# Patient Record
Sex: Male | Born: 1940
Health system: Southern US, Community
[De-identification: ages and names within clinical notes are randomized; demographics above are authoritative.]

## PROBLEM LIST (undated history)

## (undated) DIAGNOSIS — C801 Malignant (primary) neoplasm, unspecified: Secondary | ICD-10-CM

## (undated) DIAGNOSIS — M1990 Unspecified osteoarthritis, unspecified site: Secondary | ICD-10-CM

## (undated) DIAGNOSIS — E785 Hyperlipidemia, unspecified: Secondary | ICD-10-CM

## (undated) DIAGNOSIS — E119 Type 2 diabetes mellitus without complications: Secondary | ICD-10-CM

## (undated) DIAGNOSIS — I10 Essential (primary) hypertension: Secondary | ICD-10-CM

## (undated) DIAGNOSIS — N400 Enlarged prostate without lower urinary tract symptoms: Secondary | ICD-10-CM

## (undated) HISTORY — PX: PAROTID GLAND TUMOR EXCISION: SHX5221

## (undated) HISTORY — DX: Benign prostatic hyperplasia without lower urinary tract symptoms: N40.0

## (undated) HISTORY — DX: Type 2 diabetes mellitus without complications: E11.9

## (undated) HISTORY — DX: Hyperlipidemia, unspecified: E78.5

## (undated) HISTORY — DX: Malignant (primary) neoplasm, unspecified: C80.1

## (undated) HISTORY — PX: HERNIA REPAIR: SHX51

## (undated) HISTORY — DX: Essential (primary) hypertension: I10

## (undated) HISTORY — PX: ROTATOR CUFF REPAIR: SHX139

---

## 1998-04-14 ENCOUNTER — Emergency Department (HOSPITAL_COMMUNITY): Admission: EM | Admit: 1998-04-14 | Discharge: 1998-04-14 | Payer: Self-pay | Admitting: *Deleted

## 1999-06-18 ENCOUNTER — Emergency Department (HOSPITAL_COMMUNITY): Admission: EM | Admit: 1999-06-18 | Discharge: 1999-06-18 | Payer: Self-pay | Admitting: Emergency Medicine

## 1999-06-18 ENCOUNTER — Encounter: Payer: Self-pay | Admitting: Emergency Medicine

## 2002-07-12 ENCOUNTER — Other Ambulatory Visit: Admission: RE | Admit: 2002-07-12 | Discharge: 2002-07-12 | Payer: Self-pay | Admitting: Otolaryngology

## 2002-08-02 ENCOUNTER — Encounter: Admission: RE | Admit: 2002-08-02 | Discharge: 2002-08-02 | Payer: Self-pay | Admitting: Otolaryngology

## 2002-08-02 ENCOUNTER — Encounter: Payer: Self-pay | Admitting: Otolaryngology

## 2002-08-24 ENCOUNTER — Inpatient Hospital Stay (HOSPITAL_COMMUNITY): Admission: RE | Admit: 2002-08-24 | Discharge: 2002-08-26 | Payer: Self-pay | Admitting: Otolaryngology

## 2002-08-24 ENCOUNTER — Encounter (INDEPENDENT_AMBULATORY_CARE_PROVIDER_SITE_OTHER): Payer: Self-pay | Admitting: *Deleted

## 2002-09-04 ENCOUNTER — Ambulatory Visit: Admission: RE | Admit: 2002-09-04 | Discharge: 2002-09-29 | Payer: Self-pay | Admitting: Radiation Oncology

## 2002-09-07 ENCOUNTER — Encounter: Admission: RE | Admit: 2002-09-07 | Discharge: 2002-09-07 | Payer: Self-pay | Admitting: Dentistry

## 2002-10-19 ENCOUNTER — Ambulatory Visit: Admission: RE | Admit: 2002-10-19 | Discharge: 2002-12-13 | Payer: Self-pay | Admitting: Radiation Oncology

## 2003-05-14 ENCOUNTER — Ambulatory Visit: Admission: RE | Admit: 2003-05-14 | Discharge: 2003-05-14 | Payer: Self-pay | Admitting: Radiation Oncology

## 2003-07-16 ENCOUNTER — Ambulatory Visit: Admission: RE | Admit: 2003-07-16 | Discharge: 2003-07-16 | Payer: Self-pay | Admitting: Radiation Oncology

## 2003-09-10 ENCOUNTER — Ambulatory Visit: Admission: RE | Admit: 2003-09-10 | Discharge: 2003-09-10 | Payer: Self-pay | Admitting: Radiation Oncology

## 2003-10-03 ENCOUNTER — Encounter: Admission: RE | Admit: 2003-10-03 | Discharge: 2003-10-03 | Payer: Self-pay | Admitting: Otolaryngology

## 2004-03-25 ENCOUNTER — Ambulatory Visit: Admission: RE | Admit: 2004-03-25 | Discharge: 2004-03-25 | Payer: Self-pay

## 2004-07-31 ENCOUNTER — Ambulatory Visit: Payer: Self-pay | Admitting: Dentistry

## 2004-09-04 ENCOUNTER — Encounter: Admission: RE | Admit: 2004-09-04 | Discharge: 2004-09-04 | Payer: Self-pay | Admitting: Otolaryngology

## 2005-04-10 ENCOUNTER — Ambulatory Visit: Payer: Self-pay | Admitting: Dentistry

## 2005-04-22 ENCOUNTER — Ambulatory Visit: Payer: Self-pay | Admitting: Dentistry

## 2005-06-09 ENCOUNTER — Ambulatory Visit (HOSPITAL_COMMUNITY): Admission: RE | Admit: 2005-06-09 | Discharge: 2005-06-09 | Payer: Self-pay | Admitting: Otolaryngology

## 2005-10-30 ENCOUNTER — Ambulatory Visit: Payer: Self-pay | Admitting: Dentistry

## 2006-02-01 ENCOUNTER — Ambulatory Visit: Payer: Self-pay | Admitting: Dentistry

## 2006-05-28 ENCOUNTER — Ambulatory Visit: Payer: Self-pay | Admitting: Dentistry

## 2006-12-10 ENCOUNTER — Ambulatory Visit: Payer: Self-pay | Admitting: Dentistry

## 2008-06-19 ENCOUNTER — Ambulatory Visit: Payer: Self-pay | Admitting: Dentistry

## 2008-08-13 ENCOUNTER — Ambulatory Visit: Payer: Self-pay | Admitting: Dentistry

## 2008-08-15 ENCOUNTER — Encounter: Payer: Self-pay | Admitting: Family Medicine

## 2008-08-22 ENCOUNTER — Ambulatory Visit: Payer: Self-pay | Admitting: Family Medicine

## 2008-10-19 ENCOUNTER — Ambulatory Visit: Payer: Self-pay | Admitting: Family Medicine

## 2008-10-19 DIAGNOSIS — I10 Essential (primary) hypertension: Secondary | ICD-10-CM

## 2008-10-19 DIAGNOSIS — J301 Allergic rhinitis due to pollen: Secondary | ICD-10-CM

## 2008-10-19 DIAGNOSIS — I159 Secondary hypertension, unspecified: Secondary | ICD-10-CM | POA: Insufficient documentation

## 2008-10-19 DIAGNOSIS — N401 Enlarged prostate with lower urinary tract symptoms: Secondary | ICD-10-CM

## 2008-10-19 DIAGNOSIS — N400 Enlarged prostate without lower urinary tract symptoms: Secondary | ICD-10-CM | POA: Insufficient documentation

## 2008-10-19 HISTORY — DX: Essential (primary) hypertension: I10

## 2008-10-29 ENCOUNTER — Ambulatory Visit: Payer: Self-pay | Admitting: Family Medicine

## 2008-10-29 ENCOUNTER — Encounter: Payer: Self-pay | Admitting: Family Medicine

## 2008-11-05 LAB — CONVERTED CEMR LAB
ALT: 14 units/L (ref 0–53)
AST: 16 units/L (ref 0–37)
Albumin: 4.2 g/dL (ref 3.5–5.2)
CO2: 23 meq/L (ref 19–32)
Calcium: 9.3 mg/dL (ref 8.4–10.5)
Chloride: 105 meq/L (ref 96–112)
Cholesterol: 220 mg/dL — ABNORMAL HIGH (ref 0–200)
Creatinine, Ser: 1.13 mg/dL (ref 0.40–1.50)
Hemoglobin: 14.6 g/dL (ref 13.0–17.0)
MCHC: 33.9 g/dL (ref 30.0–36.0)
MCV: 85 fL (ref 78.0–100.0)
Platelets: 161 10*3/uL (ref 150–400)
Potassium: 4.3 meq/L (ref 3.5–5.3)
RBC: 5.07 M/uL (ref 4.22–5.81)
Sodium: 141 meq/L (ref 135–145)
Total Protein: 7.2 g/dL (ref 6.0–8.3)

## 2008-11-20 ENCOUNTER — Ambulatory Visit: Payer: Self-pay | Admitting: Family Medicine

## 2008-11-20 DIAGNOSIS — M1710 Unilateral primary osteoarthritis, unspecified knee: Secondary | ICD-10-CM

## 2008-11-20 DIAGNOSIS — N529 Male erectile dysfunction, unspecified: Secondary | ICD-10-CM | POA: Insufficient documentation

## 2008-11-20 DIAGNOSIS — E785 Hyperlipidemia, unspecified: Secondary | ICD-10-CM | POA: Insufficient documentation

## 2008-11-20 DIAGNOSIS — M159 Polyosteoarthritis, unspecified: Secondary | ICD-10-CM | POA: Insufficient documentation

## 2008-12-04 ENCOUNTER — Encounter: Payer: Self-pay | Admitting: Family Medicine

## 2008-12-17 ENCOUNTER — Emergency Department (HOSPITAL_COMMUNITY): Admission: EM | Admit: 2008-12-17 | Discharge: 2008-12-17 | Payer: Self-pay | Admitting: Emergency Medicine

## 2009-01-28 ENCOUNTER — Encounter: Payer: Self-pay | Admitting: Family Medicine

## 2009-01-30 ENCOUNTER — Encounter: Payer: Self-pay | Admitting: Family Medicine

## 2009-10-16 ENCOUNTER — Ambulatory Visit: Payer: Self-pay | Admitting: Dentistry

## 2010-07-09 ENCOUNTER — Encounter (INDEPENDENT_AMBULATORY_CARE_PROVIDER_SITE_OTHER): Payer: Self-pay | Admitting: *Deleted

## 2010-07-17 NOTE — Letter (Signed)
Summary: Generic Letter  [facility]  [address]   [place], [place] [address]   Phone: [phone]  Fax: [phone]    07/09/2010  [address] [place], [place]  [address]  Dear Edward Newton,  We are happy to let you know that since you are covered under Medicare you are able to have a FREE visit at the [facility] to discuss your HEALTH. This is a new benefit for Medicare.  There will be no co-payment.  At this visit you will meet with Arlys John an expert in wellness and the health coach at our clinic.  At this visit we will discuss ways to keep you healthy and feeling well.  This visit will not replace your regular doctor visit and we cannot refill medications.     You will need to plan to be here at least one hour to talk about your medical history, your current status, review all of your medications, and discuss your future plans for your health.  This information will be entered into your record for your doctor to have and review.  If you are interested in staying healthy, this type of visit can help.  Please call the office at: [phone], to schedule a "Medicare Wellness Visit".  The day of the visit you should bring in all of your medications, including any vitamins, herbs, over the counter products you take.  Make a list of all the other doctors that you see, so we know who they are. If you have any other health documents please bring them.  We look forward to helping you stay healthy.  Sincerely,   [name]  iAWV

## 2010-09-21 LAB — URINALYSIS, ROUTINE W REFLEX MICROSCOPIC
Bilirubin Urine: NEGATIVE
Hgb urine dipstick: NEGATIVE
Ketones, ur: NEGATIVE mg/dL
Specific Gravity, Urine: 1.011 (ref 1.005–1.030)
pH: 6 (ref 5.0–8.0)

## 2010-09-21 LAB — BASIC METABOLIC PANEL
CO2: 29 mEq/L (ref 19–32)
Calcium: 9.2 mg/dL (ref 8.4–10.5)
Chloride: 106 mEq/L (ref 96–112)
GFR calc Af Amer: >60 mL/min (ref >60)
Glucose, Bld: 91 mg/dL (ref 70–99)
Potassium: 4.2 mEq/L (ref 3.5–5.1)
Sodium: 140 mEq/L (ref 135–145)

## 2010-09-21 LAB — DIFFERENTIAL
Basophils Absolute: 0 10*3/uL (ref 0.0–0.1)
Basophils Relative: 0 % (ref 0–1)
Eosinophils Relative: 8 % — ABNORMAL HIGH (ref 0–5)
Lymphocytes Relative: 9 % — ABNORMAL LOW (ref 12–46)
Monocytes Absolute: 0.6 10*3/uL (ref 0.1–1.0)
Monocytes Relative: 7 % (ref 3–12)
Neutro Abs: 6.5 10*3/uL (ref 1.7–7.7)

## 2010-09-21 LAB — CBC
HCT: 41.9 % (ref 39.0–52.0)
Hemoglobin: 13.7 g/dL (ref 13.0–17.0)
MCHC: 32.7 g/dL (ref 30.0–36.0)
RBC: 4.74 MIL/uL (ref 4.22–5.81)
RDW: 15.9 % — ABNORMAL HIGH (ref 11.5–15.5)

## 2010-09-21 LAB — URINE MICROSCOPIC-ADD ON

## 2010-10-31 NOTE — Op Note (Signed)
NAME:  Edward Newton, Edward Newton                       ACCOUNT NO.:  [id number]   MEDICAL RECORD NO.:  [id number]                   PATIENT TYPE:  INP   LOCATION:  3306                                 FACILITY:  [facility]   PHYSICIAN:  Lucky Cowboy, M.D.                    DATE OF BIRTH:  07-07-1940   DATE OF PROCEDURE:  08/24/2002  DATE OF DISCHARGE:                                 OPERATIVE REPORT   PREOPERATIVE DIAGNOSIS:  Left parotid mass.   POSTOPERATIVE DIAGNOSIS:  Left parotid high-grade mucoepidermoid carcinoma.   PROCEDURES:  1. Left total parotidectomy.  2. Left facial nerve (frontal branch) neurorrhaphy.  3. Left selective neck dissection (supraomohyoid neck dissection, zones I-     III).  4. Nerve integrity monitoring.   SURGEON:  Lucky Cowboy, M.D.   ASSISTANTS:  Gloris Manchester. Lazarus Salines, M.D., and Jeannett Senior. Pollyann Kennedy, M.D.   ESTIMATED BLOOD LOSS:  250 mL.   INDICATIONS:  This patient is a 70 year old male with approximately a four-  month history of a left preauricular mass.  Fine needle aspiration in the  office revealed evidence of carcinoma most consistent with high-grade  mucoepidermoid carcinoma.  For this reason, the above procedures are  performed.   FINDINGS:  The patient was noted to have multiple intraparotid masses but  none definitely (grossly) involving the deep lobe.  There were also multiple  nodes, particularly one overlying the jugular posterior to the digastric  muscle in zone II.  Frozen section revealed high-grade mucoepidermoid  carcinoma and for this reason, neck dissection was performed.   DESCRIPTION OF PROCEDURE:  The patient was taken to the operating room and  placed on the table in the supine position.  He was then placed under  general endotracheal anesthesia and the table rotated clockwise 90 degrees.  The nerve integrity monitor was applied to the orbicularis oculi and the  orbicularis oris.  The left face was prepped with Betadine and draped in the  usual sterile fashion.  A modified Blair incision was made, carrying it down  through the neck approximately three fingerbreadths inferior to the  mandible.  The incision was made with a #15 blade through the skin and  subcutaneous tissues.  Bovie electrocautery was used to divide the  subcutaneous tissues and to take off the posterior portion of the parotid  gland from the underlying ear cartilage.  Continuing dissection was  performed inferiorly, releasing the tail of the parotid from the  sternocleidomastoid muscle and also the digastric muscles.  Dissection  continued in this manner down to the tragal pointer and mastoid tip.  The  tympanic rings were used to identify the area of the facial nerve coming out  of the stylomastoid foramen.  It was then traced anteriorly using care to  cut tissue superior to this and care not to cut the nerves.  Nerve integrity  monitoring was used throughout this  portion of the procedure.  Each branch  was dissected carefully and the overlying superficial gland dissected off.  Extensive dissection was required, and there were multiple small branches  which occurred quite distally.  The entire gland was taken out in this  fashion.  The three quarter of the frontal branch was transected in trying  to get the superior portion of the gland freed.  This was reapproximated  using an 8-0 Prolene stitch to reapproximate the perineurium of this portion  of the nerve.  Gelfoam was placed over this for security to avoid any  disruption at the end of the procedure.   The deep lobe was then dissected.  Multiple vessels, including the internal  maxillary artery and vein, were divided and tied with 3-0 silk suture.  Silk  3-0 and 4-0 silk suture were used throughout the dissection of both the neck  and the parotid to ensure hemostasis.  Bipolar cautery was also used  significantly, particularly around the facial nerve, both at the main trunk  and branches.  The nerve  was then dissected off using a #15 blade from the  underlying deep lobe.  The deep lobe was then taken off the masseter muscle.  It was sent as a separate specimen.  As noted above, the primary tumor was  returned as high-grade mucoepidermoid carcinoma.   The neck flaps were then raised in a subplatysmal plane superiorly.  The  marginal mandibular nerve was traced out anteriorly and protected.  It was  separated off of the submandibular gland in this manner.  The submandibular  gland was divided from the inferior portion of the mandible using Bovie  cautery.  It was taken down inferiorly in this manner.  The mylohyoid muscle  was retracted anteriorly and the vascular pedicle divided.  The lingual  nerve was identified and cut inferior to Langley's ganglion.  The  hypoglossal nerve was identified and protected.  Wharton's duct was  identified, divided, and tied off.  The gland was thus separated and  included with the submental fat down to the underlying musculature.  Posteriorly, the fascia overlying the sternocleidomastoid muscle was  dissected anteriorly.  The spinal accessory nerve was identified and  protected.  The triangle right above the spinal accessory nerve was  dissected, and there was found to be an obvious node in this region.  The  overlying fascia was then taken down to the prevertebral musculature, down  to the carotid sheath.  Inferiorly, dissection continued off of the  posterior triangle down to the cervical cutaneous nerves.  Dissection was  just above this level down to the level of the omohyoid muscle.  Bovie  cautery was used for this.  The fascia overlying the carotid artery was  taken down using dissection and Bovie cautery.  A #10 blade was used to  release the overlying fascia from the internal jugular vein.  The vagus  nerves and phrenic nerves were identified and protected.  Anteriorly, the fascia and lymph nodes were taken off of the omohyoid muscle and  anterior  strap muscle.  Continued dissection occurred superiorly, releasing this from  the fascia overlying the larynx.  In this way, the left neck contents were  removed.  The wounds were copiously irrigated with normal saline, which was  suctioned out.  Gelfoam was placed over portions of the facial nerve.  A #15  Heide Spark drain was placed in the depths of the wound and secured to the  digastric muscle and  strap muscle anteriorly in a simple interrupted fashion  using 3-0 Vicryl.  It was then secured to the skin after making a separate  stab incision.  The secure portion was made in a pursestring fashion using 3-  0 nylon.  The drain was connected to suction.  The subcutaneous tissues were  reapproximated in simple interrupted buried fashion in the preauricular area  using 4-0 Vicryl.  The neck was closed by reapproximating the platysma in a  simple interrupted buried fashion using 3-0 Vicryl.  Inferiorly in the neck,  staples were used to close the skin, and superiorly in the parotid area the  skin was closed in a running fashion using 5-0 Prolene.  Bacitracin ointment  was applied.  The patient was awakened from anesthesia and taken to the  postanesthesia care unit in stable condition.                                               Lucky Cowboy, M.D.    SJ/MEDQ  D:  08/25/2002  T:  08/25/2002  Job:  [id number]   cc:   Dr. Claude Manges [facility]

## 2010-12-25 ENCOUNTER — Ambulatory Visit (HOSPITAL_COMMUNITY): Payer: Self-pay | Admitting: Dentistry

## 2010-12-25 DIAGNOSIS — Z85819 Personal history of malignant neoplasm of unspecified site of lip, oral cavity, and pharynx: Secondary | ICD-10-CM

## 2010-12-26 ENCOUNTER — Ambulatory Visit (INDEPENDENT_AMBULATORY_CARE_PROVIDER_SITE_OTHER): Payer: Self-pay | Admitting: Family Medicine

## 2010-12-26 ENCOUNTER — Encounter: Payer: Self-pay | Admitting: Family Medicine

## 2010-12-26 VITALS — BP 122/78 | HR 91 | Temp 98.2°F | Ht 69.0 in | Wt 179.9 lb

## 2010-12-26 DIAGNOSIS — R030 Elevated blood-pressure reading, without diagnosis of hypertension: Secondary | ICD-10-CM

## 2010-12-26 DIAGNOSIS — E7800 Pure hypercholesterolemia, unspecified: Secondary | ICD-10-CM

## 2010-12-26 DIAGNOSIS — N529 Male erectile dysfunction, unspecified: Secondary | ICD-10-CM

## 2010-12-26 DIAGNOSIS — Z23 Encounter for immunization: Secondary | ICD-10-CM

## 2010-12-26 MED ORDER — SILDENAFIL CITRATE 25 MG PO TABS: 25.0000 mg | ORAL_TABLET | ORAL | Status: DC | PRN

## 2010-12-26 NOTE — Assessment & Plan Note (Signed)
Recheck today.  Will send letter or call to discuss

## 2010-12-26 NOTE — Assessment & Plan Note (Signed)
Good today.  Will monitor

## 2010-12-26 NOTE — Assessment & Plan Note (Signed)
Healthy enough for sexual activity.  Refilled viagra.  Encouraged him to talk to his urologist about this as well.

## 2010-12-26 NOTE — Patient Instructions (Signed)
It was nice to meet you today!  Please make an appt for lab work in the AM Come without eating or drinking except for black coffee or water   I will send you a letter with the results unless we need to talk about them.    I have refilled your viagra.  Its at your pharmacy

## 2010-12-26 NOTE — Progress Notes (Signed)
  Subjective:    Patient ID: Edward [facility], male    DOB: Apr 13, 1941, 70 y.o.   MRN: [id number]  HPI  CPE- no concerns, no complaints.  Eating varied diet with vegetables and fruits, avoiding fried foods.  Exercise with walking and push ups.  No chest pain, DOE, SOB, HA, weight loss or fevers.  Can walk as far as he wants to.    Review of Systems See above    Objective:   Physical Exam Vital signs reviewed General appearance - alert, well appearing, and in no distress and oriented to person, place, and time Heart - normal rate, regular rhythm, normal S1, S2, no murmurs, rubs, clicks or gallops Chest - clear to auscultation, no wheezes, rales or rhonchi, symmetric air entry, no tachypnea, retractions or cyanosis Skin - normal coloration and turgor, no rashes, no suspicious skin lesions noted Eyes - pupils equal and reactive, extraocular eye movements intact, sclera anicteric, some cataracts seen on left eye Ears - bilateral TM's and external ear canals normal, right ear normal, left ear normal Nose - normal and patent, no erythema, discharge or polyps Face- small indentation near ear s/p surgery Abdomen - soft, nontender, nondistended, no masses or organomegaly Extremities - peripheral pulses normal, no pedal edema, no clubbing or cyanosis Right hand missing last three tips of fingers s/p work injury       Assessment & Plan:

## 2010-12-29 ENCOUNTER — Other Ambulatory Visit: Payer: BC Managed Care – PPO

## 2010-12-29 DIAGNOSIS — E7800 Pure hypercholesterolemia, unspecified: Secondary | ICD-10-CM

## 2010-12-29 LAB — COMPREHENSIVE METABOLIC PANEL
Alkaline Phosphatase: 52 U/L (ref 39–117)
BUN: 13 mg/dL (ref 6–23)
CO2: 24 mEq/L (ref 19–32)
Creat: 1.29 mg/dL (ref 0.50–1.35)
Glucose, Bld: 122 mg/dL — ABNORMAL HIGH (ref 70–99)
Total Bilirubin: 0.7 mg/dL (ref 0.3–1.2)

## 2010-12-29 LAB — LIPID PANEL
Cholesterol: 176 mg/dL (ref 0–200)
HDL: 41 mg/dL
LDL Cholesterol: 121 mg/dL — ABNORMAL HIGH (ref 0–99)
Total CHOL/HDL Ratio: 4.3 ratio
Triglycerides: 72 mg/dL
VLDL: 14 mg/dL (ref 0–40)

## 2010-12-29 NOTE — Progress Notes (Signed)
Cmp,flp done today [facility] Nyree Yonker

## 2010-12-30 ENCOUNTER — Encounter: Payer: Self-pay | Admitting: Family Medicine

## 2010-12-31 ENCOUNTER — Encounter: Payer: Self-pay | Admitting: Family Medicine

## 2011-01-09 ENCOUNTER — Encounter: Payer: Self-pay | Admitting: Home Health Services

## 2011-01-09 ENCOUNTER — Ambulatory Visit (INDEPENDENT_AMBULATORY_CARE_PROVIDER_SITE_OTHER): Payer: BC Managed Care – PPO | Admitting: Home Health Services

## 2011-01-09 VITALS — BP 157/94 | HR 78 | Temp 98.4°F | Ht 69.0 in | Wt 179.4 lb

## 2011-01-09 DIAGNOSIS — Z0000 Encounter for general adult medical examination without abnormal findings: Secondary | ICD-10-CM

## 2011-01-09 NOTE — Patient Instructions (Signed)
1. Continue to exercise 3-4 times a week for 30 minutes. 2. Continue to focus your diet on fruits and vegetables. 3. Schedule an eye exam. 4. Consider contacting pharmacy for shingles vaccine. 5. Continue to watch blood pressure and if it is consistently over 140/90, schedule an appointment with Dr. Hulen Luster.

## 2011-01-09 NOTE — Progress Notes (Signed)
Patient here for annual wellness visit, patient reports: Risk Factors/Conditions needing evaluation or treatment: Pt does not have risk factors that need evaluation. Home Safety: Pt lives with wife in 2 story home.  PT reports having smoke detectors but does not have adaptive equipment in bathroom.  Other Information: Corrective lens: Pt wears corrective lens for reading and visits eye doctor as needed. Dentures: Pt has full dentures on top and partial on the bottom. Memory: Pt denies memory problems. Patient's Mini Mental Score (recorded in doc. flowsheet): 30  Balance/Gait: Pt has no noticable impairment Balance Abnormal Patient value  Sitting balance    Sit to stand    Attempts to arise    Immediate standing balance    Standing balance    Nudge    Eyes closed- Romberg    Tandem stance    Back lean    Neck Rotation    360 degree turn    Sitting down     Gait Abnormal Patient value  Initiation of gait    Step length-left    Step length-right    Step height-left    Step height-right    Step symmetry    Step continuity    Path deviation    Trunk movement    Walking stance        Annual Wellness Visit Requirements Recorded Today In  Medical, family, social history Past Medical, Family, Social History Section  Current providers Care team  Current medications Medications  Wt, BP, Ht, BMI Vital signs  Hearing assessment (welcome visit) Hearing/vision  Tobacco, alcohol, illicit drug use History  ADL Nurse Assessment  Depression Screening Nurse Assessment  Cognitive impairment Nurse Assessment  Mini Mental Status Document Flowsheet  Fall Risk Nurse Assessment  Home Safety Progress Note  End of Life Planning (welcome visit) Social Documentation  Medicare preventative services Progress Note  Risk factors/conditions needing evaluation/treatment Progress Note  Personalized health advice Patient Instructions, goals, letter  Diet & Exercise Social Documentation    Emergency Contact Social Documentation  Seat Belts Social Documentation  Sun exposure/protection Social Documentation    Medicare Prevention Plan: Recommended pt  Contact pharmacy for shingles vaccine.  Recommended Medicare Prevention Screenings Men over 51 Test For Frequency Date of Last- BOLD if needed  Colorectal Cancer 1-10 yrs 2010- pt reported  Prostate Cancer Never or yearly Under care of urologist  Aortic Aneurysm Once if 65-75 with hx of smoking   Cholesterol 5 yrs 7/12  Diabetes yearly Non diabetic  HIV yearly declined  Influenza Shot yearly declined  Pneumonia Shot once 7/12  Zostavax Shot once recommended

## 2011-01-12 ENCOUNTER — Encounter: Payer: Self-pay | Admitting: Home Health Services

## 2011-01-12 NOTE — Progress Notes (Signed)
I have reviewed this visit and discussed with Suzanne Lineberry and agree with her documentation  

## 2011-04-22 ENCOUNTER — Encounter: Payer: Self-pay | Admitting: Home Health Services

## 2011-06-24 ENCOUNTER — Ambulatory Visit (HOSPITAL_COMMUNITY): Payer: BC Managed Care – PPO | Admitting: Dentistry

## 2011-08-03 ENCOUNTER — Encounter (HOSPITAL_COMMUNITY): Payer: Self-pay | Admitting: Dentistry

## 2011-08-03 ENCOUNTER — Ambulatory Visit (HOSPITAL_COMMUNITY): Payer: Self-pay | Admitting: Dentistry

## 2011-08-03 DIAGNOSIS — M264 Malocclusion, unspecified: Secondary | ICD-10-CM

## 2011-08-03 DIAGNOSIS — K121 Other forms of stomatitis: Secondary | ICD-10-CM

## 2011-08-03 DIAGNOSIS — M270 Developmental disorders of jaws: Secondary | ICD-10-CM

## 2011-08-03 DIAGNOSIS — K1370 Unspecified lesions of oral mucosa: Secondary | ICD-10-CM

## 2011-08-03 DIAGNOSIS — K08109 Complete loss of teeth, unspecified cause, unspecified class: Secondary | ICD-10-CM

## 2011-08-03 DIAGNOSIS — K089 Disorder of teeth and supporting structures, unspecified: Secondary | ICD-10-CM

## 2011-08-03 DIAGNOSIS — Z923 Personal history of irradiation: Secondary | ICD-10-CM

## 2011-08-03 DIAGNOSIS — K036 Deposits [accretions] on teeth: Secondary | ICD-10-CM

## 2011-08-03 DIAGNOSIS — Z09 Encounter for follow-up examination after completed treatment for conditions other than malignant neoplasm: Secondary | ICD-10-CM

## 2011-08-03 DIAGNOSIS — Z85819 Personal history of malignant neoplasm of unspecified site of lip, oral cavity, and pharynx: Secondary | ICD-10-CM

## 2011-08-03 DIAGNOSIS — K029 Dental caries, unspecified: Secondary | ICD-10-CM

## 2011-08-03 DIAGNOSIS — K000 Anodontia: Secondary | ICD-10-CM

## 2011-08-04 NOTE — Progress Notes (Signed)
Monday, August 03, 2011   BP: 117/74           P: 78            T: 97.5   Edward [facility] presents for periodic oral exam and adult prophylaxis. Past Medical History  Diagnosis Date  . Hyperlipidemia   . BPH (benign prostatic hyperplasia)   . Cancer     left parotid; S/P Left Total Parotidectomy with seelctive neck dissection 08/24/02-Dr. Gerilyn Pilgrim; S/P Radiation therapy 10/26/02 thru 12/11/02 6600cGy  Patient denies any recent health history changes. Patient sees Designer, fashion/clothing residents with [facility] on the [facility]. .. No Known Allergies Current Outpatient Prescriptions  Medication Sig Dispense Refill  . JALYN 0.5-0.4 MG CAPS       . sildenafil (VIAGRA) 25 MG tablet Take 1 tablet (25 mg total) by mouth as needed for erectile dysfunction. do not exceed more than one tablet a day.  10 tablet  11    C/C: "I have a sore spot on my lower gum".  HPI: Patient indicates that he recently last lost his lower partial denture. He has been eating with the upper denture against his lower teeth and and now has a sore spot on the lower left side in the back where the denture hits.  DENTAL EXAM  General: Patient is a well-developed, well-nourished male in no acute distress. Vitals: As above. Extraoral Exam: Left neck is consistent with previous neck dissection and parotidectomy. There is no right neck lymphadenopathy. The patient denies any acute TMJ symptoms. Intraoral Exam: Xerostomia. There is a soft tissue ulceration in the area of trauma from the upper denture in the left retromolar pad area. Patient has bilateral mandibular lingual tori. Dentition: Patient has tooth numbers 22 through 29 remaining. Caries: Patient has caries involving tooth numbers 22 through 27.  Please see dental charting form. Patient has significant facial and lingual abfraction lesions. Periodontal; chronic periodontal disease with accretions. Adult prophylaxis performed with KaVo Sonic scaler and hand curettes.  Oral Hygiene instructions provided. Endodontic: Patient denies acute pulpitis symptoms . There are no obvious periapical radiolucencies noted. C&B: There are no crown restorations. Prosthodontic: Patient has an upper complete denture that is acceptable. The tuberosity areas of the upper denture are traumatizing the retromolar pad area due to loss of the recent lower partial denture. Pressure indicating paste was applied and the denture was adjusted as indicated to avoid trauma to these lower left and lower right retromolar pad areas. Areas were polished. Patient accepted results. Occlusion: The patient has a less than ideal occlusion at this time. Radiographic interpretation:(panoramic radiograph and lower Periapicals were obtained) Multiple dental caries are noted. Multiple missing teeth are noted. No obvious periapical radiolucencies are noted.  Assessments: 1. Traumatic ulceration of lower left retromolar pad area. 2. Chronic periodontitis 3. Accretions 4. Dental caries 5. Xerostomia 6. Bilateral mandibular lingual tori 7. Decreased maximum interincisal opening 8. Loss of mandibular partial denture 9. Poor occlusal scheme 10. Extensive facial abfraction lesions 11. Extensive lingual abfraction/erosion lesions 12. History of previous radiation therapy with risk for osteonecrosis to the radiation therapy areas.  Plan/Recommendations: 1. I discussed the risks, benefits, and complications of various treatment options with the patient in relationship to his medical and dental conditions and previous radiation therapy. We discussed extraction of remaining teeth with alveoloplasty and pre-prosthetic surgery as indicated. With this previous radiation therapy, the patient would most likely be at risk for osteoradionecrosis when tori reductions are performed. I will, however,  review this with Dr. Dayton Scrape to see if the tori are in the field of radiation therapy. We then discussed fabrication of a  mandibular partial denture after caries excavations. Patient is aware of the possible need for root canal therapies that may need to be performed if caries extend into the pulps. Patient is currently thinking about his options at this time and will return for discussion of final treatment options in approximately 2 weeks. 2. Discussion of findings with Dr. Chipper Herb as indicated. Dr. Kristin Bruins

## 2011-08-14 ENCOUNTER — Encounter (HOSPITAL_COMMUNITY): Payer: Self-pay | Admitting: Dentistry

## 2011-08-17 ENCOUNTER — Ambulatory Visit (HOSPITAL_COMMUNITY): Payer: Self-pay | Admitting: Dentistry

## 2011-08-17 DIAGNOSIS — M264 Malocclusion, unspecified: Secondary | ICD-10-CM

## 2011-08-17 DIAGNOSIS — K029 Dental caries, unspecified: Secondary | ICD-10-CM

## 2011-08-17 DIAGNOSIS — K117 Disturbances of salivary secretion: Secondary | ICD-10-CM

## 2011-08-17 DIAGNOSIS — R252 Cramp and spasm: Secondary | ICD-10-CM

## 2011-08-17 DIAGNOSIS — K000 Anodontia: Secondary | ICD-10-CM

## 2011-08-17 DIAGNOSIS — K08109 Complete loss of teeth, unspecified cause, unspecified class: Secondary | ICD-10-CM

## 2011-08-17 NOTE — Progress Notes (Signed)
Monday, August 17, 2011   BP: 116/76            P:  82           T: 97.8  [name] presents for discussion of treatment options and initiation of treatment as time permits. We had an extensive discussion on full mouth extractions with alveoloplasty and pre-prosthetic surgery and potential risks for osteoradionecrosis. I had reviewed ports with Dr. Dayton Scrape who felt that risks for ORN should be minimal but no guarantees would be able to be provided. We discussed caries excavations, possible root canal therapies, selective extractions of 23-26,  and poor prognosis for successful lower partial fabrication due to the decreased vertical dimension and presence of mandibular tori. Patient currently wishes to proceed with caries excavations and dental restorations and subsequent discussion of partial denture fabrication after that phase of treatment.  Procedure: 36 mg Lidocaine with epi .018 mg via mental nerve block and infiltration. Z6109 #22 MF/Resin AE, Primer, DBA, Tetric Ceram EVO Shade A4. [place]. U0454 #23 L/Resin AE,Primer, DBA, Tetric Ceram EVO Shade A4. [place]. U9811 #23 DL/Resin AE, Primer, DBA, Tetric Ceram EVO Shade A4. [place]. [id number] #24 L/Resin AE,Primer, DBA, Tetric Ceram EVO Shade A4. [place]. Mandibular alginate impression. Lab pour. Patient tolerated procedure well. RTC for resins on tooth #'s 24-27 next time. Call if problems arise.  Dr. Cindra Eves

## 2011-08-31 ENCOUNTER — Ambulatory Visit (HOSPITAL_COMMUNITY): Payer: Self-pay | Admitting: Dentistry

## 2011-08-31 ENCOUNTER — Encounter: Payer: Self-pay | Admitting: Family Medicine

## 2011-08-31 ENCOUNTER — Ambulatory Visit (INDEPENDENT_AMBULATORY_CARE_PROVIDER_SITE_OTHER): Payer: BC Managed Care – PPO | Admitting: Family Medicine

## 2011-08-31 ENCOUNTER — Encounter (HOSPITAL_COMMUNITY): Payer: Self-pay | Admitting: Dentistry

## 2011-08-31 ENCOUNTER — Other Ambulatory Visit: Payer: Self-pay | Admitting: Family Medicine

## 2011-08-31 VITALS — BP 138/90 | HR 84 | Temp 98.4°F | Ht 69.0 in | Wt 186.0 lb

## 2011-08-31 DIAGNOSIS — K08109 Complete loss of teeth, unspecified cause, unspecified class: Secondary | ICD-10-CM

## 2011-08-31 DIAGNOSIS — N529 Male erectile dysfunction, unspecified: Secondary | ICD-10-CM

## 2011-08-31 DIAGNOSIS — K117 Disturbances of salivary secretion: Secondary | ICD-10-CM

## 2011-08-31 DIAGNOSIS — M264 Malocclusion, unspecified: Secondary | ICD-10-CM

## 2011-08-31 DIAGNOSIS — K029 Dental caries, unspecified: Secondary | ICD-10-CM

## 2011-08-31 NOTE — Progress Notes (Signed)
  Subjective:    Patient ID: Edward Newton, male    DOB: 09-04-1940, 71 y.o.   MRN: [id number]  HPI Patient here to talk about erectile dysfunction. He is currently taking Jalyn with his urologist.  He tried a low dose of Viagra several times about 8 months ago and this was ineffective. He denied any shortness of breath, chest pain, dizziness with this. He is going to see his urologist on Wednesday of this week. He has been unable to have intercourse since last year.  Review of Systems See above    Objective:   Physical Exam Vital signs reviewed General appearance - alert, well appearing, and in no distress and oriented to person, place, and time        Assessment & Plan:

## 2011-08-31 NOTE — Progress Notes (Signed)
Monday, August 31, 2011  BP: 149/84       P: 87           T: 98.2  Edward Newton presents for continued dental restorations. Patient denies any toothache symptoms from last dental treatment.   Procedure: 36 mg Lidocaine with epi .018 mg via mental nerve block and infiltration area #25, 26 and 27. E4540 #27 DF/Resin AE, Primer, DBA, Tetric EvoCeram Bulk fill IV A. Deep caries on distal and wanted self-cure in proximal box area. Contour and [place]. J8119 #26 L/Resin AE,Primer, DBA, Tetric EvoCeram A 4.0. Contour and polish. J4782 #24 L/Resin AE,Primer, DBA, Tetric Evoceram Shade A 4.0 Contour and [place]. Patient tolerated procedure well. Discussed possibility of future acute pulpitis symptoms and need for either extraction of root canal therapy. Patient to call if symptoms arise. Discussed poor prognosis for partial denture fabrication. Patient wants to proceed without pre-prosthetic surgery however. RTC for partial denture mouth modifications and impressions in three weeks if no toothache symptoms. Patient dismissed in stable condition and is to call as needed.   Dr. Cindra Eves

## 2011-08-31 NOTE — Patient Instructions (Signed)
There are many medicines and other treatments for this that your urologist can help you with Please stop taking the Viagra and see your urologist on Wednesday.  Please come back and see [place] in a few months for your physical

## 2011-08-31 NOTE — Assessment & Plan Note (Signed)
Patient has not been taking Viagra. He would like to have further treatments for erectile dysfunction. I asked him to talk about this with his urologist on Wednesday. If his urologist would like me to prescribe the medicine, he just needs to call with the information.

## 2012-01-12 ENCOUNTER — Encounter: Payer: Self-pay | Admitting: Home Health Services

## 2012-07-06 ENCOUNTER — Ambulatory Visit (INDEPENDENT_AMBULATORY_CARE_PROVIDER_SITE_OTHER): Payer: Medicare Other | Admitting: Family Medicine

## 2012-07-06 ENCOUNTER — Encounter: Payer: Self-pay | Admitting: Family Medicine

## 2012-07-06 VITALS — BP 168/94 | HR 91 | Temp 98.4°F | Ht 69.0 in | Wt 186.0 lb

## 2012-07-06 DIAGNOSIS — N529 Male erectile dysfunction, unspecified: Secondary | ICD-10-CM

## 2012-07-06 DIAGNOSIS — E785 Hyperlipidemia, unspecified: Secondary | ICD-10-CM

## 2012-07-06 DIAGNOSIS — N401 Enlarged prostate with lower urinary tract symptoms: Secondary | ICD-10-CM

## 2012-07-06 DIAGNOSIS — R030 Elevated blood-pressure reading, without diagnosis of hypertension: Secondary | ICD-10-CM

## 2012-07-06 DIAGNOSIS — H9190 Unspecified hearing loss, unspecified ear: Secondary | ICD-10-CM

## 2012-07-06 DIAGNOSIS — E7800 Pure hypercholesterolemia, unspecified: Secondary | ICD-10-CM

## 2012-07-06 HISTORY — DX: Unspecified hearing loss, unspecified ear: H91.90

## 2012-07-06 MED ORDER — TAMSULOSIN HCL 0.4 MG PO CAPS: 0.4000 mg | ORAL_CAPSULE | Freq: Every day | ORAL | Status: DC

## 2012-07-06 MED ORDER — TADALAFIL 20 MG PO TABS: 10.0000 mg | ORAL_TABLET | ORAL | Status: DC | PRN

## 2012-07-06 MED ORDER — FINASTERIDE 5 MG PO TABS: 5.0000 mg | ORAL_TABLET | Freq: Every day | ORAL | Status: DC

## 2012-07-06 NOTE — Assessment & Plan Note (Signed)
Viagra did not help. Try Cialis. Consider referring for physical therapy at Urology if he is interested and if Cialis does not help.

## 2012-07-06 NOTE — Assessment & Plan Note (Signed)
His blood pressure is elevated today. I advised he check periodically at the pharmacy and if consistently elevated, make an appointment with Dr. Raymondo Band for ambulatory blood pressure monitoring.

## 2012-07-06 NOTE — Progress Notes (Signed)
  Subjective:    Patient ID: Edward Newton, male    DOB: 06/05/1941, 72 y.o.   MRN: [id number]  HPI # Annual physical Denies blood in stool. Last colonoscopy 3-4 years ago, told normal  # BPH He is followed at [facility]  # Elevated blood pressures ROS: denies chest pain, difficulty breathing  # Erectile dysfunction He has difficulty "getting hard". Started 6 months ago Viagra did not help   # Problems hearing out of his left ear He had surgery and cancer removed anterior to his left ear a long time ago (?partoid gland) including radiation  He has noticed difficulty hearing out of that ear recently  Review of Systems Per HPI Denies constipation Denies leg swelling Endorses nasal congestion; denies fevers/chills     Objective:   Physical Exam Gen: NAD; well-appearing, -nourished PSYCH: pleasant, engaged and normally conversant, appropriate to questions, alert and oriented CV: RRR, normal S1/S2, no m/r/g PULM: NI WOB; CTAB without w/r/r EAR: TM normal on right, left could not be visualized due to ear wax that is partially obstructing canal; canals are non-tender NOSE: no rhinorrhea HEAD: he is missing parotid gland on left side; skin is non-tender, -erythematous, -warm NECK: no LAD EXT: no edema    Assessment & Plan:

## 2012-07-06 NOTE — Assessment & Plan Note (Signed)
Lab visit for cholesterol test

## 2012-07-06 NOTE — Patient Instructions (Addendum)
We will refer you to an audiologist If you don't hear from anyone in 1 week regarding this, please call our clinic and let me know.   Try Cialis to help with intercourse If this does not work, and you would like to try physical therapy at [facility], please let me know.   Make a lab appointment to check your cholesterol   Please get your blood pressure checked at the pharmacy periodically.  If consistently > 160/90, please make an appointment with Dr. Raymondo Band at our clinic for ambulatory blood pressure monitoring.   I hope you enjoy your retirement. It was nice to meet you today.  Follow-up in 1 year.

## 2012-07-06 NOTE — Assessment & Plan Note (Signed)
He is followed by Dr. Laverle Patter at [facility] and being treated medically and being seen every 6 months, next appointment June 2014.

## 2012-07-06 NOTE — Assessment & Plan Note (Signed)
Refer to audiology. 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

## 2012-07-12 ENCOUNTER — Other Ambulatory Visit: Payer: Medicare Other

## 2012-07-12 ENCOUNTER — Telehealth: Payer: Self-pay | Admitting: Family Medicine

## 2012-07-12 DIAGNOSIS — E785 Hyperlipidemia, unspecified: Secondary | ICD-10-CM

## 2012-07-12 LAB — LIPID PANEL
Cholesterol: 199 mg/dL (ref 0–200)
HDL: 43 mg/dL (ref >39)
Total CHOL/HDL Ratio: 4.6 Ratio
Triglycerides: 73 mg/dL (ref <150)
VLDL: 15 mg/dL (ref 0–40)

## 2012-07-12 NOTE — Progress Notes (Signed)
FLP DONE TODAY Edward Newton 

## 2012-07-12 NOTE — Telephone Encounter (Signed)
Telephone. Framingham risk 21%. His LDL is 141, borderline high. His risk factors include age and hypertension.  We discussed risks and benefits of starting statin.  -He will try lifestyle modification first. Re-check in 6 months.  -He is not on anti-hypertensives. He was asked to schedule nurse visit next week for BP check. If remains elevated, consider starting anti-hypertensive. I will call and discuss after BP check.

## 2012-07-19 ENCOUNTER — Ambulatory Visit (INDEPENDENT_AMBULATORY_CARE_PROVIDER_SITE_OTHER): Payer: Medicare Other | Admitting: *Deleted

## 2012-07-19 DIAGNOSIS — R030 Elevated blood-pressure reading, without diagnosis of hypertension: Secondary | ICD-10-CM

## 2012-07-19 NOTE — Progress Notes (Signed)
Patient office for BP check as directed. BP checked manually using regular adult cuff.   BP LA 160/82 and RA 166/88 pulse 86. States he is feeling well today . Will forward message to Dr. Madolyn Frieze .     Pharmacy is [facility]

## 2012-07-21 ENCOUNTER — Telehealth: Payer: Self-pay | Admitting: Family Medicine

## 2012-07-21 DIAGNOSIS — I10 Essential (primary) hypertension: Secondary | ICD-10-CM

## 2012-07-21 MED ORDER — LISINOPRIL 10 MG PO TABS: 10.0000 mg | ORAL_TABLET | Freq: Every day | ORAL | Status: DC

## 2012-07-21 NOTE — Telephone Encounter (Signed)
Message copied by [facility], Etta Quill on Thu Jul 21, 2012  3:27 PM ------      Message from: Salomon Mast      Created: Tue Jul 19, 2012 11:11 AM       Patient in for BP check today

## 2012-07-21 NOTE — Assessment & Plan Note (Signed)
Patient's blood pressure remains elevated at nurse blood pressure check. Discussed with patient. We will start anti-hypertensive. Lisinopril. Discussed side effects (notably angioedema). Follow-up in 1 month.

## 2012-07-26 ENCOUNTER — Telehealth: Payer: Self-pay | Admitting: Family Medicine

## 2012-07-26 MED ORDER — HYDROCHLOROTHIAZIDE 25 MG PO TABS: ORAL_TABLET | ORAL | Status: DC

## 2012-07-26 NOTE — Telephone Encounter (Signed)
His lips got really swollen after several days of taking lisinopril. He has stopped and his lips are now normal.   Will add to allergy list.   Change to HCTZ. Take 1/2 tablet in the AM for 3 days. If you tolerate it without lightheadedness or other problem, increase to 1 full tablet.  Follow-up in 2 weeks.

## 2012-07-26 NOTE — Telephone Encounter (Signed)
Patient is calling because he was just started on Lisinopril last week and he took for a few days, but then it caused his lips to swell so he has stopped taking it for the time being.  He needs to know what he needs to do.

## 2012-07-26 NOTE — Telephone Encounter (Signed)
MD in clinic this afternoon.  Will forward to her. [place], Maryjo Rochester

## 2012-08-23 ENCOUNTER — Ambulatory Visit (INDEPENDENT_AMBULATORY_CARE_PROVIDER_SITE_OTHER): Payer: Medicare Other | Admitting: Family Medicine

## 2012-08-23 ENCOUNTER — Encounter: Payer: Self-pay | Admitting: Family Medicine

## 2012-08-23 ENCOUNTER — Other Ambulatory Visit: Payer: Self-pay | Admitting: *Deleted

## 2012-08-23 VITALS — BP 120/78 | HR 92 | Temp 98.5°F | Ht 69.0 in | Wt 182.0 lb

## 2012-08-23 DIAGNOSIS — I10 Essential (primary) hypertension: Secondary | ICD-10-CM

## 2012-08-23 MED ORDER — HYDROCHLOROTHIAZIDE 25 MG PO TABS: 25.0000 mg | ORAL_TABLET | Freq: Every day | ORAL | Status: DC

## 2012-08-23 NOTE — Patient Instructions (Addendum)
Continue your blood pressure medicine   Follow-up in 6 months for blood pressure   If your lab results are normal, I will send you a letter with the results. If abnormal, someone at the clinic will get in touch with you.

## 2012-08-23 NOTE — Assessment & Plan Note (Signed)
Controlled on HCTZ 25 mg qd. Continue. Will check Cr, electrolytes today.

## 2012-08-23 NOTE — Progress Notes (Signed)
  Subjective:    Patient ID: Edward Newton, male    DOB: 1941/05/11, 72 y.o.   MRN: [id number]  HPI # Hypertension  Compliant with HCTZ ROS: denies muscle aches, urinary frequency  Review of Systems Per HPI Denies chest pain, dyspnea    Objective:   Physical Exam GEN: NAD PULM: NI WOB EXT: no pitting edema     Assessment & Plan:

## 2012-08-24 LAB — BASIC METABOLIC PANEL
BUN: 16 mg/dL (ref 6–23)
Calcium: 10 mg/dL (ref 8.4–10.5)
Chloride: 104 mEq/L (ref 96–112)
Creat: 1.23 mg/dL (ref 0.50–1.35)

## 2012-08-27 ENCOUNTER — Encounter: Payer: Self-pay | Admitting: Family Medicine

## 2012-12-26 ENCOUNTER — Ambulatory Visit (INDEPENDENT_AMBULATORY_CARE_PROVIDER_SITE_OTHER): Payer: Medicare Other | Admitting: Family Medicine

## 2012-12-26 ENCOUNTER — Encounter: Payer: Self-pay | Admitting: Family Medicine

## 2012-12-26 VITALS — BP 133/83 | HR 88 | Temp 98.1°F | Ht 69.0 in | Wt 180.0 lb

## 2012-12-26 DIAGNOSIS — M25519 Pain in unspecified shoulder: Secondary | ICD-10-CM

## 2012-12-26 NOTE — Progress Notes (Signed)
  Subjective:    Patient ID: Edward Newton, male    DOB: 1940/07/29, 72 y.o.   MRN: [id number]  HPI 72 y.o. male with history of hypertension, hypercholesterolemia, BPH.  # Shoulder pain - Both shoulders, left worse than right - Right shoulder is s/p rotator cuff repair - This has been occurring for a long time, worse at night - Takes Aleve 2 to 3 times a week, takes away most of the pain   Review of Systems  ROS negative except as above    Objective:   Physical Exam Filed Vitals:   12/26/12 1505  BP: 133/83  Pulse: 88  Temp: 98.1 F (36.7 C)   General: NAD CV: RRR, normal s1/s2, no M/R/G Resp: CTAB, good effort Extremities:  Strength: 5/5 shoulder abduction, adduction, internal and external rotation  ROM: grossly intact  Empty can test positive on left       Assessment & Plan:  See problem list documentation

## 2012-12-26 NOTE — Assessment & Plan Note (Signed)
Bilateral, worse in left - Likely some degree of impingement syndrome - Recommend RICE, try to switch to Tylenol since he has mildly elevated creatinine - He will schedule an appt if he wants to try physical therapy or joint injection

## 2012-12-26 NOTE — Patient Instructions (Addendum)
Take Aleve Sparingly, Tylenol may be beneficial Please schedule an appointment if you wish to see Physical therapy or try joint injection.   RICE: Routine Care for Injuries The routine care of many injuries includes Rest, Ice, Compression, and Elevation (RICE). HOME CARE INSTRUCTIONS  Rest is needed to allow your body to heal. Routine activities can usually be resumed when comfortable. Injured tendons and bones can take up to 6 weeks to heal. Tendons are the cord-like structures that attach muscle to bone.  Ice following an injury helps keep the swelling down and reduces pain.  Put ice in a plastic bag.  Place a towel between your skin and the bag.  Leave the ice on for 15-20 minutes, 3-4 times a day. Do this while awake, for the first 24 to 48 hours. After that, continue as directed by your caregiver.  Compression helps keep swelling down. It also gives support and helps with discomfort. If an elastic bandage has been applied, it should be removed and reapplied every 3 to 4 hours. It should not be applied tightly, but firmly enough to keep swelling down. Watch fingers or toes for swelling, bluish discoloration, coldness, numbness, or excessive pain. If any of these problems occur, remove the bandage and reapply loosely. Contact your caregiver if these problems continue.  Elevation helps reduce swelling and decreases pain. With extremities, such as the arms, hands, legs, and feet, the injured area should be placed near or above the level of the heart, if possible. SEEK IMMEDIATE MEDICAL CARE IF:  You have persistent pain and swelling.  You develop redness, numbness, or unexpected weakness.  Your symptoms are getting worse rather than improving after several days. These symptoms may indicate that further evaluation or further X-rays are needed. Sometimes, X-rays may not show a small broken bone (fracture) until 1 week or 10 days later. Make a follow-up appointment with your caregiver. Ask  when your X-ray results will be ready. Make sure you get your X-ray results. Document Released: 09/13/2000 Document Revised: 08/24/2011 Document Reviewed: 10/31/2010 [facility] Patient Information 2014 [place], [place].  Impingement Syndrome, Rotator Cuff, Bursitis with Rehab Impingement syndrome is a condition that involves inflammation of the tendons of the rotator cuff and the subacromial bursa, that causes pain in the shoulder. The rotator cuff consists of four tendons and muscles that control much of the shoulder and upper arm function. The subacromial bursa is a fluid filled sac that helps reduce friction between the rotator cuff and one of the bones of the shoulder (acromion). Impingement syndrome is usually an overuse injury that causes swelling of the bursa (bursitis), swelling of the tendon (tendonitis), and/or a tear of the tendon (strain). Strains are classified into three categories. Grade 1 strains cause pain, but the tendon is not lengthened. Grade 2 strains include a lengthened ligament, due to the ligament being stretched or partially ruptured. With grade 2 strains there is still function, although the function may be decreased. Grade 3 strains include a complete tear of the tendon or muscle, and function is usually impaired. SYMPTOMS   Pain around the shoulder, often at the outer portion of the upper arm.  Pain that gets worse with shoulder function, especially when reaching overhead or lifting.  Sometimes, aching when not using the arm.  Pain that wakes you up at night.  Sometimes, tenderness, swelling, warmth, or redness over the affected area.  Loss of strength.  Limited motion of the shoulder, especially reaching behind the back (to the back pocket or  to unhook bra) or across your body.  Crackling sound (crepitation) when moving the arm.  Biceps tendon pain and inflammation (in the front of the shoulder). Worse when bending the elbow or lifting. CAUSES  Impingement  syndrome is often an overuse injury, in which chronic (repetitive) motions cause the tendons or bursa to become inflamed. A strain occurs when a force is paced on the tendon or muscle that is greater than it can withstand. Common mechanisms of injury include: Stress from sudden increase in duration, frequency, or intensity of training.  Direct hit (trauma) to the shoulder.  Aging, erosion of the tendon with normal use.  Bony bump on shoulder (acromial spur). RISK INCREASES WITH:  Contact sports (football, wrestling, boxing).  Throwing sports (baseball, tennis, volleyball).  Weightlifting and bodybuilding.  Heavy labor.  Previous injury to the rotator cuff, including impingement.  Poor shoulder strength and flexibility.  Failure to warm up properly before activity.  Inadequate protective equipment.  Old age.  Bony bump on shoulder (acromial spur). PREVENTION   Warm up and stretch properly before activity.  Allow for adequate recovery between workouts.  Maintain physical fitness:  Strength, flexibility, and endurance.  Cardiovascular fitness.  Learn and use proper exercise technique. PROGNOSIS  If treated properly, impingement syndrome usually goes away within 6 weeks. Sometimes surgery is required.  RELATED COMPLICATIONS   Longer healing time if not properly treated, or if not given enough time to heal.  Recurring symptoms, that result in a chronic condition.  Shoulder stiffness, frozen shoulder, or loss of motion.  Rotator cuff tendon tear.  Recurring symptoms, especially if activity is resumed too soon, with overuse, with a direct blow, or when using poor technique. TREATMENT  Treatment first involves the use of ice and medicine, to reduce pain and inflammation. The use of strengthening and stretching exercises may help reduce pain with activity. These exercises may be performed at home or with a therapist. If non-surgical treatment is unsuccessful after more  than 6 months, surgery may be advised. After surgery and rehabilitation, activity is usually possible in 3 months.  MEDICATION  If pain medicine is needed, nonsteroidal anti-inflammatory medicines (aspirin and ibuprofen), or other minor pain relievers (acetaminophen), are often advised.  Do not take pain medicine for 7 days before surgery.  Prescription pain relievers may be given, if your caregiver thinks they are needed. Use only as directed and only as much as you need.  Corticosteroid injections may be given by your caregiver. These injections should be reserved for the most serious cases, because they may only be given a certain number of times. HEAT AND COLD  Cold treatment (icing) should be applied for 10 to 15 minutes every 2 to 3 hours for inflammation and pain, and immediately after activity that aggravates your symptoms. Use ice packs or an ice massage.  Heat treatment may be used before performing stretching and strengthening activities prescribed by your caregiver, physical therapist, or athletic trainer. Use a heat pack or a warm water soak. SEEK MEDICAL CARE IF:   Symptoms get worse or do not improve in 4 to 6 weeks, despite treatment.  New, unexplained symptoms develop. (Drugs used in treatment may produce side effects.) EXERCISES  RANGE OF MOTION (ROM) AND STRETCHING EXERCISES - Impingement Syndrome (Rotator Cuff  Tendinitis, Bursitis) These exercises may help you when beginning to rehabilitate your injury. Your symptoms may go away with or without further involvement from your physician, physical therapist or athletic trainer. While completing these exercises, remember:  Restoring tissue flexibility helps normal motion to return to the joints. This allows healthier, less painful movement and activity.  An effective stretch should be held for at least 30 seconds.  A stretch should never be painful. You should only feel a gentle lengthening or release in the stretched  tissue. STRETCH  Flexion, Standing  Stand with good posture. With an underhand grip on your right / left hand, and an overhand grip on the opposite hand, grasp a broomstick or cane so that your hands are a little more than shoulder width apart.  Keeping your right / left elbow straight and shoulder muscles relaxed, push the stick with your opposite hand, to raise your right / left arm in front of your body and then overhead. Raise your arm until you feel a stretch in your right / left shoulder, but before you have increased shoulder pain.  Try to avoid shrugging your right / left shoulder as your arm rises, by keeping your shoulder blade tucked down and toward your mid-back spine. Hold for __________ seconds.  Slowly return to the starting position. Repeat __________ times. Complete this exercise __________ times per day. STRETCH  Abduction, Supine  Lie on your back. With an underhand grip on your right / left hand and an overhand grip on the opposite hand, grasp a broomstick or cane so that your hands are a little more than shoulder width apart.  Keeping your right / left elbow straight and your shoulder muscles relaxed, push the stick with your opposite hand, to raise your right / left arm out to the side of your body and then overhead. Raise your arm until you feel a stretch in your right / left shoulder, but before you have increased shoulder pain.  Try to avoid shrugging your right / left shoulder as your arm rises, by keeping your shoulder blade tucked down and toward your mid-back spine. Hold for __________ seconds.  Slowly return to the starting position. Repeat __________ times. Complete this exercise __________ times per day. ROM  Flexion, Active-Assisted  Lie on your back. You may bend your knees for comfort.  Grasp a broomstick or cane so your hands are about shoulder width apart. Your right / left hand should grip the end of the stick, so that your hand is positioned  "thumbs-up," as if you were about to shake hands.  Using your healthy arm to lead, raise your right / left arm overhead, until you feel a gentle stretch in your shoulder. Hold for __________ seconds.  Use the stick to assist in returning your right / left arm to its starting position. Repeat __________ times. Complete this exercise __________ times per day.  ROM - Internal Rotation, Supine   Lie on your back on a firm surface. Place your right / left elbow about 60 degrees away from your side. Elevate your elbow with a folded towel, so that the elbow and shoulder are the same height.  Using a broomstick or cane and your strong arm, pull your right / left hand toward your body until you feel a gentle stretch, but no increase in your shoulder pain. Keep your shoulder and elbow in place throughout the exercise.  Hold for __________ seconds. Slowly return to the starting position. Repeat __________ times. Complete this exercise __________ times per day. STRETCH - Internal Rotation  Place your right / left hand behind your back, palm up.  Throw a towel or belt over your opposite shoulder. Grasp the towel with your right /  left hand.  While keeping an upright posture, gently pull up on the towel, until you feel a stretch in the front of your right / left shoulder.  Avoid shrugging your right / left shoulder as your arm rises, by keeping your shoulder blade tucked down and toward your mid-back spine.  Hold for __________ seconds. Release the stretch, by lowering your healthy hand. Repeat __________ times. Complete this exercise __________ times per day. ROM - Internal Rotation   Using an underhand grip, grasp a stick behind your back with both hands.  While standing upright with good posture, slide the stick up your back until you feel a mild stretch in the front of your shoulder.  Hold for __________ seconds. Slowly return to your starting position. Repeat __________ times. Complete this  exercise __________ times per day.  STRETCH  Posterior Shoulder Capsule   Stand or sit with good posture. Grasp your right / left elbow and draw it across your chest, keeping it at the same height as your shoulder.  Pull your elbow, so your upper arm comes in closer to your chest. Pull until you feel a gentle stretch in the back of your shoulder.  Hold for __________ seconds. Repeat __________ times. Complete this exercise __________ times per day. STRENGTHENING EXERCISES - Impingement Syndrome (Rotator Cuff Tendinitis, Bursitis) These exercises may help you when beginning to rehabilitate your injury. They may resolve your symptoms with or without further involvement from your physician, physical therapist or athletic trainer. While completing these exercises, remember:  Muscles can gain both the endurance and the strength needed for everyday activities through controlled exercises.  Complete these exercises as instructed by your physician, physical therapist or athletic trainer. Increase the resistance and repetitions only as guided.  You may experience muscle soreness or fatigue, but the pain or discomfort you are trying to eliminate should never worsen during these exercises. If this pain does get worse, stop and make sure you are following the directions exactly. If the pain is still present after adjustments, discontinue the exercise until you can discuss the trouble with your clinician.  During your recovery, avoid activity or exercises which involve actions that place your injured hand or elbow above your head or behind your back or head. These positions stress the tissues which you are trying to heal. STRENGTH - Scapular Depression and Adduction   With good posture, sit on a firm chair. Support your arms in front of you, with pillows, arm rests, or on a table top. Have your elbows in line with the sides of your body.  Gently draw your shoulder blades down and toward your mid-back  spine. Gradually increase the tension, without tensing the muscles along the top of your shoulders and the back of your neck.  Hold for __________ seconds. Slowly release the tension and relax your muscles completely before starting the next repetition.  After you have practiced this exercise, remove the arm support and complete the exercise in standing as well as sitting position. Repeat __________ times. Complete this exercise __________ times per day.  STRENGTH - Shoulder Abductors, Isometric  With good posture, stand or sit about 4-6 inches from a wall, with your right / left side facing the wall.  Bend your right / left elbow. Gently press your right / left elbow into the wall. Increase the pressure gradually, until you are pressing as hard as you can, without shrugging your shoulder or increasing any shoulder discomfort.  Hold for __________ seconds.  Release the  tension slowly. Relax your shoulder muscles completely before you begin the next repetition. Repeat __________ times. Complete this exercise __________ times per day.  STRENGTH - External Rotators, Isometric  Keep your right / left elbow at your side and bend it 90 degrees.  Step into a door frame so that the outside of your right / left wrist can press against the door frame without your upper arm leaving your side.  Gently press your right / left wrist into the door frame, as if you were trying to swing the back of your hand away from your stomach. Gradually increase the tension, until you are pressing as hard as you can, without shrugging your shoulder or increasing any shoulder discomfort.  Hold for __________ seconds.  Release the tension slowly. Relax your shoulder muscles completely before you begin the next repetition. Repeat __________ times. Complete this exercise __________ times per day.  STRENGTH - Supraspinatus   Stand or sit with good posture. Grasp a __________ weight, or an exercise band or tubing, so  that your hand is "thumbs-up," like you are shaking hands.  Slowly lift your right / left arm in a "V" away from your thigh, diagonally into the space between your side and straight ahead. Lift your hand to shoulder height or as far as you can, without increasing any shoulder pain. At first, many people do not lift their hands above shoulder height.  Avoid shrugging your right / left shoulder as your arm rises, by keeping your shoulder blade tucked down and toward your mid-back spine.  Hold for __________ seconds. Control the descent of your hand, as you slowly return to your starting position. Repeat __________ times. Complete this exercise __________ times per day.  STRENGTH - External Rotators  Secure a rubber exercise band or tubing to a fixed object (table, pole) so that it is at the same height as your right / left elbow when you are standing or sitting on a firm surface.  Stand or sit so that the secured exercise band is at your uninjured side.  Bend your right / left elbow 90 degrees. Place a folded towel or small pillow under your right / left arm, so that your elbow is a few inches away from your side.  Keeping the tension on the exercise band, pull it away from your body, as if pivoting on your elbow. Be sure to keep your body steady, so that the movement is coming only from your rotating shoulder.  Hold for __________ seconds. Release the tension in a controlled manner, as you return to the starting position. Repeat __________ times. Complete this exercise __________ times per day.  STRENGTH - Internal Rotators   Secure a rubber exercise band or tubing to a fixed object (table, pole) so that it is at the same height as your right / left elbow when you are standing or sitting on a firm surface.  Stand or sit so that the secured exercise band is at your right / left side.  Bend your elbow 90 degrees. Place a folded towel or small pillow under your right / left arm so that your  elbow is a few inches away from your side.  Keeping the tension on the exercise band, pull it across your body, toward your stomach. Be sure to keep your body steady, so that the movement is coming only from your rotating shoulder.  Hold for __________ seconds. Release the tension in a controlled manner, as you return to the starting position.  Repeat __________ times. Complete this exercise __________ times per day.  STRENGTH - Scapular Protractors, Standing   Stand arms length away from a wall. Place your hands on the wall, keeping your elbows straight.  Begin by dropping your shoulder blades down and toward your mid-back spine.  To strengthen your protractors, keep your shoulder blades down, but slide them forward on your rib cage. It will feel as if you are lifting the back of your rib cage away from the wall. This is a subtle motion and can be challenging to complete. Ask your caregiver for further instruction, if you are not sure you are doing the exercise correctly.  Hold for __________ seconds. Slowly return to the starting position, resting the muscles completely before starting the next repetition. Repeat __________ times. Complete this exercise __________ times per day. STRENGTH - Scapular Protractors, Supine  Lie on your back on a firm surface. Extend your right / left arm straight into the air while holding a __________ weight in your hand.  Keeping your head and back in place, lift your shoulder off the floor.  Hold for __________ seconds. Slowly return to the starting position, and allow your muscles to relax completely before starting the next repetition. Repeat __________ times. Complete this exercise __________ times per day. STRENGTH - Scapular Protractors, Quadruped  Get onto your hands and knees, with your shoulders directly over your hands (or as close as you can be, comfortably).  Keeping your elbows locked, lift the back of your rib cage up into your shoulder  blades, so your mid-back rounds out. Keep your neck muscles relaxed.  Hold this position for __________ seconds. Slowly return to the starting position and allow your muscles to relax completely before starting the next repetition. Repeat __________ times. Complete this exercise __________ times per day.  STRENGTH - Scapular Retractors  Secure a rubber exercise band or tubing to a fixed object (table, pole), so that it is at the height of your shoulders when you are either standing, or sitting on a firm armless chair.  With a palm down grip, grasp an end of the band in each hand. Straighten your elbows and lift your hands straight in front of you, at shoulder height. Step back, away from the secured end of the band, until it becomes tense.  Squeezing your shoulder blades together, draw your elbows back toward your sides, as you bend them. Keep your upper arms lifted away from your body throughout the exercise.  Hold for __________ seconds. Slowly ease the tension on the band, as you reverse the directions and return to the starting position. Repeat __________ times. Complete this exercise __________ times per day. STRENGTH - Shoulder Extensors   Secure a rubber exercise band or tubing to a fixed object (table, pole) so that it is at the height of your shoulders when you are either standing, or sitting on a firm armless chair.  With a thumbs-up grip, grasp an end of the band in each hand. Straighten your elbows and lift your hands straight in front of you, at shoulder height. Step back, away from the secured end of the band, until it becomes tense.  Squeezing your shoulder blades together, pull your hands down to the sides of your thighs. Do not allow your hands to go behind you.  Hold for __________ seconds. Slowly ease the tension on the band, as you reverse the directions and return to the starting position. Repeat __________ times. Complete this exercise __________ times per day.  STRENGTH  - Scapular Retractors and External Rotators   Secure a rubber exercise band or tubing to a fixed object (table, pole) so that it is at the height as your shoulders, when you are either standing, or sitting on a firm armless chair.  With a palm down grip, grasp an end of the band in each hand. Bend your elbows 90 degrees and lift your elbows to shoulder height, at your sides. Step back, away from the secured end of the band, until it becomes tense.  Squeezing your shoulder blades together, rotate your shoulders so that your upper arms and elbows remain stationary, but your fists travel upward to head height.  Hold for __________ seconds. Slowly ease the tension on the band, as you reverse the directions and return to the starting position. Repeat __________ times. Complete this exercise __________ times per day.  STRENGTH - Scapular Retractors and External Rotators, Rowing   Secure a rubber exercise band or tubing to a fixed object (table, pole) so that it is at the height of your shoulders, when you are either standing, or sitting on a firm armless chair.  With a palm down grip, grasp an end of the band in each hand. Straighten your elbows and lift your hands straight in front of you, at shoulder height. Step back, away from the secured end of the band, until it becomes tense.  Step 1: Squeeze your shoulder blades together. Bending your elbows, draw your hands to your chest, as if you are rowing a boat. At the end of this motion, your hands and elbow should be at shoulder height and your elbows should be out to your sides.  Step 2: Rotate your shoulders, to raise your hands above your head. Your forearms should be vertical and your upper arms should be horizontal.  Hold for __________ seconds. Slowly ease the tension on the band, as you reverse the directions and return to the starting position. Repeat __________ times. Complete this exercise __________ times per day.  STRENGTH  Scapular  Depressors  Find a sturdy chair without wheels, such as a dining room chair.  Keeping your feet on the floor, and your hands on the chair arms, lift your bottom up from the seat, and lock your elbows.  Keeping your elbows straight, allow gravity to pull your body weight down. Your shoulders will rise toward your ears.  Raise your body against gravity by drawing your shoulder blades down your back, shortening the distance between your shoulders and ears. Although your feet should always maintain contact with the floor, your feet should progressively support less body weight, as you get stronger.  Hold for __________ seconds. In a controlled and slow manner, lower your body weight to begin the next repetition. Repeat __________ times. Complete this exercise __________ times per day.  Document Released: 06/01/2005 Document Revised: 08/24/2011 Document Reviewed: 09/13/2008 [facility] [facility] Patient Information 2014 [place], [place].

## 2013-02-15 ENCOUNTER — Encounter (HOSPITAL_COMMUNITY): Payer: Self-pay | Admitting: Dentistry

## 2013-02-15 ENCOUNTER — Ambulatory Visit (HOSPITAL_COMMUNITY): Payer: Self-pay | Admitting: Dentistry

## 2013-02-15 VITALS — BP 121/74 | HR 83 | Temp 98.3°F

## 2013-02-15 DIAGNOSIS — K0530 Chronic periodontitis, unspecified: Secondary | ICD-10-CM

## 2013-02-15 DIAGNOSIS — K036 Deposits [accretions] on teeth: Secondary | ICD-10-CM

## 2013-02-15 DIAGNOSIS — Z923 Personal history of irradiation: Secondary | ICD-10-CM

## 2013-02-15 DIAGNOSIS — K08109 Complete loss of teeth, unspecified cause, unspecified class: Secondary | ICD-10-CM

## 2013-02-15 DIAGNOSIS — K089 Disorder of teeth and supporting structures, unspecified: Secondary | ICD-10-CM

## 2013-02-15 DIAGNOSIS — M270 Developmental disorders of jaws: Secondary | ICD-10-CM

## 2013-02-15 DIAGNOSIS — K0889 Other specified disorders of teeth and supporting structures: Secondary | ICD-10-CM

## 2013-02-15 DIAGNOSIS — M264 Malocclusion, unspecified: Secondary | ICD-10-CM

## 2013-02-15 NOTE — Patient Instructions (Signed)
Instructions for Denture Use and Care  Congratulations, you are on the way to oral rehabilitation!  You have just received a new set of complete or partial dentures.  These prostheses will help to improve both your appearance and chewing ability.  These instructions will help you get adjusted to your dentures as well as care for them properly.  Please read these instructions carefully and completely as soon as you get home.  If you or your caregiver have any questions please notify the [facility] at [phone].  HOW YOUR DENTURES LOOK AND FEEL Soon after you begin wearing your dentures, you may feel that your dentures are too large or even loose.  As our mouth and facial muscles become accustomed to the dentures, these feelings will go away.  You also may feel that you are salivating more than you normally do.  This feeling should go away as you get used to having the dentures in your mouth.  You may bite your cheek or your tongue; this will eventually resolve itself as you wear your dentures.  Some soreness is to be expected, but you should not hurt.  If your mouth hurts, call your dentist.  A denture adhesive may occasionally be necessary to hold your dentures in place more securely.  The dentist will let you know when one is recommended for you.  SPEAKING Wearing dentures will change the sound of your voice initially.  This will be noticed by you more than anyone else.  Bite and swallow before you speak, in order to place your dentures in position so that you may speak more clearly.  Practice speaking by reading aloud or counting from 1 to 100 very slowly and distinctly.  After some practice your mouth will become accustomed to your dentures and you will speak more clearly.  EATING Chewing will definitely be different after you receive your dentures.  With a little practice and patience you should be able to eat just about any kind of food.  Begin by eating small quantities of food  that are cut into small pieces.  Star with soft foods such as eggs, cooked vegetables, or puddings.  As you gain confidence advance  Your diet to whatever texture foods you can tolerate.  DENTURE CARE Dentures can collect plaque and calculus much the same as natural teeth can.  If not removed on a regular basis, your dentures will not look or feel clean, and you will experience denture odor.  It is very important that you remove your dentures at bedtime and clean them thoroughly.  You should: 1. Clean your dentures over a sink full of water so if dropped, breakage will be prevented. 2. Rinse your dentures with cool water to remove any large food particles. 3. Use soap and water or a denture cleanser or paste to clean the dentures.  Do not use regular toothpaste as it may abrade the denture base or teeth. 4. Use a moistened denture brush to clean all surfaces (inside and outside). 5. Rinse thoroughly to remove any remaining soap or denture cleanser. 6. Use a soft bristle toothbrush to gently brush any natural teeth, gums, tongue, and palate at bedtime and before reinserting your dentures. 7. Do not sleep with your dentures in your mouth at night.  Remove your dentures and soak them overnight in a denture cup filled with water or denture solution as recommended by your dentist.  This routine will become second nature and will increase the life and comfort   of your dentures.  Please do not try to adjust these dentures yourself; you could damage them.  FOLLOW-UP You should call or make an appointment with your dentist.  Your dentist would like to see you at least once a year for a check-up and examination. 

## 2013-02-15 NOTE — Progress Notes (Signed)
02/15/2013  Patient:            Edward Newton Date of Birth:  May 07, 1941 MRN:                [id number]  BP 121/74  Pulse 83  Temp(Src) 98.3 F (36.8 C) (Oral)    [name] presents for periodic oral exam and adult prophylaxis.  Past Medical History  Diagnosis Date  . Hyperlipidemia   . BPH (benign prostatic hyperplasia)   . 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 Gerilyn Pilgrim; S/P Radiation therapy 10/26/02 thru 12/11/02 6600cGy  . Hypertension 10/19/2008  Patient denies any recent health history changes. Patient sees Designer, fashion/clothing residents with [facility] on the [facility]. .. Allergies  Allergen Reactions  . Lisinopril     Lip swelling    Current Outpatient Prescriptions  Medication Sig Dispense Refill  . hydrochlorothiazide (HYDRODIURIL) 25 MG tablet Take 1 tablet (25 mg total) by mouth daily.  90 tablet  3  . JALYN 0.5-0.4 MG CAPS       . tadalafil (CIALIS) 20 MG tablet Take 0.5-1 tablets (10-20 mg total) by mouth every other day as needed for erectile dysfunction.  5 tablet  11   No current facility-administered medications for this visit.    C/C: "I have some denture irritation to upper gums."  HPI: Patient indicates that he has had some soreness to the maxillary anterior area. Patient indicates that he has not been wearing his lower partial denture and this seems to cause more soreness to the "upper gums".  Patient denies having any acute dental pain or toothache symptoms. Patient was last seen for multiple dental mandibular anterior restorations. Patient was to followup for lower partial denture, but did not do so. Patient indicates that he is now retired.  DENTAL EXAM  General: Patient is a well-developed, well-nourished male in no acute distress. Vitals: BP 121/74  Pulse 83  Temp(Src) 98.3 F (36.8 C) (Oral)  Extraoral Exam: Left neck is consistent with previous neck dissection and parotidectomy.  There is no right neck lymphadenopathy. The patient denies any acute TMJ symptoms. Decreased maximum interincisal opening as before. Intraoral Exam: Xerostomia. There is no significant denture irritation noted today.Patient has bilateral mandibular lingual tori. Dentition: Patient has tooth numbers 22 through 29 remaining. Caries: The patient has no dental caries. The patient does have significant abfraction lesions involving the facial and lingual aspects of tooth numbers 22 through 27. Periodontal: Chronic periodontal disease with accretions. Adult prophylaxis performed with KaVo Sonic scaler and hand curettes. Oral Hygiene instructions provided. Endodontic: Patient denies acute pulpitis symptoms .  C&B: There are no crown restorations. Prosthodontic: Patient has an upper complete denture that is acceptable. The lower partial denture is less than ideal due to to the addition of a wrought wire clasp to the area of #22 after losing abutment tooth #21. Pressure indicating paste was applied and the dentures were adjusted as indicated.  Dentures were polished. Patient accepted results. Occlusion: The patient has a less than ideal occlusion at this time. The patient accepts the results of the denture occlusal adjustment today. Patient will think about proceeding with future fabrication of a new lower partial denture. The presence of the mandibular lingual tori does complicate the future re-fabrication of the lower partial denture. Referral to a prosthodontist was again discussed.  Assessments: 1. Chronic periodontitis 2. Accretions 3. Multiple abfraction lesions involving the mandibular anterior teeth. 4.  Xerostomia 5. Bilateral mandibular lingual tori 6. Decreased maximum interincisal opening 7. Poor occlusal scheme 8. History of previous radiation therapy with risk for osteonecrosis to the radiation therapy areas.  Plan/Recommendations: 1. I discussed the risks, benefits, and complications of  various treatment options with the patient in relationship to his medical and dental conditions and previous radiation therapy. The patient currently wishes to defer all further dental treatment at this time to include new lower partial denture fabrication referral to a prosthodontist for evaluation of treatment options.  Patient accepted the results of the denture adjustment today. Patient agrees to return to clinic for an exam and cleaning in approximately 6 months. Patient is to call as needed if problems arise.  Charlynne Pander, DDS

## 2013-08-14 ENCOUNTER — Encounter (HOSPITAL_COMMUNITY): Payer: Self-pay | Admitting: Dentistry

## 2013-08-14 ENCOUNTER — Ambulatory Visit (HOSPITAL_COMMUNITY): Payer: Medicaid - Dental | Admitting: Dentistry

## 2013-08-14 ENCOUNTER — Encounter (INDEPENDENT_AMBULATORY_CARE_PROVIDER_SITE_OTHER): Payer: Self-pay

## 2013-08-14 VITALS — BP 127/77 | HR 88 | Temp 98.0°F

## 2013-08-14 DIAGNOSIS — K031 Abrasion of teeth: Secondary | ICD-10-CM

## 2013-08-14 DIAGNOSIS — K0530 Chronic periodontitis, unspecified: Secondary | ICD-10-CM

## 2013-08-14 DIAGNOSIS — K036 Deposits [accretions] on teeth: Secondary | ICD-10-CM

## 2013-08-14 DIAGNOSIS — R682 Dry mouth, unspecified: Secondary | ICD-10-CM

## 2013-08-14 DIAGNOSIS — Z923 Personal history of irradiation: Secondary | ICD-10-CM

## 2013-08-14 DIAGNOSIS — M270 Developmental disorders of jaws: Secondary | ICD-10-CM

## 2013-08-14 DIAGNOSIS — K117 Disturbances of salivary secretion: Secondary | ICD-10-CM

## 2013-08-14 NOTE — Patient Instructions (Signed)
Brush teeth after meals and at bedtime. Return to clinic in 6 months for an exam and cleaning. Call if problems arise before then. Dr. Enrique Sack

## 2013-08-14 NOTE — Progress Notes (Signed)
08/14/2013  Patient:            Edward Newton Date of Birth:  17-Oct-1940 MRN:                [id number]  BP 127/77  Pulse 88  Temp(Src) 98 F (36.7 C) (Oral)    [name] presents for periodic oral exam and adult prophylaxis.  Past Medical History  Diagnosis Date  . Hyperlipidemia   . BPH (benign prostatic hyperplasia)   . 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 Edison Nasuti; S/P Radiation therapy 10/26/02 thru 12/11/02 6600cGy  . Hypertension 10/19/2008  . Diabetes mellitus without complication Onset -0/93/26  Patient with recent Diabetes diagnosis and start of Metformin 500 mg twice a day. Patient sees Orthoptist residents with [facility] on the [facility]. .. Allergies  Allergen Reactions  . Lisinopril     Lip swelling    Current Outpatient Prescriptions  Medication Sig Dispense Refill  . hydrochlorothiazide (HYDRODIURIL) 25 MG tablet Take 1 tablet (25 mg total) by mouth daily.  90 tablet  3  . JALYN 0.5-0.4 MG CAPS       . metFORMIN (GLUCOPHAGE) 500 MG tablet Take 500 mg by mouth 2 (two) times daily with a meal.      . tadalafil (CIALIS) 20 MG tablet Take 0.5-1 tablets (10-20 mg total) by mouth every other day as needed for erectile dysfunction.  5 tablet  11   No current facility-administered medications for this visit.    C/C: "It's time for a check up."  HPI: Patient indicates that he has not been wearing his lower partial denture but is doing fine without it.  Patient denies having any acute dental pain or toothache symptoms. Patient indicates that he is now 17 an doing well.  DENTAL EXAM  General: Patient is a well-developed, well-nourished male in no acute distress. Vitals: BP 127/77  Pulse 88  Temp(Src) 98 F (36.7 C) (Oral)  Extraoral Exam: Left neck is consistent with previous neck dissection and parotidectomy. There is no right neck lymphadenopathy. The patient denies any acute TMJ  symptoms. Decreased maximum interincisal opening as before. Intraoral Exam: Mild Xerostomia. There is no significant denture irritation noted today.Patient has bilateral mandibular lingual tori. The patient has loose, flabby tissue involving the premaxilla alveolar ridge. Dentition: Patient has tooth numbers 22 through 29 remaining. Caries: The patient has no dental caries. The patient does have significant abfraction lesions involving the facial and lingual aspects of tooth numbers 22 through 27. We did discuss future fracture of the mandible anterior teeth due to loss of tooth structure. Periodontal: Chronic periodontal disease with accretions. Plus mobility of 23,24,25. Minimal plaque and calculus. Adult prophylaxis performed with KaVo Sonic scaler and hand curettes. Oral Hygiene instructions provided. Clear- Fluoride varnish applied with instructions to avoid eating for one hour. Endodontic: Patient denies acute pulpitis symptoms .  C&B: There are no crown restorations. Prosthodontic: Patient has an upper complete denture that is acceptable. Pressure indicating paste was applied to upper denture and no adjustment was needed. Patient accepted results. Occlusion: The patient has a less than ideal occlusion at this time.  The patient bites into the maxillary anterior occlusion with some displacement of the upper denture secondary to the loose, flabby tissue of the premaxilla. Patient will think about proceeding with future fabrication of a new lower partial denture. The presence of the mandibular lingual tori does complicate the future re-fabrication  of the lower partial denture. Referral to a prosthodontist was again discussed.  Assessments: 1. Chronic periodontitis 2. Accretions 3. Multiple abfraction lesions involving the mandibular anterior teeth. 4. Xerostomia 5. Bilateral mandibular lingual tori 6. Decreased maximum interincisal opening 7. Poor occlusal scheme 8. History of previous  radiation therapy with risk for osteonecrosis to the radiation therapy areas.  Plan/Recommendations: 1. I discussed the risks, benefits, and complications of various treatment options with the patient in relationship to his medical and dental conditions and previous radiation therapy. The patient currently wishes to defer all further dental treatment at this time to include new lower partial denture fabrication and referral to a prosthodontist for evaluation of treatment options.  Patient agrees to return to clinic for an exam and cleaning in approximately 6 months. Patient is to call as needed if problems arise.  Lenn Cal, DDS

## 2013-09-25 ENCOUNTER — Other Ambulatory Visit: Payer: Self-pay | Admitting: Family Medicine

## 2014-02-12 ENCOUNTER — Encounter (HOSPITAL_COMMUNITY): Payer: Self-pay | Admitting: Dentistry

## 2014-02-20 ENCOUNTER — Encounter (HOSPITAL_COMMUNITY): Payer: Self-pay | Admitting: Dentistry

## 2014-02-22 ENCOUNTER — Encounter (INDEPENDENT_AMBULATORY_CARE_PROVIDER_SITE_OTHER): Payer: Self-pay

## 2014-02-22 ENCOUNTER — Ambulatory Visit (HOSPITAL_COMMUNITY): Payer: Medicaid - Dental | Admitting: Dentistry

## 2014-02-22 VITALS — BP 126/64 | HR 81 | Temp 98.4°F

## 2014-02-22 DIAGNOSIS — K036 Deposits [accretions] on teeth: Secondary | ICD-10-CM

## 2014-02-22 DIAGNOSIS — R682 Dry mouth, unspecified: Secondary | ICD-10-CM

## 2014-02-22 DIAGNOSIS — Z923 Personal history of irradiation: Secondary | ICD-10-CM

## 2014-02-22 DIAGNOSIS — K089 Disorder of teeth and supporting structures, unspecified: Secondary | ICD-10-CM

## 2014-02-22 DIAGNOSIS — K0530 Chronic periodontitis, unspecified: Secondary | ICD-10-CM

## 2014-02-22 DIAGNOSIS — M270 Developmental disorders of jaws: Secondary | ICD-10-CM

## 2014-02-22 DIAGNOSIS — M264 Malocclusion, unspecified: Secondary | ICD-10-CM

## 2014-02-22 DIAGNOSIS — K117 Disturbances of salivary secretion: Secondary | ICD-10-CM

## 2014-02-22 NOTE — Patient Instructions (Signed)
Plan/Recommendations: 1. I discussed the risks, benefits, and complications of various treatment options with the patient in relationship to his medical and dental conditions and previous radiation therapy. The patient currently wishes to defer all further dental treatment at this time to include new lower partial denture fabrication and referral to a prosthodontist for evaluation of treatment options.  Patient agrees to return to clinic for an exam and cleaning in approximately 6 months. Patient is to call as needed if problems arise. Patient to continue to brush teeth after meals and at bedtime. Patient to call if problems arise before next scheduled appointment. Lenn Cal, DDS

## 2014-02-22 NOTE — Progress Notes (Signed)
02/22/2014  Patient:            Edward Newton Date of Birth:  April 05, 1941 MRN:                [id number]  BP 126/64  Pulse 81  Temp(Src) 98.4 F (36.9 C) (Oral)    Edward Newton presents for periodic oral exam and adult prophylaxis.  Past Medical History  Diagnosis Date  . Hyperlipidemia   . BPH (benign prostatic hyperplasia)   . 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 Edison Nasuti; S/P Radiation therapy 10/26/02 thru 12/11/02 6600cGy  . Hypertension 10/19/2008  . Diabetes mellitus without complication Onset -2/42/35   .Marland Kitchen Allergies  Allergen Reactions  . Lisinopril     Lip swelling    Current Outpatient Prescriptions  Medication Sig Dispense Refill  . atorvastatin (LIPITOR) 20 MG tablet Take 20 mg by mouth at bedtime.      . hydrochlorothiazide (HYDRODIURIL) 25 MG tablet TAKE ONE TABLET BY MOUTH ONCE DAILY  90 tablet  0  . JALYN 0.5-0.4 MG CAPS       . metFORMIN (GLUCOPHAGE) 500 MG tablet Take 500 mg by mouth 2 (two) times daily with a meal.      . tadalafil (CIALIS) 20 MG tablet Take 0.5-1 tablets (10-20 mg total) by mouth every other day as needed for erectile dysfunction.  5 tablet  11   No current facility-administered medications for this visit.   C/C: "It's time for a check up."  HPI: Patient indicates that he has not been wearing his lower partial denture much but is doing fine without it.  Patient denies having any acute dental pain or toothache symptoms.   DENTAL EXAM  General: Patient is a well-developed, well-nourished male in no acute distress. Vitals: BP 126/64  Pulse 81  Temp(Src) 98.4 F (36.9 C) (Oral)  Extraoral Exam: Left neck is consistent with previous neck dissection and parotidectomy. There is no right neck lymphadenopathy. The patient denies any acute TMJ symptoms. Decreased maximum interincisal opening as before. Intraoral Exam: Mild Xerostomia. There is no significant denture irritation noted  today.Patient has bilateral mandibular lingual tori. The patient has loose, flabby tissue involving the premaxilla alveolar ridge. Dentition: Patient has tooth numbers 22 through 29 remaining. Caries: The patient has no dental caries. The patient does have significant abfraction lesions involving the facial and lingual aspects of tooth numbers 22 through 27. We did discuss potential for future fracture of the mandible anterior teeth due to loss of tooth structure. Periodontal: Chronic periodontal disease with accretions. Plus mobility of 23,24,25. Minimal plaque and calculus. Adult prophylaxis performed with CAVITRON and hand curettes. Oral Hygiene instructions provided. Clear- Fluoride varnish applied with instructions to avoid eating for one hour. Endodontic: Patient denies acute pulpitis symptoms .  C&B: There are no crown restorations. Prosthodontic: Patient has an upper complete denture that is acceptable. Pressure indicating paste was applied to upper denture and no adjustment was needed. Patient accepted results. Occlusion: The patient has a less than ideal occlusion at this time.  The patient bites into the maxillary anterior occlusion with some displacement of the upper denture secondary to the loose, flabby tissue of the premaxilla. The presence of the mandibular lingual tori does complicate the future re-fabrication of the lower partial denture. Referral to a prosthodontist was again discussed.  Assessments: 1. Chronic periodontitis 2. Accretions-minimal  3. Multiple abfraction lesions involving the mandibular anterior teeth. 4. Xerostomia 5. Bilateral  mandibular lingual tori 6. Decreased maximum interincisal opening 7. Poor occlusal scheme 8. History of previous radiation therapy with risk for osteoradionecrosis to the radiation therapy areas.  Plan/Recommendations: 1. I discussed the risks, benefits, and complications of various treatment options with the patient in relationship to  his medical and dental conditions and previous radiation therapy. The patient currently wishes to defer all further dental treatment at this time to include new lower partial denture fabrication and referral to a prosthodontist for evaluation of treatment options.  Patient agrees to return to clinic for an exam and cleaning in approximately 6 months. Patient is to call as needed if problems arise.  Lenn Cal, DDS

## 2014-06-26 ENCOUNTER — Encounter (HOSPITAL_COMMUNITY): Payer: Self-pay | Admitting: Dentistry

## 2014-06-26 ENCOUNTER — Ambulatory Visit (HOSPITAL_COMMUNITY): Payer: Medicaid - Dental | Admitting: Dentistry

## 2014-06-26 ENCOUNTER — Other Ambulatory Visit: Payer: Self-pay | Admitting: *Deleted

## 2014-06-26 VITALS — BP 121/71 | HR 79 | Temp 98.1°F

## 2014-06-26 DIAGNOSIS — K089 Disorder of teeth and supporting structures, unspecified: Secondary | ICD-10-CM

## 2014-06-26 DIAGNOSIS — K029 Dental caries, unspecified: Secondary | ICD-10-CM

## 2014-06-26 NOTE — Patient Instructions (Signed)
Patient was instructed to keep the partial denture out for 24 hours. Patient was instructed not to chew on the right side for 24 hours. Patient was cautioned about chewing hard foods on the right side due to potential for fracture of this restoration. Patient is aware that ideal restoration as a porcelain fused to metal crowns fabricated to fit the lower partial denture. Patient was instructed that I will refer to crown or bridge specialist for this procedure if so desired by the patient. Patient to call problems arise. Dr. Enrique Sack

## 2014-06-26 NOTE — Progress Notes (Signed)
06/26/2014  Patient:            DELDRICK Newton Date of Birth:  06-22-1940 MRN:                [id number]   BP 121/71 mmHg  Pulse 79  Temp(Src) 98.1 F (36.7 C) (Oral)  Edward Newton is a 74 year old male that presents for evaluation of a fractured filling on tooth #29. Patient indicates that he lost the restoration just before Christmas. Patient denies having any acute dental pain.  Exam: Periapical radiograph was taken. Dental caries noted on the distal aspect. No obvious periapical pathology is noted. Tooth #29 has a fractured distal occlusal lingual portion of the previous restoration. Dental caries are noted on the distal lingual aspect. I discussed the risks, benefits, and complications various treatment options for this tooth. We discussed no treatment, dental extraction, attempted restoration, and possible crown restoration made to fit the lower partial denture. She currently wishes to proceed with an amalgam restoration for this tooth and will consider a crown at a later date. Patient is aware that he will be referred to chronic bridge specialist for this crown if the patient so desires. Patient expresses understanding.  Procedure: 36 mg of Xylocaine with 0.018 mg of epinephrine via infiltration and inferior alveolar nerve block and long buccal nerve blocks. The caries were excavated. No carious exposure was noted. [id number] DOL/Amalgam [place], Tytin alloy. Burnish and occlusion and distal restoration were carved to fit the lower partial denture without problems. Patient accepts results.  Plan: 1. The patient is to brush after meals and at bedtime. 2. Patient is to avoid chewing on the right side for 24 hours. 3. Patient is to keep the lower partial denture out for 24 hours. 4. Patient is to avoid chewing hard foods on this tooth as it is prone to fracturing at this point without a crown restoration. 5. Patient is to contact dental medicine if he decides that he wishes to proceed  with crown restoration. I will refer the patient to dental practice that performs extensive crown and bridge therapy at that time.  Patient was dismissed in stable condition.  Dr. Enrique Sack

## 2014-08-21 ENCOUNTER — Ambulatory Visit (HOSPITAL_COMMUNITY): Payer: Self-pay | Admitting: Dentistry

## 2014-09-04 ENCOUNTER — Ambulatory Visit (HOSPITAL_COMMUNITY): Payer: Medicaid - Dental | Admitting: Dentistry

## 2014-09-04 ENCOUNTER — Encounter (HOSPITAL_COMMUNITY): Payer: Self-pay | Admitting: Dentistry

## 2014-09-04 VITALS — BP 144/80 | HR 76 | Temp 97.7°F

## 2014-09-04 DIAGNOSIS — Z923 Personal history of irradiation: Secondary | ICD-10-CM

## 2014-09-04 DIAGNOSIS — K031 Abrasion of teeth: Secondary | ICD-10-CM

## 2014-09-04 DIAGNOSIS — K117 Disturbances of salivary secretion: Secondary | ICD-10-CM

## 2014-09-04 DIAGNOSIS — K0530 Chronic periodontitis, unspecified: Secondary | ICD-10-CM

## 2014-09-04 DIAGNOSIS — M264 Malocclusion, unspecified: Secondary | ICD-10-CM

## 2014-09-04 DIAGNOSIS — M270 Developmental disorders of jaws: Secondary | ICD-10-CM

## 2014-09-04 DIAGNOSIS — K036 Deposits [accretions] on teeth: Secondary | ICD-10-CM

## 2014-09-04 DIAGNOSIS — R682 Dry mouth, unspecified: Secondary | ICD-10-CM

## 2014-09-04 NOTE — Progress Notes (Signed)
09/04/2014  Patient:            Edward Newton Date of Birth:  03-16-1941 MRN:                [id number]  BP 144/80 mmHg  Pulse 76  Temp(Src) 97.7 F (36.5 C) (Oral)    [name] presents for periodic oral exam and adult prophylaxis. Patient denies having any problems with previous restoration of tooth #29.  Past Medical History  Diagnosis Date  . Hyperlipidemia   . BPH (benign prostatic hyperplasia)   . 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 Edison Nasuti; S/P Radiation therapy 10/26/02 thru 12/11/02 6600cGy  . Hypertension 10/19/2008  . Diabetes mellitus without complication Onset -9/39/03   .Marland Kitchen Allergies  Allergen Reactions  . Lisinopril     Lip swelling    Current Outpatient Prescriptions  Medication Sig Dispense Refill  . atorvastatin (LIPITOR) 20 MG tablet Take 20 mg by mouth at bedtime.    . hydrochlorothiazide (HYDRODIURIL) 25 MG tablet TAKE ONE TABLET BY MOUTH ONCE DAILY 90 tablet 0  . JALYN 0.5-0.4 MG CAPS     . metFORMIN (GLUCOPHAGE) 500 MG tablet Take 500 mg by mouth 2 (two) times daily with a meal.    . tadalafil (CIALIS) 20 MG tablet Take 0.5-1 tablets (10-20 mg total) by mouth every other day as needed for erectile dysfunction. 5 tablet 11   No current facility-administered medications for this visit.   C/C: "It's time for a check up."  HPI: Patient indicates that he has not had any problems with previous restoration of tooth #29 performed in January 2016. Patient denies having any acute dental pain or toothache symptoms.   DENTAL EXAM  General: Patient is a well-developed, well-nourished male in no acute distress. Vitals: BP 144/80 mmHg  Pulse 76  Temp(Src) 97.7 F (36.5 C) (Oral)  Extraoral Exam: Left neck is consistent with previous neck dissection and parotidectomy. There is no right neck lymphadenopathy. The patient denies any acute TMJ symptoms. Decreased maximum interincisal opening as  before. Intraoral Exam: Mild Xerostomia. There is no significant denture irritation noted today. Patient has bilateral mandibular lingual tori. The patient has loose, flabby tissue involving the premaxilla alveolar ridge. Dentition: Patient has tooth numbers 22 through 29 remaining. Caries: The patient has no dental caries. The patient does have significant abfraction lesions involving the facial and lingual aspects of tooth numbers 22 through 27. We again discussed potential for future fracture of the mandible anterior teeth due to loss of tooth structure. We also discussed possible pulpal involvement due to the flexure lesions. We'll again consider restoration of the flexure lesions 6 month recall. Periodontal: Chronic periodontal disease with accretions. Plus mobility of 23,24,25. Minimal plaque and calculus. Adult prophylaxis performed with CAVITRON and hand curettes. Oral Hygiene instructions provided. Clear- Fluoride varnish applied with instructions to avoid eating for one hour. Endodontic: Patient denies acute pulpitis symptoms.  C&B: There are no crown restorations. We again discuss crown on tooth #29 to fit the existing partial denture. Patient refuses referral at this time. Prosthodontic: Patient has an upper complete denture that is acceptable and lower partial that is less than ideal. Pressure indicating paste was applied to upper denture and lower partial with adjustments made as needed. [place] Patient accepted results. Occlusion: The patient has a less than ideal occlusion at this time.  The patient bites into the maxillary anterior occlusion with some displacement of the upper  denture secondary to the loose, flabby tissue of the premaxilla. The presence of the mandibular lingual tori and limited opening does complicate the future re-fabrication of the lower partial denture. Referral to a prosthodontist was again discussed.  Assessments: 1. Chronic periodontitis with bone loss 2.  Accretions-minimal  3. Multiple abfraction lesions involving the mandibular anterior teeth. 4. Xerostomia 5. Bilateral mandibular lingual tori 6. Decreased maximum interincisal opening 7. Poor occlusal scheme 8. History of previous radiation therapy with risk for osteoradionecrosis to the radiation therapy areas.  Plan/Recommendations: 1. I discussed the risks, benefits, and complications of various treatment options with the patient in relationship to his medical and dental conditions and previous radiation therapy. The patient currently wishes to defer all further dental treatment at this time to include restoration of flexure lesions, new lower partial denture fabrication and referral to a prosthodontist for evaluation of treatment options.  Patient agrees to return to clinic for an exam and cleaning in approximately 6 months. Patient is to call as needed if problems arise.  Lenn Cal, DDS

## 2014-09-04 NOTE — Patient Instructions (Signed)

## 2014-12-04 ENCOUNTER — Other Ambulatory Visit: Payer: Self-pay | Admitting: Urology

## 2014-12-24 NOTE — Patient Instructions (Addendum)
[place]  12/24/2014   Your procedure is scheduled on:   12-31-2014   Enter through [facility]  Entrance and follow signs to [facility] Arrive at     1245 PM.  (Limit 1 person with you).  Call this number if you have problems the morning of surgery: [phone]  .     Remember: Follow any bowel prep instructions per MD office.    Do not eat food/ or drink: After Midnight.  Exception: may have clear liquids:up to 6 Hours before arrival. Nothing after: 0800 AM  Clear liquids include soda, tea, black coffee, apple or grape juice, broth.  Take these medicines the morning of surgery with A SIP OF WATER: Tamsulosin. Finasteride. No Diabetic meds am of surgery.       Do not wear jewelry,   Do not wear deodorant, lotions, powders, or perfumes.   Men may shave face and neck    Do not bring valuables to the hospital.(Hospital is not responsible for lost valuables).  Contacts, dentures or removable bridgework, body piercing, hair pins may not be worn into surgery.  Leave suitcase in the car. After surgery it may be brought to your room.  For patients admitted to the hospital, checkout time is 11:00 AM the day of discharge.          Please read over the following fact sheets that you were given:    [place] Allowed                                                                     Foods Excluded  Coffee and tea, regular and decaf                             liquids that you cannot  Plain Jell-O in any flavor                                             see through such as: Fruit ices (not with fruit pulp)                                     milk, soups, orange juice  Iced Popsicles                                    All solid food Carbonated beverages, regular and diet                                    Cranberry, grape and apple juices Sports drinks like Gatorade Lightly seasoned clear broth or consume(fat free) Sugar, honey  syrup  Sample Menu Breakfast  Lunch                                     Supper Cranberry juice                    Beef broth                            Chicken broth Jell-O                                     Grape juice                           Apple juice Coffee or tea                        Jell-O                                      Popsicle                                                Coffee or tea                        Coffee or tea  _____________________________________________________________________           [facility] - Preparing for Surgery Before surgery, you can play an important role.  Because skin is not sterile, your skin needs to be as free of germs as possible.  You can reduce the number of germs on your skin by washing with CHG (chlorahexidine gluconate) soap before surgery.  CHG is an antiseptic cleaner which kills germs and bonds with the skin to continue killing germs even after washing. Please DO NOT use if you have an allergy to CHG or antibacterial soaps.  If your skin becomes reddened/irritated stop using the CHG and inform your nurse when you arrive at Short Stay. Do not shave (including legs and underarms) for at least 48 hours prior to the first CHG shower.  You may shave your face/neck. Please follow these instructions carefully:  1.  Shower with CHG Soap the night before surgery and the  morning of Surgery.  2.  If you choose to wash your hair, wash your hair first as usual with your  normal  shampoo.  3.  After you shampoo, rinse your hair and body thoroughly to remove the  shampoo.                           4.  Use CHG as you would any other liquid soap.  You can apply chg directly  to the skin and wash                       Gently with a scrungie or clean washcloth.  5.  Apply the CHG Soap to your body ONLY FROM THE NECK DOWN.   Do not use on  face/ open                           Wound or open sores. Avoid contact with  eyes, ears mouth and genitals (private parts).                       Wash face,  Genitals (private parts) with your normal soap.             6.  Wash thoroughly, paying special attention to the area where your surgery  will be performed.  7.  Thoroughly rinse your body with warm water from the neck down.  8.  DO NOT shower/wash with your normal soap after using and rinsing off  the CHG Soap.                9.  Pat yourself dry with a clean towel.            10.  Wear clean pajamas.            11.  Place clean sheets on your bed the night of your first shower and do not  sleep with pets. Day of Surgery : Do not apply any lotions/deodorants the morning of surgery.  Please wear clean clothes to the hospital/surgery center.  FAILURE TO FOLLOW THESE INSTRUCTIONS MAY RESULT IN THE CANCELLATION OF YOUR SURGERY PATIENT SIGNATURE_________________________________  NURSE SIGNATURE__________________________________  ________________________________________________________________________

## 2014-12-26 ENCOUNTER — Encounter (HOSPITAL_COMMUNITY)
Admission: RE | Admit: 2014-12-26 | Discharge: 2014-12-26 | Disposition: A | Discharge status: Home / Self Care | Payer: Medicare HMO | Source: Ambulatory Visit | Attending: Urology | Admitting: Urology

## 2014-12-26 ENCOUNTER — Encounter (HOSPITAL_COMMUNITY): Payer: Self-pay

## 2014-12-26 DIAGNOSIS — N400 Enlarged prostate without lower urinary tract symptoms: Secondary | ICD-10-CM | POA: Insufficient documentation

## 2014-12-26 DIAGNOSIS — R9431 Abnormal electrocardiogram [ECG] [EKG]: Secondary | ICD-10-CM | POA: Diagnosis not present

## 2014-12-26 DIAGNOSIS — Z01812 Encounter for preprocedural laboratory examination: Secondary | ICD-10-CM | POA: Diagnosis not present

## 2014-12-26 HISTORY — DX: Unspecified osteoarthritis, unspecified site: M19.90

## 2014-12-26 LAB — CBC
HCT: 38.7 % — ABNORMAL LOW (ref 39.0–52.0)
Hemoglobin: 12.9 g/dL — ABNORMAL LOW (ref 13.0–17.0)
MCH: 28.5 pg (ref 26.0–34.0)
MCHC: 33.3 g/dL (ref 30.0–36.0)
MCV: 85.4 fL (ref 78.0–100.0)
Platelets: 125 10*3/uL — ABNORMAL LOW (ref 150–400)
RBC: 4.53 MIL/uL (ref 4.22–5.81)
RDW: 15.2 % (ref 11.5–15.5)
WBC: 4.3 10*3/uL (ref 4.0–10.5)

## 2014-12-26 LAB — BASIC METABOLIC PANEL
ANION GAP: 4 — AB (ref 5–15)
BUN: 13 mg/dL (ref 6–20)
CO2: 27 mmol/L (ref 22–32)
Calcium: 9.1 mg/dL (ref 8.9–10.3)
Chloride: 109 mmol/L (ref 101–111)
Creatinine, Ser: 1.11 mg/dL (ref 0.61–1.24)
GFR calc Af Amer: >60 mL/min (ref >60)
Glucose, Bld: 103 mg/dL — ABNORMAL HIGH (ref 65–99)
Potassium: 4.9 mmol/L (ref 3.5–5.1)
Sodium: 140 mmol/L (ref 135–145)

## 2014-12-26 NOTE — Progress Notes (Signed)
CBC results done 12/26/14 faxed via [facility] to Dr Alinda Money.

## 2014-12-27 NOTE — Progress Notes (Signed)
Final EKG done 12/26/2014 in EPIC.

## 2014-12-28 NOTE — H&P (Signed)
  History of Present Illness Edward Newton is a 74 year old gentleman with the following urologic history:    1) BPH/LUTS: He has a history of benign prostatic hyperplasia. His prostate volume has been previously measured at 100.6 cc by ultrasound. He has had significant voiding symptoms including weak stream and urgency, with a baseline IPSS of 17. He had been on combination medical therapy with Flomax and Avodart, and noticed a significant improvement. He has previously tried to discontinue his alpha-blocker and noted significant worsening of his symptoms and therefore has remained on combined therapy.  His symptoms progressed despite medical therapy and he has chosen to proceed with surgical treatment.  Current treatment: Finasteride, Tamsulosin Prior treatment: Jalyn (insurance stopped covering)   2) Elevated PSA: He does have a history of an elevated PSA at 4.7 prior to 5-alpha-reductase inhibitor therapy. His PSA decreased to a new baseline of 2.16 in March 2008 after beginning finasteride. He has undergone a negative biopsy in the spring of 2007.     Past Medical History Problems  1. History of Dyslipidemia (E78.5) 2. History of diabetes mellitus (Z86.39) 3. History of hypertension (Z86.79)  Current Meds 1. Atorvastatin Calcium 20 MG Oral Tablet;  Therapy: 13Apr2015 to Recorded 2. Dutasteride-Tamsulosin HCl - 0.5-0.4 MG Oral Capsule; TAKE 1 CAPSULE Daily;  Therapy: [id number] to (Evaluate:13Aug2016)  Requested for: 41YSA6301; Last  Rx:15Feb2016 Ordered 3. Fluorometholone 0.1 % Ophthalmic Suspension;  Therapy: 13Apr2015 to Recorded 4. Lisinopril 10 MG Oral Tablet;  Therapy: 60FUX3235 to Recorded 5. MetFORMIN HCl - 500 MG Oral Tablet;  Therapy: 57DUK0254 to Recorded 6. Restasis 0.05 % Ophthalmic Emulsion;  Therapy: 20Feb2015 to Recorded 7. Tradjenta 5 MG Oral Tablet;  Therapy: 12Apr2016 to Recorded  Allergies Medication  1. No Known Drug Allergies  Family History Problems   1. No pertinent family history : Mother 2. Denied: Family history of Prostate Cancer  Social History Problems  1. Marital History - Currently Married 2. Never A Smoker 3. Occupation:   DRILLER 4. Denied: History of Tobacco Use    Physical Exam Constitutional: Well nourished and well developed . No acute distress.  ENT:. The ears and nose are normal in appearance.  Neck: The appearance of the neck is normal and no neck mass is present.  Pulmonary: No respiratory distress and normal respiratory rhythm and effort.  Cardiovascular: Heart rate and rhythm are normal . No peripheral edema.  Abdomen: The abdomen is soft and nontender. No masses are palpated. No CVA tenderness. No hernias are palpable. No hepatosplenomegaly noted.  Neuro/Psych:. Mood and affect are appropriate.       Assessment Assessed  1. Benign prostatic hyperplasia (BPH) with straining on urination (N40.1,R39.16)    Discussion/Summary 1. BPH/LUTS: He will undergo TURP.

## 2014-12-31 ENCOUNTER — Encounter (HOSPITAL_COMMUNITY)
Admission: RE | Disposition: A | Discharge status: Home / Self Care | Payer: Self-pay | Source: Ambulatory Visit | Attending: Urology

## 2014-12-31 ENCOUNTER — Encounter (HOSPITAL_COMMUNITY): Admission: AD | Payer: Self-pay | Source: Ambulatory Visit

## 2014-12-31 ENCOUNTER — Encounter (HOSPITAL_COMMUNITY): Payer: Self-pay | Admitting: *Deleted

## 2014-12-31 ENCOUNTER — Observation Stay (HOSPITAL_COMMUNITY)
Admission: RE | Admit: 2014-12-31 | Discharge: 2015-01-01 | Disposition: A | Discharge status: Home / Self Care | Payer: Medicare HMO | Source: Ambulatory Visit | Attending: Urology | Admitting: Urology

## 2014-12-31 ENCOUNTER — Ambulatory Visit (HOSPITAL_COMMUNITY): Payer: Medicare HMO | Admitting: Anesthesiology

## 2014-12-31 DIAGNOSIS — I10 Essential (primary) hypertension: Secondary | ICD-10-CM | POA: Insufficient documentation

## 2014-12-31 DIAGNOSIS — Z79899 Other long term (current) drug therapy: Secondary | ICD-10-CM | POA: Diagnosis not present

## 2014-12-31 DIAGNOSIS — N138 Other obstructive and reflux uropathy: Secondary | ICD-10-CM | POA: Diagnosis not present

## 2014-12-31 DIAGNOSIS — N320 Bladder-neck obstruction: Secondary | ICD-10-CM | POA: Diagnosis present

## 2014-12-31 DIAGNOSIS — E785 Hyperlipidemia, unspecified: Secondary | ICD-10-CM | POA: Diagnosis not present

## 2014-12-31 DIAGNOSIS — N210 Calculus in bladder: Secondary | ICD-10-CM | POA: Diagnosis not present

## 2014-12-31 DIAGNOSIS — N401 Enlarged prostate with lower urinary tract symptoms: Principal | ICD-10-CM | POA: Insufficient documentation

## 2014-12-31 DIAGNOSIS — E119 Type 2 diabetes mellitus without complications: Secondary | ICD-10-CM | POA: Insufficient documentation

## 2014-12-31 HISTORY — PX: TRANSURETHRAL RESECTION OF PROSTATE: SHX73

## 2014-12-31 LAB — GLUCOSE, CAPILLARY
GLUCOSE-CAPILLARY: 68 mg/dL (ref 65–99)
GLUCOSE-CAPILLARY: 99 mg/dL (ref 65–99)
Glucose-Capillary: 89 mg/dL (ref 65–99)
Glucose-Capillary: 104 mg/dL — ABNORMAL HIGH (ref 65–99)
Glucose-Capillary: 119 mg/dL — ABNORMAL HIGH (ref 65–99)
Glucose-Capillary: 171 mg/dL — ABNORMAL HIGH (ref 65–99)

## 2014-12-31 SURGERY — EGD (ESOPHAGOGASTRODUODENOSCOPY)
Anesthesia: General

## 2014-12-31 SURGERY — TURP (TRANSURETHRAL RESECTION OF PROSTATE)
Anesthesia: General

## 2014-12-31 MED ORDER — FENTANYL CITRATE (PF) 250 MCG/5ML IJ SOLN
INTRAMUSCULAR | Status: AC
  Filled 2014-12-31: qty 5

## 2014-12-31 MED ORDER — NEOSTIGMINE METHYLSULFATE 10 MG/10ML IV SOLN
INTRAVENOUS | Status: DC | PRN
  Administered 2014-12-31: 4 mg via INTRAVENOUS

## 2014-12-31 MED ORDER — LIDOCAINE HCL (CARDIAC) 20 MG/ML IV SOLN
INTRAVENOUS | Status: DC | PRN
  Administered 2014-12-31: 50 mg via INTRAVENOUS

## 2014-12-31 MED ORDER — CIPROFLOXACIN IN D5W 400 MG/200ML IV SOLN
400.0000 mg | Freq: Two times a day (BID) | INTRAVENOUS | Status: DC
  Administered 2015-01-01: 400 mg via INTRAVENOUS
  Filled 2014-12-31: qty 200

## 2014-12-31 MED ORDER — DEXAMETHASONE SODIUM PHOSPHATE 10 MG/ML IJ SOLN
INTRAMUSCULAR | Status: AC
  Filled 2014-12-31: qty 1

## 2014-12-31 MED ORDER — ONDANSETRON HCL 4 MG/2ML IJ SOLN: 4.0000 mg | INTRAMUSCULAR | Status: DC | PRN

## 2014-12-31 MED ORDER — SODIUM CHLORIDE 0.9 % IV SOLN: 250.0000 mL | INTRAVENOUS | Status: DC | PRN

## 2014-12-31 MED ORDER — SODIUM CHLORIDE 0.9 % IJ SOLN: 3.0000 mL | Freq: Two times a day (BID) | INTRAMUSCULAR | Status: DC

## 2014-12-31 MED ORDER — PROPOFOL 10 MG/ML IV BOLUS
INTRAVENOUS | Status: AC
  Filled 2014-12-31: qty 20

## 2014-12-31 MED ORDER — ATORVASTATIN CALCIUM 10 MG PO TABS
20.0000 mg | ORAL_TABLET | Freq: Every day | ORAL | Status: DC
  Administered 2014-12-31: 20 mg via ORAL
  Filled 2014-12-31: qty 2

## 2014-12-31 MED ORDER — EPHEDRINE SULFATE 50 MG/ML IJ SOLN
INTRAMUSCULAR | Status: DC | PRN
  Administered 2014-12-31 (×2): 5 mg via INTRAVENOUS
  Administered 2014-12-31: 10 mg via INTRAVENOUS

## 2014-12-31 MED ORDER — INSULIN ASPART 100 UNIT/ML ~~LOC~~ SOLN
0.0000 [IU] | SUBCUTANEOUS | Status: DC
  Administered 2014-12-31 – 2015-01-01 (×3): 3 [IU] via SUBCUTANEOUS

## 2014-12-31 MED ORDER — DOCUSATE SODIUM 100 MG PO CAPS
100.0000 mg | ORAL_CAPSULE | Freq: Two times a day (BID) | ORAL | Status: DC
  Administered 2014-12-31 – 2015-01-01 (×2): 100 mg via ORAL
  Filled 2014-12-31 (×2): qty 1

## 2014-12-31 MED ORDER — ONDANSETRON HCL 4 MG/2ML IJ SOLN: 4.0000 mg | Freq: Once | INTRAMUSCULAR | Status: DC | PRN

## 2014-12-31 MED ORDER — DEXTROSE 50 % IV SOLN
25.0000 mL | Freq: Once | INTRAVENOUS | Status: AC
  Administered 2014-12-31: 25 mL via INTRAVENOUS

## 2014-12-31 MED ORDER — 0.9 % SODIUM CHLORIDE (POUR BTL) OPTIME
TOPICAL | Status: DC | PRN
  Administered 2014-12-31: 1000 mL

## 2014-12-31 MED ORDER — FENTANYL CITRATE (PF) 100 MCG/2ML IJ SOLN
INTRAMUSCULAR | Status: DC | PRN
  Administered 2014-12-31 (×3): 50 ug via INTRAVENOUS
  Administered 2014-12-31: 100 ug via INTRAVENOUS

## 2014-12-31 MED ORDER — FENTANYL CITRATE (PF) 100 MCG/2ML IJ SOLN: 25.0000 ug | INTRAMUSCULAR | Status: DC | PRN

## 2014-12-31 MED ORDER — SODIUM CHLORIDE 0.9 % IR SOLN
Status: DC | PRN
  Administered 2014-12-31: 45000 mL

## 2014-12-31 MED ORDER — SODIUM CHLORIDE 0.9 % IJ SOLN: 3.0000 mL | INTRAMUSCULAR | Status: DC | PRN

## 2014-12-31 MED ORDER — HYDROCODONE-ACETAMINOPHEN 5-325 MG PO TABS: 1.0000 | ORAL_TABLET | ORAL | Status: DC | PRN

## 2014-12-31 MED ORDER — PROPOFOL 10 MG/ML IV BOLUS
INTRAVENOUS | Status: DC | PRN
  Administered 2014-12-31: 150 mg via INTRAVENOUS
  Administered 2014-12-31 (×2): 50 mg via INTRAVENOUS

## 2014-12-31 MED ORDER — CIPROFLOXACIN IN D5W 400 MG/200ML IV SOLN
400.0000 mg | INTRAVENOUS | Status: AC
  Administered 2014-12-31: 400 mg via INTRAVENOUS

## 2014-12-31 MED ORDER — CIPROFLOXACIN IN D5W 400 MG/200ML IV SOLN
INTRAVENOUS | Status: AC
  Filled 2014-12-31: qty 200

## 2014-12-31 MED ORDER — ROCURONIUM BROMIDE 100 MG/10ML IV SOLN
INTRAVENOUS | Status: DC | PRN
  Administered 2014-12-31: 30 mg via INTRAVENOUS
  Administered 2014-12-31: 10 mg via INTRAVENOUS

## 2014-12-31 MED ORDER — OXYCODONE-ACETAMINOPHEN 5-325 MG PO TABS: 1.0000 | ORAL_TABLET | ORAL | Status: DC | PRN

## 2014-12-31 MED ORDER — GLYCOPYRROLATE 0.2 MG/ML IJ SOLN
INTRAMUSCULAR | Status: DC | PRN
  Administered 2014-12-31: .6 mg via INTRAVENOUS

## 2014-12-31 MED ORDER — BELLADONNA ALKALOIDS-OPIUM 16.2-60 MG RE SUPP: 1.0000 | Freq: Four times a day (QID) | RECTAL | Status: DC | PRN

## 2014-12-31 MED ORDER — SUCCINYLCHOLINE CHLORIDE 20 MG/ML IJ SOLN
INTRAMUSCULAR | Status: DC | PRN
  Administered 2014-12-31: 100 mg via INTRAVENOUS

## 2014-12-31 MED ORDER — DIPHENHYDRAMINE HCL 50 MG/ML IJ SOLN: 12.5000 mg | Freq: Four times a day (QID) | INTRAMUSCULAR | Status: DC | PRN

## 2014-12-31 MED ORDER — HYDROCHLOROTHIAZIDE 25 MG PO TABS
25.0000 mg | ORAL_TABLET | Freq: Every day | ORAL | Status: DC
  Administered 2014-12-31 – 2015-01-01 (×2): 25 mg via ORAL
  Filled 2014-12-31 (×2): qty 1

## 2014-12-31 MED ORDER — DIPHENHYDRAMINE HCL 12.5 MG/5ML PO ELIX: 12.5000 mg | ORAL_SOLUTION | Freq: Four times a day (QID) | ORAL | Status: DC | PRN

## 2014-12-31 MED ORDER — SODIUM CHLORIDE 0.45 % IV SOLN
INTRAVENOUS | Status: DC
  Administered 2014-12-31: 19:00:00 via INTRAVENOUS

## 2014-12-31 MED ORDER — ONDANSETRON HCL 4 MG/2ML IJ SOLN
INTRAMUSCULAR | Status: DC | PRN
  Administered 2014-12-31: 4 mg via INTRAVENOUS

## 2014-12-31 MED ORDER — LACTATED RINGERS IV SOLN
INTRAVENOUS | Status: DC
  Administered 2014-12-31: 1000 mL via INTRAVENOUS
  Administered 2014-12-31: 16:00:00 via INTRAVENOUS

## 2014-12-31 MED ORDER — DOCUSATE SODIUM 100 MG PO CAPS: 100.0000 mg | ORAL_CAPSULE | Freq: Two times a day (BID) | ORAL | Status: DC

## 2014-12-31 MED ORDER — GLYCOPYRROLATE 0.2 MG/ML IJ SOLN
INTRAMUSCULAR | Status: AC
  Filled 2014-12-31: qty 4

## 2014-12-31 MED ORDER — DEXTROSE 50 % IV SOLN
INTRAVENOUS | Status: AC
  Filled 2014-12-31: qty 50

## 2014-12-31 MED ORDER — ONDANSETRON HCL 4 MG/2ML IJ SOLN
INTRAMUSCULAR | Status: AC
  Filled 2014-12-31: qty 2

## 2014-12-31 MED ORDER — NEOSTIGMINE METHYLSULFATE 10 MG/10ML IV SOLN
INTRAVENOUS | Status: AC
  Filled 2014-12-31: qty 1

## 2014-12-31 MED ORDER — ACETAMINOPHEN 325 MG PO TABS: 650.0000 mg | ORAL_TABLET | ORAL | Status: DC | PRN

## 2014-12-31 SURGICAL SUPPLY — 17 items
BAG URINE DRAINAGE (UROLOGICAL SUPPLIES) ×3 IMPLANT
BAG URO CATCHER STRL LF (DRAPE) ×3 IMPLANT
CATH HEMA 3WAY 30CC 22FR COUDE (CATHETERS) ×3 IMPLANT
ELECT REM PT RETURN 9FT ADLT (ELECTROSURGICAL) ×3
ELECTRODE REM PT RTRN 9FT ADLT (ELECTROSURGICAL) ×1 IMPLANT
GLOVE BIOGEL M STRL SZ7.5 (GLOVE) ×3 IMPLANT
GOWN STRL REUS W/TWL LRG LVL3 (GOWN DISPOSABLE) ×3 IMPLANT
GOWN STRL REUS W/TWL XL LVL3 (GOWN DISPOSABLE) ×3 IMPLANT
HOLDER FOLEY CATH W/STRAP (MISCELLANEOUS) IMPLANT
KIT ASPIRATION TUBING (SET/KITS/TRAYS/PACK) ×3 IMPLANT
LOOP CUT BIPOLAR 24F LRG (ELECTROSURGICAL) ×3 IMPLANT
MANIFOLD NEPTUNE II (INSTRUMENTS) ×3 IMPLANT
PACK CYSTO (CUSTOM PROCEDURE TRAY) ×3 IMPLANT
SUT ETHILON 3 0 PS 1 (SUTURE) IMPLANT
SYRINGE IRR TOOMEY STRL 70CC (SYRINGE) ×3 IMPLANT
TUBING CONNECTING 10 (TUBING) ×2 IMPLANT
TUBING CONNECTING 10' (TUBING) ×1

## 2014-12-31 NOTE — Discharge Instructions (Addendum)
1. You may see some blood in the urine and may have some burning with urination for 48-72 hours. You also may notice that you have to urinate more frequently or urgently after your procedure which is normal.  2. You should call should you develop an inability urinate, fever > 101, persistent nausea and vomiting that prevents you from eating or drinking to stay hydrated.  3. If you have a catheter, you will be taught how to take care of the catheter by the nursing staff prior to discharge from the hospital.  You may periodically feel a strong urge to void with the catheter in place.  This is a bladder spasm and most often can occur when having a bowel movement or moving around. It is typically self-limited and usually will stop after a few minutes.  You may use some Vaseline or Neosporin around the tip of the catheter to reduce friction at the tip of the penis. You may also see some blood in the urine.  A very small amount of blood can make the urine look quite red.  As long as the catheter is draining well, there usually is not a problem.  However, if the catheter is not draining well and is bloody, you should call the office [phone]) to notify us.  You have a Foley catheter in place which drains urine out of your bladder. There are two parts: one part has a number and likely a colored band around it - this port should NOT be manipulated; the other piece connects to the drainage tubing and drainage bag. Before discharge from the hospital, your nurse will instruct you how to care for your foley catheter.   A foley catheter drains your bladder by gravity. The drainage bag should always be below the level of your bladder. If you are wearing a leg bag, it should be below your waist, and at night, the bag should lay on the floor next to you in bed.   Sometimes, a piece of tissue or a blood clot can get caught in the foley catheter and cause it not to drain properly. In this case, you may leak urine around  the catheter and you may feel like your bladder is full. In this case, you should disconnect the catheter from the drainage bag and flush the catheter with ~30cc of saline (available at [facility]). If this doesnt help, you should come in to be evaluated.  Other times, even though your foley catheter is draining well, you have the feeling that you have a full bladder, and you may leak around your catheter during painful bladder spasms. If this is the case, a medication called oxybutynin (or Ditropan) may help. It is normal to see some blood in your urine from time to time when you have a foley catheter. This can be due to irritation from the foley catheter inside the bladder. However, if your urine is the color of tomato juice with quarter-sized clots, this can clog the catheter, and you should call us for instructions. A physician will likely need to evaluate you.  If you are unable to get in touch with anyone and you feel it truly is an emergency, proceed to the nearest ER or call an ambulance.

## 2014-12-31 NOTE — Op Note (Addendum)
Preoperative diagnosis: 1. Bladder outlet obstruction secondary to BPH  Postoperative diagnosis:  1. Bladder outlet obstruction secondary to BPH 2. Bladder calculus  Procedure:  1. Cystoscopy 2. Transurethral resection of the prostate 3. Removal of bladder calculus (1.5 cm)  Surgeon: Roxy Horseman, Brooke Bonito. M.D.  Resident: Dr. Verdis Frederickson  Anesthesia: General  Complications: None  EBL: Minimal  Specimens: 1. Prostate chips  Disposition of specimens: Pathology  Indication: Edward Newton is a patient with bladder outlet obstruction secondary to benign prostatic hyperplasia. After reviewing the management options for treatment, he elected to proceed with the above surgical procedure(s). We have discussed the potential benefits and risks of the procedure, side effects of the proposed treatment, the likelihood of the patient achieving the goals of the procedure, and any potential problems that might occur during the procedure or recuperation. Informed consent has been obtained.  Description of procedure:  The patient was taken to the operating room and general anesthesia was induced.  The patient was placed in the dorsal lithotomy position, prepped and draped in the usual sterile fashion, and preoperative antibiotics were administered. A preoperative time-out was performed.   Cystourethroscopy was performed.  The patient's urethra was examined and was bilobar prostatic hypertrophy with a large intravesical obstructing median lobe.   The bladder was then systematically examined in its entirety. There was no evidence of any bladder tumors, stones, or other mucosal pathology except for moderate trabeculation.  The ureteral orifices were identified and marked so as to be avoided during the procedure.  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  The prostate adenoma from the bladder neck back to the verumontanum was resected  beginning at the six o'clock position and then extended to include the right and left lobes of the prostate and anterior prostate. Care was taken not to resect distal to the verumontanum.  At the end of the procedure, 1.5 cm bladder calculus was noted and was able to be removed from the bladder via catheter irrigation.  Hemostasis was then achieved with the cautery and the bladder was emptied and reinspected with no significant bleeding noted at the end of the procedure.    A 3 way catheter was then placed into the bladder and placed on continuous bladder irrigation.  The patient appeared to tolerate the procedure well and without complications.  The patient was able to be awakened and transferred to the recovery unit in satisfactory condition.

## 2014-12-31 NOTE — Plan of Care (Signed)
Problem: Phase I Progression Outcomes Goal: Tolerating diet Outcome: Progressing Tolerating full liquid diet

## 2014-12-31 NOTE — Progress Notes (Signed)
Patient is in OR for urologic procedure.  We were called urgently due to a piece of plastic from laryngoscope breaking off during intubation.  ? In lungs or stomach.  Bronchoscopy was performed and plastic could not be found.  CRNA requesting that Dr. Olevia Perches perform EGD to see if it can be found while patient is still intubated, under general anesthesia, in the OR.Marland Kitchen  Procedure canceled, the plastic piece has been found, no further plans for EGD.  Delfin Edis MD, [facility]  [phone]

## 2014-12-31 NOTE — Anesthesia Preprocedure Evaluation (Addendum)
Anesthesia Evaluation  Patient identified by MRN, date of birth, ID band Patient awake    Reviewed: Allergy & Precautions, NPO status , Patient's Chart, lab work & pertinent test results  History of Anesthesia Complications Negative for: history of anesthetic complications  Airway Mallampati: II  TM Distance: >3 FB Neck ROM: Full    Dental no notable dental hx. (+) Dental Advisory Given, Edentulous Upper, Poor Dentition   Pulmonary neg pulmonary ROS, former smoker,  breath sounds clear to auscultation  Pulmonary exam normal       Cardiovascular hypertension, Pt. on medications Normal cardiovascular examRhythm:Regular Rate:Normal     Neuro/Psych negative neurological ROS  negative psych ROS   GI/Hepatic negative GI ROS, Neg liver ROS,   Endo/Other  diabetes, Type 2  Renal/GU negative Renal ROS  negative genitourinary   Musculoskeletal  (+) Arthritis -,   Abdominal   Peds negative pediatric ROS (+)  Hematology negative hematology ROS (+)   Anesthesia Other Findings   Reproductive/Obstetrics negative OB ROS                            Anesthesia Physical Anesthesia Plan  ASA: II  Anesthesia Plan: General   Post-op Pain Management:    Induction: Intravenous  Airway Management Planned: Oral ETT  Additional Equipment:   Intra-op Plan:   Post-operative Plan: Extubation in OR  Informed Consent: I have reviewed the patients History and Physical, chart, labs and discussed the procedure including the risks, benefits and alternatives for the proposed anesthesia with the patient or authorized representative who has indicated his/her understanding and acceptance.   Dental advisory given  Plan Discussed with: CRNA  Anesthesia Plan Comments:         Anesthesia Quick Evaluation

## 2014-12-31 NOTE — Anesthesia Procedure Notes (Signed)

## 2014-12-31 NOTE — Transfer of Care (Signed)
Immediate Anesthesia Transfer of Care Note  Patient: Edward Newton  Procedure(s) Performed: Procedure(s): TRANSURETHRAL RESECTION OF THE PROSTATE (TURP) (N/A)  Patient Location: PACU  Anesthesia Type:General  Level of Consciousness: awake, alert , oriented and patient cooperative  Airway & Oxygen Therapy: Patient Spontanous Breathing and Patient connected to face mask oxygen  Post-op Assessment: Report given to RN, Post -op Vital signs reviewed and stable and Patient moving all extremities  Post vital signs: Reviewed and stable  Last Vitals:  Filed Vitals:   12/31/14 1239  BP: 137/89  Pulse: 77  Temp: 36.8 C  Resp: 18    Complications: No apparent anesthesia complications

## 2015-01-01 ENCOUNTER — Encounter (HOSPITAL_COMMUNITY): Payer: Self-pay | Admitting: Urology

## 2015-01-01 DIAGNOSIS — N401 Enlarged prostate with lower urinary tract symptoms: Secondary | ICD-10-CM | POA: Diagnosis not present

## 2015-01-01 LAB — GLUCOSE, CAPILLARY
Glucose-Capillary: 110 mg/dL — ABNORMAL HIGH (ref 65–99)
Glucose-Capillary: 157 mg/dL — ABNORMAL HIGH (ref 65–99)
Glucose-Capillary: 189 mg/dL — ABNORMAL HIGH (ref 65–99)

## 2015-01-01 MED ORDER — PNEUMOCOCCAL VAC POLYVALENT 25 MCG/0.5ML IJ INJ: 0.5000 mL | INJECTION | INTRAMUSCULAR | Status: DC

## 2015-01-01 NOTE — Anesthesia Postprocedure Evaluation (Signed)
  Anesthesia Post-op Note  Patient: Edward Newton  Procedure(s) Performed: Procedure(s) (LRB): TRANSURETHRAL RESECTION OF THE PROSTATE (TURP) (N/A)    Anesthesia Type: General   Post-op Vital Signs: stable   Complications: No apparent anesthesia complications

## 2015-01-01 NOTE — Progress Notes (Signed)
Patient ID: Edward Newton, male   DOB: 04-Jul-1940, 74 y.o.   MRN: [id number]  1 Day Post-Op Subjective: Pt without complaints overnight.  He did have a few clots that were expelled with change in position of the patient.  No clot retention.  Currently, urine is light pink but on fairly brisk CBI.  Objective: Vital signs in last 24 hours: Temp:  [97.6 F (36.4 C)-98.2 F (36.8 C)] 97.8 F (36.6 C) (07/18 2148) Pulse Rate:  [55-77] 55 (07/18 2148) Resp:  [10-21] 14 (07/18 2148) BP: (132-145)/(76-95) 132/76 mmHg (07/18 2148) SpO2:  [95 %-100 %] 99 % (07/18 2148) Weight:  [75.751 kg (167 lb)] 75.751 kg (167 lb) (07/18 1315)  Intake/Output from previous day: 07/18 0701 - 07/19 0700 In: 73419 [P.O.:240; I.V.:2990; IV Piggyback:200] Out: [phone] [Urine:33850; Blood:25] Intake/Output this shift:    Physical Exam:  General: Alert and oriented CV: RRR Lungs: Clear Abdomen: Soft, ND GU: Urine red without clots upon stopping CBI Ext: NT, No erythema  I irrigated his catheter.  No clots in bladder noted.  CBI restarted but at slow drip.  Studies/Results: No results found.  Assessment/Plan: POD#1 s/p TURP - I have slowed CBI and instructed nursing staff to titrate off - Will likely proceed with a voiding trial later this morning.     Bodey Frizell,LES 01/01/2015, 7:01 AM

## 2015-01-01 NOTE — Discharge Summary (Signed)
Date of admission: 12/31/2014  Date of discharge: 01/01/2015  Admission diagnosis: BPH with obstruction  Discharge diagnosis: BPH with obstruction  Secondary diagnoses: Elevated PSA, HLD, HTN, DM (all POA)  History and Physical: For full details, please see admission history and physical. Briefly, Edward Newton is a 74 y.o. year old patient with BPH with obstruction who underwent a transurethral resection of the prostate 12/31/14.   Hospital Course:  BPH with obstruction- Patient underwent uncomplicated transurethral resection of prostate 12/31/14. He was left on CBI overnight POD0/POD1. Urine was pink the morning of POD1 on light CBI which was discontinued. He was rechecked and his urine was not clear enough to warrant a voiding trial as there were still small dime sized clots and kool-aid colored urine. We will have him return in a few days for TOV.  Laboratory values: No results for input(s): HGB, HCT in the last 72 hours. No results for input(s): CREATININE in the last 72 hours.  Disposition: Home  Discharge instruction:   You have a Foley catheter in place which drains urine out of your bladder. There are two parts: one part has a number and likely a colored band around it - this port should NOT be manipulated; the other piece connects to the drainage tubing and drainage bag. Before discharge from the hospital, your nurse will instruct you how to care for your foley catheter.   A foley catheter drains your bladder by gravity. The drainage bag should always be below the level of your bladder. If you are wearing a leg bag, it should be below your waist, and at night, the bag should lay on the floor next to you in bed.   Sometimes, a piece of tissue or a blood clot can get caught in the foley catheter and cause it not to drain properly. In this case, you may leak urine around the catheter and you may feel like your bladder is full. In this case, you should disconnect the catheter from the  drainage bag and flush the catheter with ~30cc of saline (available at [facility]). If this doesn't help, you should come in to be evaluated.  Other times, even though your foley catheter is draining well, you have the feeling that you have a full bladder, and you may leak around your catheter during painful bladder spasms. If this is the case, a medication called oxybutynin (or Ditropan) may help. It is normal to see some blood in your urine from time to time when you have a foley catheter. This can be due to irritation from the foley catheter inside the bladder. However, if your urine is the color of tomato juice with quarter-sized clots, this can clog the catheter, and you should call us for instructions. A physician will likely need to evaluate you.  If you are unable to get in touch with anyone and you feel it truly is an emergency, proceed to the nearest ER or call an ambulance.   1. You may see some blood in the urine and may have some burning with urination for 48-72 hours. You also may notice that you have to urinate more frequently or urgently after your procedure which is normal.  2. You should call should you develop an inability urinate, fever > 101, persistent nausea and vomiting that prevents you from eating or drinking to stay hydrated.  3. If you have a catheter, you will be taught how to take care of the catheter by the nursing staff prior to discharge from  the hospital.  You may periodically feel a strong urge to void with the catheter in place.  This is a bladder spasm and most often can occur when having a bowel movement or moving around. It is typically self-limited and usually will stop after a few minutes.  You may use some Vaseline or Neosporin around the tip of the catheter to reduce friction at the tip of the penis. You may also see some blood in the urine.  A very small amount of blood can make the urine look quite red.  As long as the catheter is draining well, there usually is not a  problem.  However, if the catheter is not draining well and is bloody, you should call the office [phone] to notify us.    Discharge medications:    Medication List    STOP taking these medications        aspirin EC 81 MG tablet     finasteride 5 MG tablet  Commonly known as:  PROSCAR     tamsulosin 0.4 MG Caps capsule  Commonly known as:  FLOMAX      TAKE these medications        atorvastatin 20 MG tablet  Commonly known as:  LIPITOR  Take 20 mg by mouth at bedtime.     docusate sodium 100 MG capsule  Commonly known as:  COLACE  Take 1 capsule (100 mg total) by mouth 2 (two) times daily.     hydrochlorothiazide 25 MG tablet  Commonly known as:  HYDRODIURIL  TAKE ONE TABLET BY MOUTH ONCE DAILY     metFORMIN 500 MG tablet  Commonly known as:  GLUCOPHAGE  Take 500 mg by mouth 2 (two) times daily with a meal.     oxyCODONE-acetaminophen 5-325 MG per tablet  Commonly known as:  ROXICET  Take 1 tablet by mouth every 4 (four) hours as needed for severe pain.     sodium-potassium bicarbonate Tbef dissolvable tablet  Commonly known as:  ALKA-SELTZER GOLD  Take 1 tablet by mouth daily as needed.     tadalafil 20 MG tablet  Commonly known as:  CIALIS  Take 0.5-1 tablets (10-20 mg total) by mouth every other day as needed for erectile dysfunction.     TRADJENTA 5 MG Tabs tablet  Generic drug:  linagliptin  Take 5 mg by mouth daily.        Followup:      Follow-up Information    Follow up with Dutch Gray, MD.   Specialty:  Urology   Why:  02/05/15 at 10:15 am   Contact information:   [place] [address] [phone]

## 2015-01-02 ENCOUNTER — Telehealth: Payer: Self-pay | Admitting: Family Medicine

## 2015-01-02 NOTE — Telephone Encounter (Signed)
Received in-basket result for prostate biopsy, I called and let him know there was no evidence of cancer (since result came to my inbox I wanted to make sure he was aware). -Dr. Lamar Benes

## 2015-01-04 ENCOUNTER — Ambulatory Visit (HOSPITAL_COMMUNITY): Admission: AD | Admit: 2015-01-04 | Payer: Medicare Other | Source: Ambulatory Visit | Admitting: Internal Medicine

## 2015-03-04 ENCOUNTER — Encounter (HOSPITAL_COMMUNITY): Payer: Self-pay | Admitting: Dentistry

## 2015-03-12 ENCOUNTER — Encounter (INDEPENDENT_AMBULATORY_CARE_PROVIDER_SITE_OTHER): Payer: Self-pay

## 2015-03-12 ENCOUNTER — Ambulatory Visit (HOSPITAL_COMMUNITY): Payer: Medicaid - Dental | Admitting: Dentistry

## 2015-03-12 ENCOUNTER — Encounter (HOSPITAL_COMMUNITY): Payer: Self-pay | Admitting: Dentistry

## 2015-03-12 VITALS — BP 140/93 | HR 74 | Temp 98.2°F

## 2015-03-12 DIAGNOSIS — K031 Abrasion of teeth: Secondary | ICD-10-CM

## 2015-03-12 DIAGNOSIS — Z972 Presence of dental prosthetic device (complete) (partial): Secondary | ICD-10-CM

## 2015-03-12 DIAGNOSIS — K117 Disturbances of salivary secretion: Secondary | ICD-10-CM

## 2015-03-12 DIAGNOSIS — K0530 Chronic periodontitis, unspecified: Secondary | ICD-10-CM

## 2015-03-12 DIAGNOSIS — K08409 Partial loss of teeth, unspecified cause, unspecified class: Secondary | ICD-10-CM

## 2015-03-12 DIAGNOSIS — Z923 Personal history of irradiation: Secondary | ICD-10-CM

## 2015-03-12 DIAGNOSIS — M264 Malocclusion, unspecified: Secondary | ICD-10-CM

## 2015-03-12 DIAGNOSIS — K036 Deposits [accretions] on teeth: Secondary | ICD-10-CM

## 2015-03-12 DIAGNOSIS — R682 Dry mouth, unspecified: Secondary | ICD-10-CM

## 2015-03-12 DIAGNOSIS — M270 Developmental disorders of jaws: Secondary | ICD-10-CM

## 2015-03-12 NOTE — Patient Instructions (Signed)
1. Brush teeth after meals and at bedtime. Floss at bedtime. 2. Return to clinic as scheduled. Call if problems arise before then. Dr. Enrique Sack

## 2015-03-12 NOTE — Progress Notes (Signed)
03/12/2015  Patient:            Edward Newton Date of Birth:  [birth year] MRN:                [id number]  BP 140/93 mmHg  Pulse 74  Temp(Src) 98.2 F (36.8 C) (Oral)    Edward Newton presents for periodic oral exam and adult prophylaxis. Patient denies having any significant dental problems.  Past Medical History  Diagnosis Date  . Hyperlipidemia   . BPH (benign prostatic hyperplasia)   . Hypertension 10/19/2008  . Diabetes mellitus without complication Onset -7/98/92  . Arthritis     left shoulder   . 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 Edison Nasuti; S/P Radiation therapy 10/26/02 thru 12/11/02 6600cGy   .Marland Kitchen Allergies  Allergen Reactions  . Lisinopril     Lip swelling    Current Outpatient Prescriptions  Medication Sig Dispense Refill  . atorvastatin (LIPITOR) 20 MG tablet Take 20 mg by mouth at bedtime.    . docusate sodium (COLACE) 100 MG capsule Take 1 capsule (100 mg total) by mouth 2 (two) times daily. 30 capsule 0  . hydrochlorothiazide (HYDRODIURIL) 25 MG tablet TAKE ONE TABLET BY MOUTH ONCE DAILY 90 tablet 0  . linagliptin (TRADJENTA) 5 MG TABS tablet Take 5 mg by mouth daily.    . metFORMIN (GLUCOPHAGE) 500 MG tablet Take 500 mg by mouth 2 (two) times daily with a meal.    . oxyCODONE-acetaminophen (ROXICET) 5-325 MG per tablet Take 1 tablet by mouth every 4 (four) hours as needed for severe pain. 30 tablet 0  . sodium-potassium bicarbonate (ALKA-SELTZER GOLD) TBEF dissolvable tablet Take 1 tablet by mouth daily as needed.    . tadalafil (CIALIS) 20 MG tablet Take 0.5-1 tablets (10-20 mg total) by mouth every other day as needed for erectile dysfunction. (Patient not taking: Reported on 12/24/2014) 5 tablet 11   No current facility-administered medications for this visit.   C/C: "It's time for a cleaning."  HPI: Patient indicates that he has not had any significant dental problems. Patient denies having any acute  dental pain or toothache symptoms.  Patient denies having any problems with the upper complete denture. Patient wears his lower partial denture from "time to time".  DENTAL EXAM  General: Patient is a well-developed, well-nourished male in no acute distress. Vitals: BP 140/93 mmHg  Pulse 74  Temp(Src) 98.2 F (36.8 C) (Oral)  Extraoral Exam: Left neck is consistent with previous neck dissection and parotidectomy. There is no right neck lymphadenopathy. The patient denies any acute TMJ symptoms. Decreased maximum interincisal opening as before. Intraoral Exam: Mild Xerostomia. There is no significant denture irritation noted today. Patient has bilateral mandibular lingual tori. The patient has loose, flabby tissue involving the premaxilla alveolar ridge. Dentition: Patient has tooth numbers 22 through 29 remaining. Caries: The patient has no dental caries. The patient does have significant abfraction lesions involving the facial and lingual aspects of tooth numbers 22 through 27. We again discussed potential for future fracture of the mandibular anterior teeth due to loss of tooth structure. We also discussed possible pulpal involvement due to the flexure lesions. Will again consider restoration of the flexure lesions in the future. Periodontal: Chronic periodontal disease with accretions. Plus mobility of 23,24,25. Minimal plaque and calculus. Adult prophylaxis performed with CAVITRON and hand curettes. Oral Hygiene instructions provided. Clear- Fluoride varnish applied with instructions to avoid eating for one hour. Endodontic:  Patient denies acute pulpitis symptoms.  C&B: There are no crown restorations. We again discuss crown on tooth #29 to fit the existing partial denture. Patient refuses referral at this time. Prosthodontic: Patient has an upper complete denture that is acceptable and lower partial that is less than ideal. Pressure indicating paste was applied to upper denture and lower partial  with adjustments made as needed. [place]  The occlusion of the upper and lower complete dentures was evaluated and adjusted to remove base interferences from the acrylic. Patient accepted results. Occlusion: The patient has a less than ideal occlusion at this time.  The patient bites into the maxillary anterior occlusion with some displacement of the upper denture secondary to the loose, flabby tissue of the premaxilla. The presence of the mandibular lingual tori and limited opening does complicate the future re-fabrication of the lower partial denture. Referral to a prosthodontist was again discussed.  Assessments: 1. Chronic periodontitis with bone loss 2. Accretions-minimal  3. Multiple abfraction lesions involving the mandibular anterior teeth. 4. Xerostomia 5. Bilateral mandibular lingual tori 6. Decreased maximum interincisal opening 7. Poor occlusal scheme 8. History of previous radiation therapy with risk for osteoradionecrosis to the radiation therapy areas.  Plan/Recommendations: 1. I discussed the risks, benefits, and complications of various treatment options with the patient in relationship to his medical and dental conditions and previous radiation therapy. The patient currently wishes to defer all further dental treatment at this time to include restoration of flexure lesions, new lower partial denture fabrication and referral to a prosthodontist for evaluation of treatment options.  Patient agrees to return to clinic for an exam and cleaning in approximately 6 months. Patient is to call as needed if problems arise.  Lenn Cal, DDS

## 2015-09-23 ENCOUNTER — Encounter (HOSPITAL_COMMUNITY): Payer: Self-pay | Admitting: Dentistry

## 2015-09-24 ENCOUNTER — Ambulatory Visit (HOSPITAL_COMMUNITY): Payer: Medicaid - Dental | Admitting: Dentistry

## 2015-09-24 ENCOUNTER — Encounter (HOSPITAL_COMMUNITY): Payer: Self-pay | Admitting: Dentistry

## 2015-09-24 VITALS — BP 134/75 | HR 63 | Temp 98.2°F

## 2015-09-24 DIAGNOSIS — K031 Abrasion of teeth: Secondary | ICD-10-CM

## 2015-09-24 DIAGNOSIS — K036 Deposits [accretions] on teeth: Secondary | ICD-10-CM

## 2015-09-24 DIAGNOSIS — K029 Dental caries, unspecified: Secondary | ICD-10-CM

## 2015-09-24 DIAGNOSIS — R682 Dry mouth, unspecified: Secondary | ICD-10-CM

## 2015-09-24 DIAGNOSIS — K0530 Chronic periodontitis, unspecified: Secondary | ICD-10-CM

## 2015-09-24 DIAGNOSIS — K08409 Partial loss of teeth, unspecified cause, unspecified class: Secondary | ICD-10-CM

## 2015-09-24 DIAGNOSIS — Z972 Presence of dental prosthetic device (complete) (partial): Secondary | ICD-10-CM

## 2015-09-24 DIAGNOSIS — K117 Disturbances of salivary secretion: Secondary | ICD-10-CM

## 2015-09-24 DIAGNOSIS — K045 Chronic apical periodontitis: Secondary | ICD-10-CM

## 2015-09-24 DIAGNOSIS — Z923 Personal history of irradiation: Secondary | ICD-10-CM

## 2015-09-24 DIAGNOSIS — K121 Other forms of stomatitis: Secondary | ICD-10-CM

## 2015-09-24 DIAGNOSIS — M270 Developmental disorders of jaws: Secondary | ICD-10-CM

## 2015-09-24 NOTE — Patient Instructions (Addendum)
1. Brush after meals and at bedtime. 2. Floss at bedtime. 3. Return to clinic as scheduled for dental restorations. 4. Will need referral to Endodontist for Root canal therapy #26 5. Call if problems arise before then.  Dr. Enrique Sack

## 2015-09-24 NOTE — Progress Notes (Signed)
09/24/2015  Patient:            Edward Newton Date of Birth:  1941/05/14 MRN:                [id number]  BP 134/75 mmHg  Pulse 63  Temp(Src) 98.2 F (36.8 C) (Oral)    [name] presents for periodic oral exam and adult prophylaxis. Patient denies having any medical changes. Medications were updated.  Patient did have a transurethral resection of the prostate in July 2016 with Dr. Alinda Money.  Patient is complaining of a history of denture irritation to the lower left quadrant alveolar ridge. Nothing is hurting today.  Past Medical History  Diagnosis Date  . Hyperlipidemia   . BPH (benign prostatic hyperplasia)   . Hypertension 10/19/2008  . Diabetes mellitus without complication ([place]) Onset -08/03/13  . Arthritis     left shoulder   . 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 Edison Nasuti; S/P Radiation therapy 10/26/02 thru 12/11/02 6600cGy   Past Surgical History  Procedure Laterality Date  . Parotid gland tumor excision    . Hernia repair    . Rotator cuff repair    . Transurethral resection of prostate N/A 12/31/2014    Procedure: TRANSURETHRAL RESECTION OF THE PROSTATE (TURP);  Surgeon: Raynelle Bring, MD;  Location: [facility];  Service: Urology;  Laterality: N/A;    .Marland Kitchen Allergies  Allergen Reactions  . Lisinopril     Lip swelling    Current Outpatient Prescriptions  Medication Sig Dispense Refill  . atorvastatin (LIPITOR) 20 MG tablet Take 20 mg by mouth at bedtime.    . hydrochlorothiazide (HYDRODIURIL) 25 MG tablet TAKE ONE TABLET BY MOUTH ONCE DAILY 90 tablet 0  . metFORMIN (GLUCOPHAGE) 500 MG tablet Take 500 mg by mouth 2 (two) times daily with a meal.    . sodium-potassium bicarbonate (ALKA-SELTZER GOLD) TBEF dissolvable tablet Take 1 tablet by mouth daily as needed.    . linagliptin (TRADJENTA) 5 MG TABS tablet Take 5 mg by mouth daily. Reported on 09/24/2015    . tadalafil (CIALIS) 20 MG tablet Take 0.5-1  tablets (10-20 mg total) by mouth every other day as needed for erectile dysfunction. (Patient not taking: Reported on 12/24/2014) 5 tablet 11   No current facility-administered medications for this visit.    C/C: "It's time for a check up."  HPI: Patient indicates that he has  been having some denture irritation to the lower left alveolar ridge. Patient indicates that he used salt water rinses which helped resolve the discomfort. Patient currently denies any acute problems today.  Patient denies having any acute dental pain or toothache symptoms.  Patient is wearing his upper complete denture and lower partial denture. Patient uses denture adhesive to help keep the upper complete denture in.  DENTAL EXAM  General: Patient is a well-developed, well-nourished male in no acute distress. Vitals: BP 134/75 mmHg  Pulse 63  Temp(Src) 98.2 F (36.8 C) (Oral)  Extraoral Exam: Left neck is consistent with previous neck dissection and parotidectomy. There is no right neck lymphadenopathy. The patient denies any acute TMJ symptoms. Decreased maximum interincisal opening measured at 30 mm. Intraoral Exam: Mild Xerostomia. There is lower left posteriror alveolar ridge denture irritation noted today. Patient has bilateral mandibular lingual tori. The patient has loose, flabby tissue involving the premaxilla alveolar ridge. Dentition: Patient has tooth numbers 22 through 29 remaining. Caries: The patient has multiple dental caries and  abfraction lesions. Patient will need multiple dental restorations.  Periodontal: Chronic periodontal disease with accretions. Plus mobility of 23,24,25. Minimal plaque and calculus accumulations.  Adult prophylaxis performed with CAVITRON and hand curettes. Oral Hygiene instructions provided. Clear- Fluoride varnish applied with instructions to avoid eating for one hour. Endodontic: Patient denies acute pulpitis symptoms.  Periapical pathology and radiolucency is noted at the  apex of tooth #26. C&B: There are no crown restorations. We discussed need for future crown and bridge restorations in the future with referral. Prosthodontic: Patient has an upper complete denture that is acceptable and lower partial that is less than ideal. Pressure indicating paste was applied to upper denture and lower partial with adjustments made as needed. [place] Patient accepts results. Occlusion: The patient has a less than ideal occlusion at this time.  The patient bites into the maxillary anterior occlusion with some displacement of the upper denture secondary to the loose, flabby tissue of the premaxilla. The presence of the mandibular lingual tori and limited opening does complicate the future re-fabrication of the lower partial denture. Referral to a prosthodontist was again discussed. Radiographic Interpretation:   4 lower periapical radiographs were obtained. Dental caries are noted. Moderate bone loss is noted. Periapical radiolucency at the apex of tooth #26 is noted. Radiolucencies consistent with either caries or abfraction lesions are noted. Dental restorations are noted.  Assessments: 1. Chronic periodontitis with bone loss 2. Accretions-minimal  3. Multiple abfraction lesions involving the mandibular anterior teeth. 4. Multiple dental caries 5. 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 Xerostomia 7. Bilateral mandibular lingual tori 8. Decreased maximum interincisal opening 9. Poor occlusal scheme 10. History of previous radiation therapy with risk for osteoradionecrosis to the radiation therapy areas. 11. Denture irritation  Plan/Recommendations: 1. I discussed the risks, benefits, and complications of various treatment options with the patient in relationship to his medical and dental conditions and previous radiation therapy. The patient currently wishes to  proceed with dental restorations and referral to an endodontist for root canal therapy  #26. Will consider crown restoration #26 as indicated although with amount of bone loss this may not be an ideal treatment at this time. The patient is return to clinic for start of dental restorations next week, but will call if acute problems arise with tooth #26 before then.    Lenn Cal, DDS

## 2015-09-30 ENCOUNTER — Encounter (HOSPITAL_COMMUNITY): Payer: Self-pay | Admitting: Dentistry

## 2015-10-08 ENCOUNTER — Ambulatory Visit (HOSPITAL_COMMUNITY): Payer: Medicaid - Dental | Admitting: Dentistry

## 2015-10-08 ENCOUNTER — Encounter (HOSPITAL_COMMUNITY): Payer: Self-pay | Admitting: Dentistry

## 2015-10-08 VITALS — BP 145/72 | HR 92 | Temp 98.0°F

## 2015-10-08 DIAGNOSIS — K029 Dental caries, unspecified: Secondary | ICD-10-CM

## 2015-10-08 DIAGNOSIS — K031 Abrasion of teeth: Secondary | ICD-10-CM

## 2015-10-08 NOTE — Progress Notes (Signed)
10/08/2015  Patient:            Edward Newton Date of Birth:  [birth year] MRN:                [id number]   BP 145/72 mmHg  Pulse 92  Temp(Src) 98 F (36.7 C) (Oral)  Jaco Treas is a 75 year old male that presents for dental restorations. Patient was recently seen for periodic oral examination and dental caries and abfraction lesions were noted. Patient now presents for dental restorations of the mandibular anterior teeth. We discussed the risks, benefits, potential complications of the dental restorations to include possible pulpal involvement and need for further root canal therapy. Patient understands the risks and agrees to proceed with treatment as planned.  Procedure: 36 mg lidocaine with 0.018 mg of epinephrine via infiltration and mental nerve block. [id number]  #23   I/Resin AE, Primer, DBA, Tetric Ceram EVO Shade A 4.0. [place] [id number]  #24  DL/Resin AE, Primer, DBA, Tetric Ceram EVO Shade A 4.0. [place] [id number]  #24   F/Resin AE, Primer, DBA, Tetric Ceram EVO Shade A 4.0. [place] [id number]  #24     I/Resin  AE, Primer, DBA, Tetric Ceram EVO Shade A 4.0. [place] [id number]  #25  I/Resin AE, Primer, DBA, Tetric Ceram EVO Shade A 4.0. [place] Evaluated lower cast partial denture lingual framework and no adjustment was needed.  Evaluated occlusion and adjustments were made as needed. Patient tolerated the procedure well.  Patient is to return to clinic for resin restorations on tooth #'s 25 and 29 and then patient will be referred to an endodontist for root canal therapy on tooth #26. Patient to call if problems or toothache symptoms arise before then.  Lenn Cal, DDS

## 2015-10-08 NOTE — Patient Instructions (Addendum)
Use salt water rinses as needed to aid healing with gum irritation. Avoid biting lips due to numbness of the lower lip at this time. Brush teeth after meals and at bedtime. Floss at that time. Call if problems arise with toothache symptoms.  Lenn Cal, DDS

## 2015-10-22 ENCOUNTER — Encounter (HOSPITAL_COMMUNITY): Payer: Self-pay | Admitting: Dentistry

## 2015-10-29 ENCOUNTER — Encounter (INDEPENDENT_AMBULATORY_CARE_PROVIDER_SITE_OTHER): Payer: Self-pay

## 2015-10-29 ENCOUNTER — Ambulatory Visit (HOSPITAL_COMMUNITY): Payer: Self-pay | Admitting: Dentistry

## 2015-10-29 ENCOUNTER — Encounter (HOSPITAL_COMMUNITY): Payer: Self-pay | Admitting: Dentistry

## 2015-10-29 VITALS — BP 140/83 | HR 87 | Temp 98.2°F

## 2015-10-29 DIAGNOSIS — K029 Dental caries, unspecified: Secondary | ICD-10-CM

## 2015-10-29 DIAGNOSIS — Z923 Personal history of irradiation: Secondary | ICD-10-CM

## 2015-10-29 DIAGNOSIS — R682 Dry mouth, unspecified: Secondary | ICD-10-CM

## 2015-10-29 DIAGNOSIS — K117 Disturbances of salivary secretion: Secondary | ICD-10-CM

## 2015-10-29 DIAGNOSIS — K031 Abrasion of teeth: Secondary | ICD-10-CM

## 2015-10-29 NOTE — Patient Instructions (Signed)
Rinse with salt water as needed to aid healing. Use over-the-counter pain medication as needed for discomfort. Follow-up with Dr. Huntley Dec for root canal therapy #26 and call me when definitive restoration is needed. Call if acute problems arise before then. Dr. Enrique Sack

## 2015-10-29 NOTE — Progress Notes (Signed)
10/29/2015  Patient:            Mihailo Albrecht Date of Birth:  06/13/1941 MRN:                [id number]   BP 140/83 mmHg  Pulse 87  Temp(Src) 98.2 F (36.8 C) (Oral)  Clesson Lawver is a 75 year old male that presents for dental restorations. Patient was recently seen for periodic oral examination and dental caries and abfraction lesions were noted. Patient now presents for dental restorations of the mandibular anterior teeth. We discussed the risks, benefits, potential complications of the dental restorations to include possible pulpal involvement and need for future root canal therapy. Patient understands the risks and agrees to proceed with treatment as planned.  Procedure: 36 mg lidocaine with 0.018 mg of epinephrine via infiltration and inferior alveolar nerve block. [id number]  #25   F/Resin AE, Primer, DBA, Tetric Ceram EVO Shade A 4.0. [place] [id number]  #25  L/Resin AE, Primer, DBA, Tetric Ceram EVO Shade A 4.0. [place] [id number]  #29   MB/Resin AE, Primer, DBA, Tetric Ceram EVO Shade A 4.0. [place]      Evaluated lower cast partial denture lingual framework and minimal adjustment was needed. [place] framework. Patient tolerated the procedure well.  Patient was referred to Dr. Huntley Dec for Root canal therapy #26 and was given estimate of treatment costs.  Patient may not be able to afford this cost and will reschedule to later date when he has funds availalbe. Patient to call if problems or toothache symptoms arise before then.  Lenn Cal, DDS

## 2015-12-05 ENCOUNTER — Encounter: Payer: Self-pay | Admitting: Family Medicine

## 2015-12-05 ENCOUNTER — Ambulatory Visit (INDEPENDENT_AMBULATORY_CARE_PROVIDER_SITE_OTHER): Payer: Medicare Other | Admitting: Family Medicine

## 2015-12-05 VITALS — BP 140/77 | HR 81 | Temp 98.0°F | Ht 70.0 in | Wt 175.0 lb

## 2015-12-05 DIAGNOSIS — Z23 Encounter for immunization: Secondary | ICD-10-CM

## 2015-12-05 DIAGNOSIS — E7800 Pure hypercholesterolemia, unspecified: Secondary | ICD-10-CM | POA: Diagnosis not present

## 2015-12-05 DIAGNOSIS — R739 Hyperglycemia, unspecified: Secondary | ICD-10-CM | POA: Diagnosis not present

## 2015-12-05 DIAGNOSIS — I10 Essential (primary) hypertension: Secondary | ICD-10-CM

## 2015-12-05 DIAGNOSIS — E119 Type 2 diabetes mellitus without complications: Secondary | ICD-10-CM | POA: Diagnosis not present

## 2015-12-05 LAB — LIPID PANEL
CHOL/HDL RATIO: 3.5 ratio (ref <=5.0)
Cholesterol: 168 mg/dL (ref 125–200)
HDL: 48 mg/dL (ref >=40)
LDL Cholesterol: 107 mg/dL (ref <130)
Triglycerides: 63 mg/dL (ref <150)
VLDL: 13 mg/dL (ref <30)

## 2015-12-05 LAB — POCT GLYCOSYLATED HEMOGLOBIN (HGB A1C): HEMOGLOBIN A1C: 6.5

## 2015-12-05 LAB — BASIC METABOLIC PANEL WITH GFR
BUN: 16 mg/dL (ref 7–25)
CALCIUM: 9.1 mg/dL (ref 8.6–10.3)
CO2: 28 mmol/L (ref 20–31)
Chloride: 105 mmol/L (ref 98–110)
Creat: 1.23 mg/dL — ABNORMAL HIGH (ref 0.70–1.18)
GFR, EST NON AFRICAN AMERICAN: 58 mL/min — AB (ref >=60)
GFR, Est African American: 67 mL/min (ref >=60)
Glucose, Bld: 102 mg/dL — ABNORMAL HIGH (ref 65–99)
POTASSIUM: 4.2 mmol/L (ref 3.5–5.3)
Sodium: 139 mmol/L (ref 135–146)

## 2015-12-05 NOTE — Assessment & Plan Note (Signed)
Did not see diabetes on his problem list, but he has been taking metformin and prescribed tradjenta in the past. He has been taking the metformin. A1c today is very well controlled at 6.5%. Discussed need for eye doctor visit. Recommended continuing metformin and follow up 6 months.

## 2015-12-05 NOTE — Assessment & Plan Note (Signed)
At goal but also not taking HCTZ. Recommended at a minimum follow up every 6 months. Will check BMP today.

## 2015-12-05 NOTE — Progress Notes (Signed)
   Subjective:    Patient ID: Edward Newton, male    DOB: 04/29/1941, 75 y.o.   MRN: [id number]  HPI  CC: annual  (has not been seen since 2014)  # Hypertension:  Not taking hydrochlorothiazide  Doesn't check regularly ROS: no CP, no SOB  # Hyperlipidemia  Taking atorvastatin  No concerns  # Diabetes  He reports taking metformin. He is not sure if he was ever diagnosed with diabetes?  Does not check blood sugars  Has not noticed any   Social Hx: former smoker (quit 1992)  Review of Systems   ROS sheet filled out by patient, reviewed and is negative for all systems.  Past medical history, surgical, family, and social history reviewed and updated in the EMR as appropriate. Objective:  BP 140/77 mmHg  Pulse 81  Temp(Src) 98 F (36.7 C) (Oral)  Ht 5\' 10"  (1.778 m)  Wt 175 lb (79.379 kg)  BMI 25.11 kg/m2 Vitals and nursing note reviewed  General: no apparent distress  HEENT: PERRL, EOMI CV: normal rate, regular rhythm, no murmurs, rubs or gallop appreciated. Resp: clear to auscultation bilaterally, normal effort Abdomen: soft, thin, nontender, nondistended  Extremities: no edema. Normal muscle bulk and tone Neuro: alert and oriented, normal gait  Assessment & Plan:  Hypertension At goal but also not taking HCTZ. Recommended at a minimum follow up every 6 months. Will check BMP today.  Type 2 diabetes mellitus, controlled ([place]) Did not see diabetes on his problem list, but he has been taking metformin and prescribed tradjenta in the past. He has been taking the metformin. A1c today is very well controlled at 6.5%. Discussed need for eye doctor visit. Recommended continuing metformin and follow up 6 months.   Hyperlipidemia Seems to be tolerating atorvastatin well. He is on 20mg  currently. Will check lipid panel and if elevated will call to have him increase this to 40mg .    Return in about 6 months (around 06/05/2016).

## 2015-12-05 NOTE — Assessment & Plan Note (Signed)
Seems to be tolerating atorvastatin well. He is on 20mg  currently. Will check lipid panel and if elevated will call to have him increase this to 40mg .

## 2016-01-01 ENCOUNTER — Encounter (INDEPENDENT_AMBULATORY_CARE_PROVIDER_SITE_OTHER): Payer: Self-pay

## 2016-01-01 ENCOUNTER — Encounter (HOSPITAL_COMMUNITY): Payer: Self-pay | Admitting: Dentistry

## 2016-01-01 ENCOUNTER — Ambulatory Visit (HOSPITAL_COMMUNITY): Payer: Self-pay | Admitting: Dentistry

## 2016-01-01 DIAGNOSIS — K031 Abrasion of teeth: Secondary | ICD-10-CM

## 2016-01-01 NOTE — Patient Instructions (Addendum)
Use salt water rinses for sore gums in the area of tooth #26. Return to clinic as scheduled for periodontal maintenance procedures. Call if problems arise before then. Dr. Enrique Sack

## 2016-01-01 NOTE — Progress Notes (Signed)
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 Patient previously presented with periapical pathology #26. Patient was subsequently referred to an endodontist for root canal therapy. 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 Root canal therapy was performed by Dr. Christiane Ha Mohorn on 12/31/2015. Patient referred back for discussion of dental restoration options.  I discussed the risks, benefits, and complications of various treatment options to include resin restoration, post and core restoration with crown. We also discussed referral for post and core and crown restoration with another provider. Patient is currently interested in proceeding with dental resin restoration only due to limited economic resources. I discussed plan for providing resin as the core portion along with buildup with resin material as well. Patient is aware of limited long-term prognosis with this type of restoration and patient agrees to proceed as discussed.  Procedure: PA taken of #26. Local anesthetic applied to the gingival tissues as indicated with 18 xylocaine with epi .009 via infiltration. [id number] Lingual/Resin with resin condensed into canal to act as post. AE, Primer, DBA, Tetric Ceram EVO. A4. Contour and polish.  [id number] Facial/Resin AE,Primer, DBA, Tetric Ceram EVO A4. [place]). Adjusted lingual apron of partial framework area #26 slightly. [place] ([place]). Patient accepts results. Patient tolerated the procedure well. Return to clinic as scheduled for periodontal maintenance procedures   Lenn Cal, DDS

## 2016-03-31 ENCOUNTER — Encounter (INDEPENDENT_AMBULATORY_CARE_PROVIDER_SITE_OTHER): Payer: Self-pay

## 2016-03-31 ENCOUNTER — Encounter (HOSPITAL_COMMUNITY): Payer: Self-pay | Admitting: Dentistry

## 2016-03-31 ENCOUNTER — Ambulatory Visit (HOSPITAL_COMMUNITY): Payer: Self-pay | Admitting: Dentistry

## 2016-03-31 VITALS — BP 149/82 | HR 79 | Temp 97.7°F

## 2016-03-31 DIAGNOSIS — K031 Abrasion of teeth: Secondary | ICD-10-CM

## 2016-03-31 DIAGNOSIS — K0530 Chronic periodontitis, unspecified: Secondary | ICD-10-CM

## 2016-03-31 DIAGNOSIS — M270 Developmental disorders of jaws: Secondary | ICD-10-CM

## 2016-03-31 DIAGNOSIS — Z923 Personal history of irradiation: Secondary | ICD-10-CM

## 2016-03-31 DIAGNOSIS — M264 Malocclusion, unspecified: Secondary | ICD-10-CM

## 2016-03-31 DIAGNOSIS — K036 Deposits [accretions] on teeth: Secondary | ICD-10-CM

## 2016-03-31 DIAGNOSIS — K117 Disturbances of salivary secretion: Secondary | ICD-10-CM

## 2016-03-31 DIAGNOSIS — R682 Dry mouth, unspecified: Secondary | ICD-10-CM

## 2016-03-31 DIAGNOSIS — Z463 Encounter for fitting and adjustment of dental prosthetic device: Secondary | ICD-10-CM

## 2016-03-31 NOTE — Progress Notes (Signed)
03/31/2016  Patient:            Edward Newton Date of Birth:  Nov 22, 1940 MRN:                [id number]  BP (!) 149/82 (BP Location: Left Arm)   Pulse 79   Temp 97.7 F (36.5 C) (Oral)     [name] presents for periodic oral exam and adult prophylaxis. Patient denies having any medical changes. Medications were reviewed.  Patient is complaining of denture irritation to the maxillary anterior facial vestibule area.  Patient indicates that he recently misplaced his lower partial denture and cannot find it.  Patient denies having any acute toothache symptoms at this time.  Past Medical History:  Diagnosis Date  . Arthritis    left shoulder   . BPH (benign prostatic hyperplasia)   . Cancer [facility])    left parotid; 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 Edison Nasuti; S/P Radiation therapy 10/26/02 thru 12/11/02 [id number]  . Diabetes mellitus without complication ([place]) Onset -08/03/13  . Hyperlipidemia   . Hypertension 10/19/2008   Past Surgical History:  Procedure Laterality Date  . HERNIA REPAIR    . PAROTID GLAND TUMOR EXCISION    . ROTATOR CUFF REPAIR    . TRANSURETHRAL RESECTION OF PROSTATE N/A 12/31/2014   Procedure: TRANSURETHRAL RESECTION OF THE PROSTATE (TURP);  Surgeon: Raynelle Bring, MD;  Location: [facility] ORS;  Service: Urology;  Laterality: N/A;    .Marland Kitchen Allergies  Allergen Reactions  . Lisinopril     Lip swelling    Current Outpatient Prescriptions  Medication Sig Dispense Refill  . atorvastatin (LIPITOR) 20 MG tablet Take 20 mg by mouth at bedtime.    . hydrochlorothiazide (HYDRODIURIL) 25 MG tablet TAKE ONE TABLET BY MOUTH ONCE DAILY 90 tablet 0  . linagliptin (TRADJENTA) 5 MG TABS tablet Take 5 mg by mouth daily. Reported on 09/24/2015    . metFORMIN (GLUCOPHAGE) 500 MG tablet Take 500 mg by mouth 2 (two) times daily with a meal.    . sodium-potassium bicarbonate (ALKA-SELTZER GOLD) TBEF dissolvable tablet Take 1 tablet by mouth daily as  needed.    . tadalafil (CIALIS) 20 MG tablet Take 0.5-1 tablets (10-20 mg total) by mouth every other day as needed for erectile dysfunction. (Patient not taking: Reported on 12/24/2014) 5 tablet 11   No current facility-administered medications for this visit.     C/C: "It's time for a check up."  HPI: Patient currently denies any acute problems today.  Patient denies having any acute dental pain or toothache symptoms.  Patient is complaining of denture irritation to the maxillary anterior facial vestibule area.  Patient indicates that he recently misplaced his lower partial denture and cannot find it.  Patient is wearing his upper complete denture. Patient uses denture adhesive to help keep the upper complete denture in.  DENTAL EXAM  General: Patient is a well-developed, well-nourished male in no acute distress. Vitals: BP (!) 149/82 (BP Location: Left Arm)   Pulse 79   Temp 97.7 F (36.5 C) (Oral)   Extraoral Exam: Left neck is consistent with previous neck dissection and parotidectomy. There is no right neck lymphadenopathy. The patient denies any acute TMJ symptoms. Decreased maximum interincisal opening measured at 30 mm as before. Intraoral Exam: Mild Xerostomia. Patient has bilateral mandibular lingual tori. The patient has loose, flabby tissue involving the premaxilla alveolar ridge. There is slight erythema to the facial vestibule tissues in the area of #7-8 and 9-10.  Dentition: Patient has tooth numbers 22 through 29 remaining. Caries: The patient has multiple abfraction lesions.  The previous restorations placed appear to be acceptable.   Periodontal: Chronic periodontal disease with accretions. Plus mobility of 23,24,25. Minimal plaque and calculus accumulations.  Adult prophylaxis performed with CAVITRON and hand curettes. Oral Hygiene instructions provided. Clear- Fluoride varnish applied with instructions to avoid eating for one hour. Endodontic: Patient denies acute pulpitis  symptoms.   C&B: There are no crown restorations. We discussed possible need for future crown and bridge restorations in the future with referral. Prosthodontic: Patient has an upper complete denture that is acceptable but less than ideal and lower partial that is now missing. Pressure indicating paste was applied to upper denture and adjustments made as needed. [place]). Patient accepts results. Occlusion: The patient has a less than ideal occlusion at this time.  The patient bites into the maxillary anterior occlusion with some displacement of the upper denture secondary to the loose, flabby tissue of the premaxilla. The presence of the mandibular lingual tori and limited opening does complicate the future re-fabrication of the lower partial denture. Referral to a prosthodontist was again discussed.  Assessments: 1. Chronic periodontitis with bone loss 2. Accretions-minimal  3. Multiple abfraction lesions involving the mandibular anterior teeth. 4. Incipient Xerostomia 5. Bilateral mandibular lingual tori 6. Decreased maximum interincisal opening 7. Poor occlusal scheme 8. History of previous radiation therapy with risk for osteoradionecrosis to the radiation therapy areas.    Procedures: D0120 Dental Examination D0110 Adult prophylaxis with fluoride varnish.  Plan/Recommendations: 1. I discussed the risks, benefits, and complications of various treatment options with the patient in relationship to his medical and dental conditions and previous radiation therapy. The patient currently wishes to defer any other treatment or referral at this time. 2. The patient is return to clinic for periodontal recall in 6 months or call if problems arise before then.    Lenn Cal, DDS

## 2016-03-31 NOTE — Patient Instructions (Signed)
Plan/Recommendations: 1. I discussed the risks, benefits, and complications of various treatment options with the patient in relationship to his medical and dental conditions and previous radiation therapy. The patient currently wishes to defer any other treatment or referral at this time. 2. The patient is return to clinic for periodontal recall in 6 months or call if problems arise before then.    Lenn Cal, DDS

## 2016-05-14 ENCOUNTER — Encounter: Payer: Self-pay | Admitting: Family Medicine

## 2016-05-14 ENCOUNTER — Ambulatory Visit (INDEPENDENT_AMBULATORY_CARE_PROVIDER_SITE_OTHER): Payer: Medicare Other | Admitting: Family Medicine

## 2016-05-14 VITALS — BP 150/80 | HR 96 | Temp 99.8°F | Wt 164.0 lb

## 2016-05-14 DIAGNOSIS — E782 Mixed hyperlipidemia: Secondary | ICD-10-CM | POA: Diagnosis not present

## 2016-05-14 DIAGNOSIS — E119 Type 2 diabetes mellitus without complications: Secondary | ICD-10-CM | POA: Diagnosis not present

## 2016-05-14 DIAGNOSIS — Z23 Encounter for immunization: Secondary | ICD-10-CM

## 2016-05-14 DIAGNOSIS — I10 Essential (primary) hypertension: Secondary | ICD-10-CM

## 2016-05-14 LAB — POCT GLYCOSYLATED HEMOGLOBIN (HGB A1C): Hemoglobin A1C: 6

## 2016-05-14 MED ORDER — HYDROCHLOROTHIAZIDE 12.5 MG PO TABS: 12.5000 mg | ORAL_TABLET | Freq: Every day | ORAL | 3 refills | Status: DC

## 2016-05-14 MED ORDER — ATORVASTATIN CALCIUM 40 MG PO TABS: 40.0000 mg | ORAL_TABLET | Freq: Every day | ORAL | 3 refills | Status: DC

## 2016-05-14 NOTE — Progress Notes (Signed)
    Subjective:    Patient ID: Edward Newton, male    DOB: 1941/05/30, 75 y.o.   MRN: [id number]   CC: here for med refills  Overall feeling well. Feels like he may be coming down with something, has had headache and body aches today. Denies fevers or chills. May have picked something up from church gathering.  HLD- needs refill on statin. Denies myalgias.   DM- takes metformin 500 mg daily, denies polyuria, polydypsia. Does not check blood sugars at home. Needs metformin refilled  HTN- has not taken any blood pressure medications in over a year. Denies chest pain, shortness of breath , leg swelling.   PMH- HTN, HLD, DM Surg hx- prostectomy last year for enlarged prostate allg- ace inhibitors SH- lives with wife, no smoking alcohol or drug use. Very active outside Smoking status reviewed- never smoker  Review of Systems- see HPI   Objective:  BP (!) 150/80   Pulse 96   Temp 99.8 F (37.7 C) (Oral)   Wt 164 lb (74.4 kg)   SpO2 96%   BMI 23.53 kg/m  Vitals and nursing note reviewed  General: well nourished, in no acute distress HEENT: moist mucous membranes, no erythema or exudate in posterior oropharynx, good dentition Neck: supple, non-tender, without lymphadenopathy Cardiac: RRR, clear S1 and S2, no murmurs, rubs, or gallops Respiratory: clear to auscultation bilaterally, no increased work of breathing Abdomen: soft, nontender, nondistended, no masses or organomegaly. Bowel sounds present Extremities: no edema or cyanosis. Warm, well perfused. Skin: warm and dry, no rashes noted Neuro: alert and oriented, no focal deficits   Assessment & Plan:    Hyperlipidemia ASCVD risk 44%  -continue daily aspirin -increase statin to 40mg  daily -last lipid panel in June 2017, will recheck in 6 months   Hypertension  Not at goal of <140/90. Manual recheck 152/82   -will start 12.5 mg HCTZ today -check BMP today -follow up in 1 month  Type 2 diabetes mellitus,  controlled ([place])  A1C today is 6.0, well controlled  -stop metformin -will recheck A1C in 3 months    Return in about 4 weeks (around 06/11/2016).   Lucila Maine, DO Family Medicine Resident PGY-1

## 2016-05-14 NOTE — Patient Instructions (Signed)
   It was great meeting you today!  Overall, I think you are doing very well. I would like to increase your Lipitor to 40 mg daily. Continue taking Aspirin every day. Stop taking Metformin as your sugar is well controlled.   I am adding a blood pressure medicine. Please come see me in 1 month so we can see how this medicine is working for you.  I will check your electrolytes today.

## 2016-05-14 NOTE — Assessment & Plan Note (Addendum)
ASCVD risk 44%  -continue daily aspirin -increase statin to 40mg  daily -last lipid panel in June 2017, will recheck in 6 months

## 2016-05-14 NOTE — Assessment & Plan Note (Addendum)
  A1C today is 6.0, well controlled  -stop metformin -will recheck A1C in 3 months

## 2016-05-14 NOTE — Assessment & Plan Note (Addendum)
  Not at goal of <140/90. Manual recheck 152/82   -will start 12.5 mg HCTZ today -check BMP today -follow up in 1 month

## 2016-05-15 LAB — BASIC METABOLIC PANEL WITH GFR
BUN: 17 mg/dL (ref 7–25)
CALCIUM: 9.3 mg/dL (ref 8.6–10.3)
CO2: 28 mmol/L (ref 20–31)
Chloride: 106 mmol/L (ref 98–110)
Creat: 1.27 mg/dL — ABNORMAL HIGH (ref 0.70–1.18)
GFR, EST NON AFRICAN AMERICAN: 56 mL/min — AB (ref >=60)
GFR, Est African American: 64 mL/min (ref >=60)
GLUCOSE: 104 mg/dL — AB (ref 65–99)
POTASSIUM: 4.1 mmol/L (ref 3.5–5.3)
Sodium: 141 mmol/L (ref 135–146)

## 2016-05-18 ENCOUNTER — Encounter: Payer: Self-pay | Admitting: Family Medicine

## 2016-06-19 ENCOUNTER — Ambulatory Visit (INDEPENDENT_AMBULATORY_CARE_PROVIDER_SITE_OTHER): Payer: Medicare Other | Admitting: Family Medicine

## 2016-06-19 ENCOUNTER — Encounter: Payer: Self-pay | Admitting: Family Medicine

## 2016-06-19 VITALS — BP 138/68 | HR 61 | Temp 98.5°F | Ht 70.0 in | Wt 168.0 lb

## 2016-06-19 DIAGNOSIS — K4090 Unilateral inguinal hernia, without obstruction or gangrene, not specified as recurrent: Secondary | ICD-10-CM

## 2016-06-19 DIAGNOSIS — E119 Type 2 diabetes mellitus without complications: Secondary | ICD-10-CM | POA: Diagnosis not present

## 2016-06-19 DIAGNOSIS — I10 Essential (primary) hypertension: Secondary | ICD-10-CM

## 2016-06-19 NOTE — Progress Notes (Signed)
    Subjective:    Patient ID: Edward Newton, male    DOB: 11-25-1940, 76 y.o.   MRN: [id number]   CC: here for follow up on BP  Added 12.5 mg HCTZ at last visit and he has been tolerating this very well. Noticed some increase in urination after taking this. Denies chest pain, shortness of breath, headaches, lightheadedness. He is very active and gets frequent exercise. Enjoys hunting with his dogs.   Does endorse he has a hernia on the right side. He spoke with urology about it who said to leave it unless it begins to hurt him. He does endorse occasional pain related to hernia if he is walking a lot or coughs hard. It will bulge out occasionally but he is able to push it back in. He would like surgery at some point but does not want to have surgery in the winter months.  He has not been taking Metformin per our last visit and is pleased he is off this medication. He is agreeable to getting A1C checked in 1 month.  Smoking status reviewed- non-smoker  Review of Systems- see HPI   Objective:  BP 138/68   Pulse 61   Temp 98.5 F (36.9 C) (Oral)   Ht 5\' 10"  (1.778 m)   Wt 168 lb (76.2 kg)   SpO2 96%   BMI 24.11 kg/m  Vitals and nursing note reviewed  General: pleasant elderly gentleman, well nourished, in no acute distress Neck: supple, non-tender, without lymphadenopathy or thyromegaly Cardiac: RRR, clear S1 and S2, no murmurs, rubs, or gallops Respiratory: clear to auscultation bilaterally, no increased work of breathing Abdomen: soft, nontender, non-distended. Bowel sounds present. Right-sided groin defect, non-tender, no mass bulging out. Fullness w/ coughing. Extremities: no edema or cyanosis. Neuro: alert and oriented, no focal deficits   Assessment & Plan:    Hypertension BP at goal today with addition of HZTC 12.5mg  daily  -continue blood pressure medications -follow up in 3 months  Type 2 diabetes mellitus, controlled ([place])  Stopped metformin at last visit.  No other medications. A1C end of November 6.0  -recheck A1C end of February/early March -patient instructed to schedule lab visit for this -will see him back in 3 months  Unilateral inguinal hernia without obstruction or gangrene  No signs of incarceration. Painful with extended periods of exercise/walking.   -Patient would like referral to surgeon in next few months but not at this time -patient instructed to call for urgent referral to surgery if he is unable to push hernia back in or it becomes acutely painful     Return in about 8 weeks (around 08/12/2016) for lab visit .   Lucila Maine, DO Family Medicine Resident PGY-1

## 2016-06-19 NOTE — Assessment & Plan Note (Signed)
BP at goal today with addition of HZTC 12.5mg  daily  -continue blood pressure medications -follow up in 3 months

## 2016-06-19 NOTE — Assessment & Plan Note (Signed)
  No signs of incarceration. Painful with extended periods of exercise/walking.   -Patient would like referral to surgeon in next few months but not at this time -patient instructed to call for urgent referral to surgery if he is unable to push hernia back in or it becomes acutely painful

## 2016-06-19 NOTE — Assessment & Plan Note (Signed)
  Stopped metformin at last visit. No other medications. A1C end of November 6.0  -recheck A1C end of February/early March -patient instructed to schedule lab visit for this -will see him back in 3 months

## 2016-06-19 NOTE — Patient Instructions (Addendum)
  Please make a lab visit for the end of February/early March for a lab visit to check A1C.  Continue taking the new blood pressure medication every day.   If your hernia begins to bother you more or becomes bulging without being able to push it back in, please give me a call and we can get you in to see a surgeon.    Come back to see me in 3 months.

## 2016-08-13 ENCOUNTER — Other Ambulatory Visit (INDEPENDENT_AMBULATORY_CARE_PROVIDER_SITE_OTHER): Payer: Medicare Other

## 2016-08-13 DIAGNOSIS — E119 Type 2 diabetes mellitus without complications: Secondary | ICD-10-CM | POA: Diagnosis not present

## 2016-08-13 LAB — POCT GLYCOSYLATED HEMOGLOBIN (HGB A1C): Hemoglobin A1C: 6.5

## 2016-08-18 ENCOUNTER — Telehealth: Payer: Self-pay | Admitting: Family Medicine

## 2016-08-18 NOTE — Telephone Encounter (Signed)
LM for Edward Newton regarding A1C result. Plan to see patient for visit and recheck A1C in 3 months. Patient instructed to call back and schedule appointment.

## 2016-09-21 ENCOUNTER — Other Ambulatory Visit: Payer: Self-pay | Admitting: Family Medicine

## 2016-09-28 ENCOUNTER — Encounter (HOSPITAL_COMMUNITY): Payer: Self-pay | Admitting: Dentistry

## 2016-09-29 ENCOUNTER — Ambulatory Visit: Payer: Self-pay | Admitting: General Surgery
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
## 2016-10-12 ENCOUNTER — Encounter (HOSPITAL_COMMUNITY): Payer: Self-pay | Admitting: Dentistry

## 2016-10-12 ENCOUNTER — Ambulatory Visit (HOSPITAL_COMMUNITY): Payer: Self-pay | Admitting: Dentistry

## 2016-10-12 VITALS — BP 129/71 | HR 52 | Temp 98.0°F

## 2016-10-12 DIAGNOSIS — Z923 Personal history of irradiation: Secondary | ICD-10-CM

## 2016-10-12 DIAGNOSIS — Z85818 Personal history of malignant neoplasm of other sites of lip, oral cavity, and pharynx: Secondary | ICD-10-CM

## 2016-10-12 DIAGNOSIS — M270 Developmental disorders of jaws: Secondary | ICD-10-CM

## 2016-10-12 DIAGNOSIS — K036 Deposits [accretions] on teeth: Secondary | ICD-10-CM

## 2016-10-12 DIAGNOSIS — R682 Dry mouth, unspecified: Secondary | ICD-10-CM

## 2016-10-12 DIAGNOSIS — Z972 Presence of dental prosthetic device (complete) (partial): Secondary | ICD-10-CM

## 2016-10-12 DIAGNOSIS — K117 Disturbances of salivary secretion: Secondary | ICD-10-CM

## 2016-10-12 DIAGNOSIS — K031 Abrasion of teeth: Secondary | ICD-10-CM

## 2016-10-12 DIAGNOSIS — M264 Malocclusion, unspecified: Secondary | ICD-10-CM

## 2016-10-12 DIAGNOSIS — K0530 Chronic periodontitis, unspecified: Secondary | ICD-10-CM

## 2016-10-12 NOTE — Progress Notes (Signed)
10/12/2016  Patient:            Edward Newton Date of Birth:  1940/07/17 MRN:                [id number]  BP 129/71 (BP Location: Left Arm)   Pulse (!) 52   Temp 98 F (36.7 C) (Oral)     Edward Newton presents for periodic oral exam and adult prophylaxis. Patient recently had an inguinal hernia repair consultation with surgery pending with Dr. Rosendo Newton . Patient had his metformin discontinued. Hydrochlorothiazide 12.5 mg daily added to his prescription regimen.  Patient also takes 1 daily aspirin 81 mg tablet. Patient is not complaining of denture irritation.  Patient indicates that he previously misplaced his lower partial denture and cannot find it.  Patient denies having any acute toothache symptoms at this time.  Past Medical History:  Diagnosis Date  . Arthritis    left shoulder   . BPH (benign prostatic hyperplasia)   . 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 Edison Newton; S/P Radiation therapy 10/26/02 thru 12/11/02 6600cGy  . Diabetes mellitus without complication ([place]) Onset -08/03/13  . Hyperlipidemia   . Hypertension 10/19/2008   Past Surgical History:  Procedure Laterality Date  . HERNIA REPAIR    . PAROTID GLAND TUMOR EXCISION    . ROTATOR CUFF REPAIR    . TRANSURETHRAL RESECTION OF PROSTATE N/A 12/31/2014   Procedure: TRANSURETHRAL RESECTION OF THE PROSTATE (TURP);  Surgeon: Edward Bring, MD;  Location: [facility];  Service: Urology;  Laterality: N/A;    .Marland Kitchen Allergies  Allergen Reactions  . Lisinopril     Lip swelling    Current Outpatient Prescriptions  Medication Sig Dispense Refill  . atorvastatin (LIPITOR) 40 MG tablet Take 1 tablet (40 mg total) by mouth daily. 90 tablet 3  . hydrochlorothiazide (HYDRODIURIL) 12.5 MG tablet TAKE ONE TABLET BY MOUTH ONCE DAILY 30 tablet 3  . sodium-potassium bicarbonate (ALKA-SELTZER GOLD) TBEF dissolvable tablet Take 1 tablet by mouth daily as needed.    . tadalafil  (CIALIS) 20 MG tablet Take 0.5-1 tablets (10-20 mg total) by mouth every other day as needed for erectile dysfunction. (Patient not taking: Reported on 12/24/2014) 5 tablet 11   No current facility-administered medications for this visit.     C/C: "It's time for a check up."  HPI: Patient currently denies any acute problems today.  Patient denies having any acute dental pain or toothache symptoms.  Patient is NOT complaining of denture irritation.  Patient indicates that he has not found his lower partial denture. Patient is wearing his upper complete denture. Patient uses denture adhesive to help keep the upper complete denture in.  DENTAL EXAM  General: Patient is a well-developed, well-nourished male in no acute distress. Vitals: BP 129/71 (BP Location: Left Arm)   Pulse (!) 52   Temp 98 F (36.7 C) (Oral)   Extraoral Exam: Left neck is consistent with previous neck dissection and parotidectomy. There is no right neck lymphadenopathy. The patient denies any acute TMJ symptoms. Decreased maximum interincisal opening measured at 30 mm as before. Intraoral Exam: Mild Xerostomia. Patient has bilateral mandibular lingual tori. The patient has loose, flabby tissue involving the premaxillary alveolar ridge.  Dentition: Patient has tooth numbers 22 through 29 remaining. Caries: The patient has multiple abfraction lesions on 22,23,and 27.  The previous restorations placed appear to be acceptable.   Periodontal: Chronic periodontal disease with accretions. Plus mobility of  03,50,09. Minimal plaque and calculus accumulations.  Adult prophylaxis performed with KaVo sonic scaler and hand curettes. Oral Hygiene instructions provided. Clear- Fluoride varnish applied with instructions to avoid eating for one hour. Endodontic: Patient denies acute pulpitis symptoms.   C&B: There are no crown restorations. We discussed possible need for future crown and bridge restorations in the future with  referral. Prosthodontic: Patient has an upper complete denture that is acceptable but less than ideal and lower partial that is now missing. Pressure indicating paste was applied to upper denture and adjustments made as needed. [place] Patient accepts results. Occlusion: The patient has a less than ideal occlusion at this time.  The patient bites into the maxillary anterior occlusion with some displacement of the upper denture secondary to the loose, flabby tissue of the premaxilla. The presence of the mandibular lingual tori and limited opening does complicate the future re-fabrication of the lower partial denture. Referral to a prosthodontist was again discussed.  Assessments: 1. Chronic periodontitis with bone loss 2. Accretions-minimal  3. Multiple abfraction lesions involving the mandibular anterior teeth. 4. Incipient Xerostomia 5. Bilateral mandibular lingual tori 6. Decreased maximum interincisal opening 7. Poor occlusal scheme 8. History of previous radiation therapy with risk for osteoradionecrosis to the radiation therapy areas.     Procedures: D0120 Dental Examination D0110 Adult prophylaxis with fluoride varnish.  Plan/Recommendations: 1. I discussed the risks, benefits, and complications of various treatment options with the patient in relationship to his medical and dental conditions and previous radiation therapy. The patient currently wishes to defer any other treatment or referral at this time. 2. The patient is return to clinic for periodontal recall in 6 months or call if problems arise before then.    Lenn Cal, DDS

## 2016-10-12 NOTE — Patient Instructions (Signed)
Return to the clinic in 6 months for an exam and cleaning. Call if problems arise before then. Dr. Enrique Sack

## 2016-10-29 ENCOUNTER — Ambulatory Visit (INDEPENDENT_AMBULATORY_CARE_PROVIDER_SITE_OTHER): Payer: Medicare Other | Admitting: Family Medicine

## 2016-10-29 ENCOUNTER — Ambulatory Visit: Payer: Medicare Other | Admitting: Family Medicine

## 2016-10-29 ENCOUNTER — Ambulatory Visit (HOSPITAL_COMMUNITY)
Admission: RE | Admit: 2016-10-29 | Discharge: 2016-10-29 | Disposition: A | Discharge status: Home / Self Care | Payer: Medicare Other | Source: Ambulatory Visit | Attending: Family Medicine | Admitting: Family Medicine

## 2016-10-29 ENCOUNTER — Encounter: Payer: Self-pay | Admitting: Family Medicine

## 2016-10-29 VITALS — BP 122/84 | HR 82 | Temp 98.1°F | Ht 70.0 in | Wt 164.2 lb

## 2016-10-29 DIAGNOSIS — E119 Type 2 diabetes mellitus without complications: Secondary | ICD-10-CM | POA: Diagnosis not present

## 2016-10-29 DIAGNOSIS — I10 Essential (primary) hypertension: Secondary | ICD-10-CM

## 2016-10-29 DIAGNOSIS — R9431 Abnormal electrocardiogram [ECG] [EKG]: Secondary | ICD-10-CM | POA: Diagnosis not present

## 2016-10-29 DIAGNOSIS — M25571 Pain in right ankle and joints of right foot: Secondary | ICD-10-CM | POA: Insufficient documentation

## 2016-10-29 DIAGNOSIS — I491 Atrial premature depolarization: Secondary | ICD-10-CM | POA: Insufficient documentation

## 2016-10-29 LAB — POCT UA - MICROALBUMIN
Albumin/Creatinine Ratio, Urine, POC: <30
Creatinine, POC: 100 mg/dL
MICROALBUMIN (UR) POC: 30 mg/L

## 2016-10-29 LAB — POCT GLYCOSYLATED HEMOGLOBIN (HGB A1C): Hemoglobin A1C: 6.2

## 2016-10-29 NOTE — Assessment & Plan Note (Signed)
Likely strained ligament while gardening.  Check for gout with uric acid.

## 2016-10-29 NOTE — Progress Notes (Signed)
   Subjective:    Patient ID: Edward Newton, male    DOB: 1940/08/19, 76 y.o.   MRN: [id number]  HPI  Multiple issues: 1. DM now diet controled.  Very active and good diet.  A1C=6.2.  Due for eye exam and microalbumin screen. 2. Hypertension.  Again, good control.  No recent labs.   3. For hernia repair in one week.  Will have general anesthesia.  No recent labs or EKG. 4. Right ankle, stiff and sore from gardening.  Not red or swollen.    Review of Systems Denies CP, SOB, bowel or bladder change.  No headaches, ankle swelling.       Objective:   Physical ExamNeck supple without masses Lungs clear Cardiac RRR without m or g Abd benign. Ext no edema  Right ankle tender over lateral aspect at ant talo fibular ligament. Neuro, grossly intact. EKG- no acute changes.        Assessment & Plan:

## 2016-10-29 NOTE — Assessment & Plan Note (Signed)
Well controled.  Check labs 

## 2016-10-29 NOTE — Assessment & Plan Note (Signed)
Good control.  Cont diet and exercise.  Urine microalbumin.  Ophthalmology exam.

## 2016-10-29 NOTE — Patient Instructions (Signed)
Keep up all your good habits. I will call with the lab test results You should be fine for the hernia surgery. Please see the eye doctor and make sure he/she knows that you are diabetic and send [place] a copy of the report.

## 2016-10-30 LAB — CMP14+EGFR
A/G RATIO: 1.8 (ref 1.2–2.2)
ALK PHOS: 67 IU/L (ref 39–117)
ALT: 14 IU/L (ref 0–44)
AST: 18 IU/L (ref 0–40)
Albumin: 4.2 g/dL (ref 3.5–4.8)
BILIRUBIN TOTAL: 0.7 mg/dL (ref 0.0–1.2)
BUN / CREAT RATIO: 12 (ref 10–24)
BUN: 16 mg/dL (ref 8–27)
CHLORIDE: 102 mmol/L (ref 96–106)
CO2: 25 mmol/L (ref 18–29)
Calcium: 9.3 mg/dL (ref 8.6–10.2)
Creatinine, Ser: 1.37 mg/dL — ABNORMAL HIGH (ref 0.76–1.27)
GFR calc non Af Amer: 50 mL/min/{1.73_m2} — ABNORMAL LOW (ref >59)
GFR, EST AFRICAN AMERICAN: 58 mL/min/{1.73_m2} — AB (ref >59)
Globulin, Total: 2.4 g/dL (ref 1.5–4.5)
Glucose: 116 mg/dL — ABNORMAL HIGH (ref 65–99)
POTASSIUM: 4.2 mmol/L (ref 3.5–5.2)
Sodium: 140 mmol/L (ref 134–144)
TOTAL PROTEIN: 6.6 g/dL (ref 6.0–8.5)

## 2016-10-30 LAB — CBC
Hematocrit: 40 % (ref 37.5–51.0)
Hemoglobin: 13.6 g/dL (ref 13.0–17.7)
MCH: 29.5 pg (ref 26.6–33.0)
MCHC: 34 g/dL (ref 31.5–35.7)
MCV: 87 fL (ref 79–97)
PLATELETS: 199 10*3/uL (ref 150–379)
RBC: 4.61 x10E6/uL (ref 4.14–5.80)
RDW: 15.5 % — ABNORMAL HIGH (ref 12.3–15.4)
WBC: 4.2 10*3/uL (ref 3.4–10.8)

## 2016-10-30 LAB — URIC ACID: URIC ACID: 6.3 mg/dL (ref 3.7–8.6)

## 2017-01-28 ENCOUNTER — Other Ambulatory Visit: Payer: Self-pay | Admitting: Family Medicine

## 2017-04-12 ENCOUNTER — Encounter (INDEPENDENT_AMBULATORY_CARE_PROVIDER_SITE_OTHER): Payer: Self-pay

## 2017-04-12 ENCOUNTER — Encounter (HOSPITAL_COMMUNITY): Payer: Self-pay | Admitting: Dentistry

## 2017-04-12 ENCOUNTER — Ambulatory Visit (HOSPITAL_COMMUNITY): Payer: Self-pay | Admitting: Dentistry

## 2017-04-12 VITALS — BP 134/83 | HR 81 | Temp 97.7°F

## 2017-04-12 DIAGNOSIS — K036 Deposits [accretions] on teeth: Secondary | ICD-10-CM

## 2017-04-12 DIAGNOSIS — Z972 Presence of dental prosthetic device (complete) (partial): Secondary | ICD-10-CM

## 2017-04-12 DIAGNOSIS — Z85818 Personal history of malignant neoplasm of other sites of lip, oral cavity, and pharynx: Secondary | ICD-10-CM

## 2017-04-12 DIAGNOSIS — K0530 Chronic periodontitis, unspecified: Secondary | ICD-10-CM

## 2017-04-12 DIAGNOSIS — M264 Malocclusion, unspecified: Secondary | ICD-10-CM

## 2017-04-12 DIAGNOSIS — K031 Abrasion of teeth: Secondary | ICD-10-CM

## 2017-04-12 DIAGNOSIS — M270 Developmental disorders of jaws: Secondary | ICD-10-CM

## 2017-04-12 DIAGNOSIS — Z923 Personal history of irradiation: Secondary | ICD-10-CM

## 2017-04-12 DIAGNOSIS — K029 Dental caries, unspecified: Secondary | ICD-10-CM

## 2017-04-12 DIAGNOSIS — Z5189 Encounter for other specified aftercare: Secondary | ICD-10-CM | POA: Diagnosis not present

## 2017-04-12 NOTE — Patient Instructions (Signed)
Plan/Recommendations: 1. I discussed the risks, benefits, and complications of various treatment options with the patient in relationship to his medical and dental conditions and previous radiation therapy. The patient currently wishes to proceed with restoration of tooth #27 on the facial aspect.  Patient refuses referral to a prosthodontist at this time.  2. The patient is return to clinic for periodontal recall in 6 months or call if problems arise before then.    Lenn Cal, DDS

## 2017-04-12 NOTE — Progress Notes (Signed)
04/12/2017  Patient:            Edward Newton Date of Birth:  Dec 06, 1940 MRN:                [id number]  BP 134/83 (BP Location: Left Arm)   Pulse 81   Temp 97.7 F (36.5 C) (Oral)     [name] presents for periodic oral exam and adult prophylaxis. Patient recently had an inguinal hernia repair injury earlier in the year. There were no complications.  Patient is scheduled with outpatient surgical procedure with Dr. Lowella Dell today. Patient is not complaining of denture irritation.  Patient indicates that he previously misplaced his lower partial denture and cannot find it.  Patient denies having any acute toothache symptoms at this time.  Past Medical History:  Diagnosis Date  . Arthritis    left shoulder   . BPH (benign prostatic hyperplasia)   . 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 Edison Nasuti; S/P Radiation therapy 10/26/02 thru 12/11/02 6600cGy  . Diabetes mellitus without complication ([place]) Onset -08/03/13  . Hyperlipidemia   . Hypertension 10/19/2008   Past Surgical History:  Procedure Laterality Date  . HERNIA REPAIR    . PAROTID GLAND TUMOR EXCISION    . ROTATOR CUFF REPAIR    . TRANSURETHRAL RESECTION OF PROSTATE N/A 12/31/2014   Procedure: TRANSURETHRAL RESECTION OF THE PROSTATE (TURP);  Surgeon: Raynelle Bring, MD;  Location: [facility];  Service: Urology;  Laterality: N/A;    .Marland Kitchen Allergies  Allergen Reactions  . Lisinopril     Lip swelling    Current Outpatient Prescriptions  Medication Sig Dispense Refill  . aspirin EC 81 MG tablet Take 81 mg by mouth daily.    Marland Kitchen atorvastatin (LIPITOR) 40 MG tablet Take 1 tablet (40 mg total) by mouth daily. 90 tablet 3  . hydrochlorothiazide (HYDRODIURIL) 12.5 MG tablet TAKE 1 TABLET BY MOUTH ONCE DAILY 90 tablet 1  . sodium-potassium bicarbonate (ALKA-SELTZER GOLD) TBEF dissolvable tablet Take 1 tablet by mouth daily as needed.    . tadalafil (CIALIS) 20 MG  tablet Take 0.5-1 tablets (10-20 mg total) by mouth every other day as needed for erectile dysfunction. (Patient not taking: Reported on 12/24/2014) 5 tablet 11   No current facility-administered medications for this visit.     C/C: "It's time for a check up."  HPI: Patient currently denies any acute problems today.  Patient denies having any acute dental pain or toothache symptoms.  Patient is NOT complaining of denture irritation.  Patient indicates that he has not found his lower partial denture. Patient is wearing his upper complete denture. Patient uses denture adhesive to help keep the upper complete denture in.  DENTAL EXAM  General: Patient is a well-developed, well-nourished male in no acute distress. Vitals: BP 134/83 (BP Location: Left Arm)   Pulse 81   Temp 97.7 F (36.5 C) (Oral)   Extraoral Exam: Left neck is consistent with previous neck dissection and parotidectomy. There is no right neck lymphadenopathy. The patient denies any acute TMJ symptoms. Decreased maximum interincisal opening measured at 30 mm as before. Intraoral Exam: Mild Xerostomia. Patient has bilateral mandibular lingual tori. The patient has loose, flabby tissue involving the premaxillary alveolar ridge.  Dentition: Patient has tooth numbers 22 through 29 remaining. Caries: The patient has caries on the facial aspect of tooth #27. The patient has multiple abfraction lesions on 22, 23,and 27.  The previous restorations placed appear  to be acceptable.   Periodontal: Chronic periodontal disease with accretions. Plus mobility of 23,24,25. Minimal plaque and calculus accumulations.  Adult prophylaxis performed with Cavitron and hand curettes. [place]). Oral Hygiene instructions provided. Clear- Fluoride varnish applied with instructions to avoid eating for one hour. Endodontic: Patient denies acute pulpitis symptoms.   C&B: There are no crown restorations. We discussed possible need for future crown and bridge  restorations in the future with referral to prosthodontist. Prosthodontic: Patient has an upper complete denture that is acceptable but less than ideal and lower partial that is still missing. Pressure indicating paste was applied to upper denture and adjustments made as needed. [place] ([place]). Patient accepts results. Occlusion: The patient has a less than ideal occlusion at this time.  The patient bites into the maxillary anterior occlusion with some displacement of the upper denture secondary to the loose, flabby tissue of the premaxilla. The presence of the mandibular lingual tori and limited opening does complicate the future re-fabrication of the lower partial denture. Referral to a prosthodontist was again discussed.  Assessments: 1. Chronic periodontitis with bone loss 2. Accretions-minimal  3. Multiple abfraction lesions involving the mandibular anterior teeth. 4. Incipient Xerostomia 5. Bilateral mandibular lingual tori 6. Decreased maximum interincisal opening 7. Poor occlusal scheme 8. History of previous radiation therapy with risk for osteoradionecrosis to the radiation therapy areas. 9. Dental caries on the facial aspect of tooth #27.     Procedures: D0120 Dental Examination D0110 Adult prophylaxis with fluoride varnish.  Plan/Recommendations: 1. I discussed the risks, benefits, and complications of various treatment options with the patient in relationship to his medical and dental conditions and previous radiation therapy. The patient currently wishes to proceed with restoration of tooth #27 on the facial aspect.  Patient refuses referral to a prosthodontist at this time.  2. The patient is return to clinic for periodontal recall in 6 months or call if problems arise before then.    Lenn Cal, DDS

## 2017-04-19 ENCOUNTER — Encounter (HOSPITAL_COMMUNITY): Payer: Self-pay | Admitting: Dentistry

## 2017-04-19 ENCOUNTER — Ambulatory Visit (HOSPITAL_COMMUNITY): Payer: Medicare Other | Admitting: Dentistry

## 2017-04-19 VITALS — BP 125/70 | HR 85 | Temp 98.2°F

## 2017-04-19 DIAGNOSIS — Z923 Personal history of irradiation: Secondary | ICD-10-CM

## 2017-04-19 DIAGNOSIS — Z85818 Personal history of malignant neoplasm of other sites of lip, oral cavity, and pharynx: Secondary | ICD-10-CM

## 2017-04-19 DIAGNOSIS — K029 Dental caries, unspecified: Secondary | ICD-10-CM

## 2017-04-19 DIAGNOSIS — Z5189 Encounter for other specified aftercare: Secondary | ICD-10-CM

## 2017-04-19 NOTE — Patient Instructions (Signed)
Return to clinic for periodic exam and cleaning in 6 months. Call if problems arise before then.  Lenn Cal, DDS

## 2017-04-19 NOTE — Progress Notes (Signed)
04/19/2017  Patient Name:   Edward Newton Date of Birth:   April 03, 1941 Medical Record Number: [id number]  BP 125/70 (BP Location: Left Arm)   Pulse 85   Temp 98.2 F (36.8 C) (Oral)   Edward Newton presents for dental resin restoration #27. Patient agrees to proceed with treatment as planned.  Procedure:  36 mg Xyl;ocaine with epi .018mg  MNB and infialtration. E7614  #27 F/Resin AE, Primer, DBA, Tetric Ceram EVO A 4.0 [place] ([place]). [id number] #27 [place]/Resin AE. Primer, DBA, Tetric Ceram EVO Ceram A 4.0. Adjust occlusion and polish. Patient tolerated procedure well.   Return to clinic for periodic exam and cleaning in 6 months. Call if problems arise before then.  Lenn Cal, DDS

## 2017-08-03 ENCOUNTER — Other Ambulatory Visit: Payer: Self-pay

## 2017-08-03 ENCOUNTER — Ambulatory Visit (INDEPENDENT_AMBULATORY_CARE_PROVIDER_SITE_OTHER): Payer: Medicare Other | Admitting: Family Medicine

## 2017-08-03 VITALS — BP 125/80 | HR 80 | Temp 98.1°F | Ht 70.0 in | Wt 177.4 lb

## 2017-08-03 DIAGNOSIS — I10 Essential (primary) hypertension: Secondary | ICD-10-CM

## 2017-08-03 DIAGNOSIS — E782 Mixed hyperlipidemia: Secondary | ICD-10-CM | POA: Diagnosis not present

## 2017-08-03 DIAGNOSIS — W57XXXA Bitten or stung by nonvenomous insect and other nonvenomous arthropods, initial encounter: Secondary | ICD-10-CM

## 2017-08-03 DIAGNOSIS — E119 Type 2 diabetes mellitus without complications: Secondary | ICD-10-CM | POA: Diagnosis not present

## 2017-08-03 DIAGNOSIS — T1592XA Foreign body on external eye, part unspecified, left eye, initial encounter: Secondary | ICD-10-CM

## 2017-08-03 MED ORDER — HYDROCHLOROTHIAZIDE 12.5 MG PO TABS: 12.5000 mg | ORAL_TABLET | Freq: Every day | ORAL | 3 refills | Status: DC

## 2017-08-03 NOTE — Progress Notes (Signed)
    Subjective:    Patient ID: Edward Newton, male    DOB: 12-03-1940, 77 y.o.   MRN: [id number]   CC: yearly visit  Has been doing well since last visit. He had his hernia repaired. He is active and has been in good health. Concerns today include:  Eye- was hit in face with tree branch yesterday. His eye hurts since then he feels something is in his eye. He denies vision changes.  Tick- removed tick from his back last week. He hunts frequently in wooded areas. He believes the tick was attached <1 day. He has not had any fevers, chills, rashes, joint pains.   BP- taking HCTZ. No chest pain, SOB, DOE.   Smoking status reviewed- non-smoker  Review of Systems- see HPI   Objective:  BP 125/80   Pulse 80   Temp 98.1 F (36.7 C)   Ht 5\' 10"  (1.778 m)   Wt 177 lb 6.4 oz (80.5 kg)   SpO2 96%   BMI 25.45 kg/m  Vitals and nursing note reviewed  General: well nourished, in no acute distress HEENT: normocephalic, left eye with soft tissue swelling around orbital bone. There appears to be a small foreign body lateral to the iris. Fluorescein eye exam confirms small foreign body in sclera. MMM.  Neck: supple, non-tender, without lymphadenopathy Cardiac: RRR, clear S1 and S2, no murmurs, rubs, or gallops Respiratory: clear to auscultation bilaterally, no increased work of breathing Abdomen: soft, nontender, nondistended, +BS Extremities: no edema or cyanosis. Skin: warm and dry, no rashes noted.  Neuro: alert and oriented, no focal deficits  Assessment & Plan:    Type 2 diabetes mellitus, controlled ([place])  Will check A1C today. He is in good health and A1C goal <7. Not currently on any medications.  Hypertension  Chronic, well controlled at goal <140/90 -continue HCTZ, refilled today -check CBC, BMP today   Hyperlipidemia  Check lipid panel today. Continue statin.   4. Foreign body of left eye, initial encounter Small foreign body, likely wood, in sclera of left eye.  Vision unimpaired. Will refer patient to ophtho.  - Ambulatory referral to Ophthalmology  5. Tick bite, initial encounter No target lesion or rash noted on patients body. He is afebrile. Asked him to return to be seen if he develops rash or fever. He is in agreement. No indication for antibiotics at this time.   Return in about 6 months (around 01/31/2018).   Lucila Maine, DO Family Medicine Resident PGY-2

## 2017-08-03 NOTE — Assessment & Plan Note (Addendum)
  Chronic, well controlled at goal <140/90 -continue HCTZ, refilled today -check CBC, BMP today

## 2017-08-03 NOTE — Assessment & Plan Note (Addendum)
  Will check A1C today. He is in good health and A1C goal <7. Not currently on any medications.

## 2017-08-03 NOTE — Patient Instructions (Signed)
   It was great seeing you today!  Please go to one of the eye doctors on the list I gave you.  We will inform you of your lab results either by phone or letter.   Please return to be seen if your symptoms worsen or if you develop fever, rash, joint pains  If you have questions or concerns please do not hesitate to call at [phone].  Lucila Maine, DO PGY-2, [facility] 08/03/2017 2:12 PM

## 2017-08-04 DIAGNOSIS — H20042 Secondary noninfectious iridocyclitis, left eye: Secondary | ICD-10-CM | POA: Diagnosis not present

## 2017-08-04 LAB — LIPID PANEL
CHOLESTEROL TOTAL: 144 mg/dL (ref 100–199)
Chol/HDL Ratio: 3.5 ratio (ref 0.0–5.0)
HDL: 41 mg/dL (ref >39)
LDL CALC: 93 mg/dL (ref 0–99)
TRIGLYCERIDES: 51 mg/dL (ref 0–149)
VLDL CHOLESTEROL CAL: 10 mg/dL (ref 5–40)

## 2017-08-04 LAB — BASIC METABOLIC PANEL
BUN/Creatinine Ratio: 12 (ref 10–24)
BUN: 17 mg/dL (ref 8–27)
CO2: 26 mmol/L (ref 20–29)
CREATININE: 1.38 mg/dL — AB (ref 0.76–1.27)
Calcium: 9.5 mg/dL (ref 8.6–10.2)
Chloride: 103 mmol/L (ref 96–106)
GFR, EST AFRICAN AMERICAN: 57 mL/min/{1.73_m2} — AB (ref >59)
GFR, EST NON AFRICAN AMERICAN: 50 mL/min/{1.73_m2} — AB (ref >59)
Glucose: 89 mg/dL (ref 65–99)
Potassium: 4.1 mmol/L (ref 3.5–5.2)
SODIUM: 142 mmol/L (ref 134–144)

## 2017-08-04 LAB — CBC
HEMATOCRIT: 40.9 % (ref 37.5–51.0)
HEMOGLOBIN: 13.8 g/dL (ref 13.0–17.7)
MCH: 29.1 pg (ref 26.6–33.0)
MCHC: 33.7 g/dL (ref 31.5–35.7)
MCV: 86 fL (ref 79–97)
Platelets: 163 10*3/uL (ref 150–379)
RBC: 4.75 x10E6/uL (ref 4.14–5.80)
RDW: 15.1 % (ref 12.3–15.4)
WBC: 5.2 10*3/uL (ref 3.4–10.8)

## 2017-08-04 LAB — HEMOGLOBIN A1C
Est. average glucose Bld gHb Est-mCnc: 166 mg/dL
Hgb A1c MFr Bld: 7.4 % — ABNORMAL HIGH (ref 4.8–5.6)

## 2017-08-04 NOTE — Assessment & Plan Note (Addendum)
Check lipid panel today.  Continue statin 

## 2017-08-05 DIAGNOSIS — H20023 Recurrent acute iridocyclitis, bilateral: Secondary | ICD-10-CM | POA: Diagnosis not present

## 2017-08-06 ENCOUNTER — Telehealth: Payer: Self-pay | Admitting: Family Medicine

## 2017-08-06 MED ORDER — METFORMIN HCL 500 MG PO TABS: 500.0000 mg | ORAL_TABLET | Freq: Every day | ORAL | 3 refills | Status: DC

## 2017-08-06 NOTE — Telephone Encounter (Signed)
  Called Edward Newton to discuss lab results. Creatinine/BUN/GFR stable. A1C worsened from 6.2 to 7.4. He is in excellent health otherwise. I do think for someone who is as active and healthy as he is I would want his A1C to be better controlled to avoid issues with kidneys, neuropathies, etc. He is agreeable to going back on metformin. Will restart 500 mg daily and follow up in 3 months to recheck A1C. He agrees.  Lucila Maine, DO PGY-2, [facility] 08/06/2017 11:36 AM

## 2017-09-15 ENCOUNTER — Other Ambulatory Visit: Payer: Self-pay

## 2017-09-16 MED ORDER — ATORVASTATIN CALCIUM 40 MG PO TABS: 40.0000 mg | ORAL_TABLET | Freq: Every day | ORAL | 3 refills | Status: DC

## 2017-11-10 DIAGNOSIS — N401 Enlarged prostate with lower urinary tract symptoms: Secondary | ICD-10-CM | POA: Diagnosis not present

## 2017-11-10 DIAGNOSIS — R972 Elevated prostate specific antigen [PSA]: Secondary | ICD-10-CM | POA: Diagnosis not present

## 2017-11-10 DIAGNOSIS — R3914 Feeling of incomplete bladder emptying: Secondary | ICD-10-CM | POA: Diagnosis not present

## 2017-11-23 DIAGNOSIS — E119 Type 2 diabetes mellitus without complications: Secondary | ICD-10-CM | POA: Diagnosis not present

## 2017-11-23 LAB — HM DIABETES EYE EXAM

## 2017-11-25 ENCOUNTER — Encounter: Payer: Self-pay | Admitting: Family Medicine

## 2017-11-25 ENCOUNTER — Other Ambulatory Visit: Payer: Self-pay

## 2017-11-25 ENCOUNTER — Ambulatory Visit (INDEPENDENT_AMBULATORY_CARE_PROVIDER_SITE_OTHER): Payer: Medicare Other | Admitting: Family Medicine

## 2017-11-25 VITALS — BP 124/66 | HR 61 | Temp 97.9°F | Ht 70.0 in | Wt 171.0 lb

## 2017-11-25 DIAGNOSIS — E119 Type 2 diabetes mellitus without complications: Secondary | ICD-10-CM | POA: Diagnosis present

## 2017-11-25 LAB — POCT GLYCOSYLATED HEMOGLOBIN (HGB A1C): HbA1c, POC (controlled diabetic range): 6.9 % (ref 0.0–7.0)

## 2017-11-25 NOTE — Progress Notes (Signed)
    Subjective:    Patient ID: Edward Newton, male    DOB: 05-26-1941, 77 y.o.   MRN: [id number]   CC: follow up DM  DM- patient's last A1C mildly elevated. He was restarted on metformin however he reports he was taking this and felt very sluggish. He did not have GI issues. He stopped taking it and has felt better. He does not want to take medication. He is very active physically and has tried to cut out sweets in diet.   Getting cataract surgery sometime soon. Otherwise, no issues with health.  Smoking status reviewed- former smoker  Review of Systems- no increased thirst, hunger, or urination. No numbness/tingling in extremities    Objective:  BP 124/66   Pulse 61   Temp 97.9 F (36.6 C) (Oral)   Ht 5\' 10"  (1.778 m)   Wt 171 lb (77.6 kg)   SpO2 94%   BMI 24.54 kg/m  Vitals and nursing note reviewed  General: well nourished, in no acute distress HEENT: normocephalic, MMM Neck: supple, non-tender, without lymphadenopathy Cardiac: RRR, clear S1 and S2, no murmurs, rubs, or gallops Respiratory: clear to auscultation bilaterally, no increased work of breathing Extremities: no edema or cyanosis Skin: warm and dry, no rashes noted Neuro: alert and oriented, no focal deficits   Assessment & Plan:    1. Controlled type 2 diabetes mellitus without complication, without long-term current use of insulin (HCC) A1C today 6.9. Patient is well appearing and physically active. Did not feel well on metformin. I think it is ok to forgo medication at this point. Will follow up 3 months and recheck A1C. Patient is agreeable to this plan. - HgB A1c   Return in about 3 months (around 02/25/2018).   Lucila Maine, DO Family Medicine Resident PGY-2

## 2017-11-25 NOTE — Patient Instructions (Signed)
   It was great seeing you today!  Let's stop the metformin to see if this helps you feel better. We'll recheck A1C in 3 months. During this time try to avoid sugary foods, carbs, starches.  Otherwise everything else looks great.   If you have questions or concerns please do not hesitate to call at [phone].  Lucila Maine, DO PGY-2, [facility] 11/25/2017 1:44 PM

## 2017-11-29 ENCOUNTER — Encounter: Payer: Self-pay | Admitting: Family Medicine

## 2017-12-10 ENCOUNTER — Encounter: Payer: Self-pay | Admitting: Family Medicine

## 2017-12-27 DIAGNOSIS — H25813 Combined forms of age-related cataract, bilateral: Secondary | ICD-10-CM | POA: Diagnosis not present

## 2017-12-27 DIAGNOSIS — H17822 Peripheral opacity of cornea, left eye: Secondary | ICD-10-CM | POA: Diagnosis not present

## 2017-12-27 DIAGNOSIS — H18463 Peripheral corneal degeneration, bilateral: Secondary | ICD-10-CM | POA: Diagnosis not present

## 2018-01-20 DIAGNOSIS — H2512 Age-related nuclear cataract, left eye: Secondary | ICD-10-CM | POA: Diagnosis not present

## 2018-03-13 DIAGNOSIS — H2511 Age-related nuclear cataract, right eye: Secondary | ICD-10-CM | POA: Diagnosis not present

## 2018-03-17 DIAGNOSIS — H2511 Age-related nuclear cataract, right eye: Secondary | ICD-10-CM | POA: Diagnosis not present

## 2018-04-05 ENCOUNTER — Ambulatory Visit (HOSPITAL_COMMUNITY): Payer: Self-pay | Admitting: Dentistry

## 2018-04-05 ENCOUNTER — Encounter (HOSPITAL_COMMUNITY): Payer: Self-pay | Admitting: Dentistry

## 2018-04-05 VITALS — BP 158/82 | HR 94 | Temp 98.4°F

## 2018-04-05 DIAGNOSIS — Z923 Personal history of irradiation: Secondary | ICD-10-CM

## 2018-04-05 DIAGNOSIS — K0850 Unsatisfactory restoration of tooth, unspecified: Secondary | ICD-10-CM

## 2018-04-05 DIAGNOSIS — Z85818 Personal history of malignant neoplasm of other sites of lip, oral cavity, and pharynx: Secondary | ICD-10-CM

## 2018-04-05 DIAGNOSIS — C07 Malignant neoplasm of parotid gland: Secondary | ICD-10-CM | POA: Diagnosis not present

## 2018-04-05 NOTE — Progress Notes (Signed)
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 Patient now presents for limited oral examination and evaluation of fractured restoration on tooth #24. Patient indicates that the resin restoration was fractured off several days ago. Patient denies any symptoms of acute pulpitis. Patient is interested in having the resin restoration replaced. Patient indicates that he did find his lower partial denture.  She indicates that he does wear the partial denture until it starts irritating his lower ridge.  Medical Hx Update:  Past Medical History:  Diagnosis Date  . Arthritis    left shoulder   . BPH (benign prostatic hyperplasia)   . Cancer [facility])    left parotid; 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 Edison Nasuti; S/P Radiation therapy 10/26/02 thru 12/11/02 [id number]  . Diabetes mellitus without complication ([place]) Onset -08/03/13  . Hyperlipidemia   . Hypertension 10/19/2008  .  Past Surgical History:  Procedure Laterality Date  . HERNIA REPAIR    . PAROTID GLAND TUMOR EXCISION    . ROTATOR CUFF REPAIR    . TRANSURETHRAL RESECTION OF PROSTATE N/A 12/31/2014   Procedure: TRANSURETHRAL RESECTION OF THE PROSTATE (TURP);  Surgeon: Raynelle Bring, MD;  Location: [facility];  Service: Urology;  Laterality: N/A;    ALLERGIES/ADVERSE DRUG REACTIONS: Allergies  Allergen Reactions  . Lisinopril     Lip swelling     MEDICATIONS: Current Outpatient Medications  Medication Sig Dispense Refill  . aspirin EC 81 MG tablet Take 81 mg by mouth daily.    Marland Kitchen atorvastatin (LIPITOR) 40 MG tablet Take 1 tablet (40 mg total) by mouth daily. 90 tablet 3  . hydrochlorothiazide (HYDRODIURIL) 12.5 MG tablet Take 1 tablet (12.5 mg total) by mouth daily.  90 tablet 3  . sodium-potassium bicarbonate (ALKA-SELTZER GOLD) TBEF dissolvable tablet Take 1 tablet by mouth daily as needed.    . tadalafil (CIALIS) 20 MG tablet Take 0.5-1 tablets (10-20 mg total) by mouth every other day as needed for erectile dysfunction. (Patient not taking: Reported on 12/24/2014) 5 tablet 11   No current facility-administered medications for this visit.     C/C: I have a fractured filling.  HPI:  Patient now presents for limited oral examination and evaluation of fractured restoration on tooth #24. Patient indicates that the resin restoration was fractured off several days ago. Patient denies any symptoms of acute pulpitis. Patient is interested in having the resin restoration replaced. Patient indicates that he did find his lower partial denture.  The patient indicates that he does wear the partial denture until it starts irritating his lower ridge.  DENTAL EXAM: General:  The patient is a well-developed, well-nourished male in no acute distress Vitals: BP (!) 158/82 (BP Location: Right Arm)   Pulse 94   Temp 98.4 F (36.9 C)  Extraoral Exam: there is no palpable neck lymphadenopathy. The patient denies acute TMJ symptoms.  Intraoral  Exam: the patient has xerostomia.  There is no evidence of oral abscess formation.Soft tissue lesions. Dentition: Dentition as before. Tooth #24 has lost a distolingual resin restoration. Periodontal: Chronic periodontitis with minimal plaque accumulations and generalized gingival recession.  Caries:  Loss of dental restoration from the distolingual of tooth #24. Endodontic: The patient denies acute pulpitis  symptoms. No significant periapicl radiolucencies noted. C&B: No crown restorations. Prosthodontic: The patient has upper complete denture and lower cast partial denture. Lower cast partial denture has less than ideal retention and stability. This will need to be evaluated for adjustment-tomorrow. Occlusion:  Poor occlusal  scheme but a stable occlusion. Radiographic Interpretation: PA #24 was taken. Evidence of radiolucency consistent with loss distal lingual resin restoration. No significant periapical radiolucency noted at the apex of tooth #24.  Assessments: 1. Defective restoration on the distal lingual of #24. 2. Need for evaluation of lower cast partial denture [place] surfaces  Plan:  1.  Return to clinic tomorrow at 10 AM for dental resin restoration of #24. Will also evaluate lower partial denture for denture adjustment.      Ideally a new lower partial denture could be fabricated with referral to a prosthodontist.   Lenn Cal, DDS

## 2018-04-06 ENCOUNTER — Ambulatory Visit (HOSPITAL_COMMUNITY): Payer: Self-pay | Admitting: Dentistry

## 2018-04-06 ENCOUNTER — Encounter (HOSPITAL_COMMUNITY): Payer: Self-pay | Admitting: Dentistry

## 2018-04-06 VITALS — BP 154/74 | HR 98 | Temp 98.5°F

## 2018-04-06 DIAGNOSIS — K08539 Fractured dental restorative material, unspecified: Secondary | ICD-10-CM

## 2018-04-06 DIAGNOSIS — K0850 Unsatisfactory restoration of tooth, unspecified: Secondary | ICD-10-CM

## 2018-04-06 DIAGNOSIS — K08530 Fractured dental restorative material without loss of material: Secondary | ICD-10-CM

## 2018-04-06 NOTE — Patient Instructions (Signed)
1. Brush after meals and at bedtime. 2. Use salt water rinses as needed to aid healing from denture irritation and around his gums. 3. Call for appointment if denture irritation arises.   Dr. Enrique Sack

## 2018-04-06 NOTE — Progress Notes (Signed)
04/06/2018  Patient:            Edward Newton Date of Birth:  [birth year] MRN:                [id number]   BP (!) 154/74 (BP Location: Right Arm)   Pulse 98   Temp 98.5 F (36.9 C)   Naftali Carchi presents for dental resin restoration of tooth #24 with adjustment of lower partial denture as needed.  Procedure: 36 mg Xylocaine with epi .018 via Mental nerve block and infiltration. [id number] #24  DL/Resin     AE,DBA, Tetric Ceram EVO [place] A 4.0. Restoration countoured and polished.  PIP applied to /P.  Adjustments made as needed.  Occlusion evaluated and adjusted as needed. [place] Patient accepts results. We discussed replacement of denture tooth #8 secondary to a distal incisal chip. This happened secondary to patient dropping the denture previously. Patient refused to send denture for denture repair at this time. Patient is aware of the cost of the procedure.   RTC for exam and recall in 6 months.. Patient to call if problems arise before then.   Dr. Enrique Sack

## 2018-04-25 ENCOUNTER — Ambulatory Visit (INDEPENDENT_AMBULATORY_CARE_PROVIDER_SITE_OTHER): Payer: Medicare Other | Admitting: Family Medicine

## 2018-04-25 ENCOUNTER — Other Ambulatory Visit: Payer: Self-pay

## 2018-04-25 ENCOUNTER — Encounter: Payer: Self-pay | Admitting: Family Medicine

## 2018-04-25 VITALS — BP 130/74 | HR 92 | Temp 98.2°F | Ht 70.0 in | Wt 177.8 lb

## 2018-04-25 DIAGNOSIS — R059 Cough, unspecified: Secondary | ICD-10-CM

## 2018-04-25 DIAGNOSIS — R05 Cough: Secondary | ICD-10-CM

## 2018-04-25 DIAGNOSIS — E119 Type 2 diabetes mellitus without complications: Secondary | ICD-10-CM | POA: Diagnosis not present

## 2018-04-25 DIAGNOSIS — Z23 Encounter for immunization: Secondary | ICD-10-CM

## 2018-04-25 LAB — POCT GLYCOSYLATED HEMOGLOBIN (HGB A1C): HBA1C, POC (CONTROLLED DIABETIC RANGE): 6.9 % (ref 0.0–7.0)

## 2018-04-25 NOTE — Progress Notes (Signed)
    Subjective:    Patient ID: Edward Newton, male    DOB: Feb 25, 1941, 77 y.o.   MRN: [id number]   CC: follow up DM, cough  DM- has been off meds, we are watching A1C. He denies increased thirst, hunger, or urination.   Cough- has had cough x 1 month. He thinks he had the flu that started it. He reports he had fevers, chills, body aches. He did not go to the doctor, just treated it symptomatically and he's feeling better except for the cough. He is "ocassionally" coughing up some white mucous. He denies orthopnea, leg swelling, PND, fevers, chills, hemoptysis. He is feeling well overall. He has no SOB.   Smoking status reviewed- former smoker  Review of Systems- see HPI   Objective:  BP 130/74   Pulse 92   Temp 98.2 F (36.8 C) (Oral)   Ht 5\' 10"  (1.778 m)   Wt 177 lb 12.8 oz (80.6 kg)   SpO2 92%   BMI 25.51 kg/m  Vitals and nursing note reviewed  General: well nourished, in no acute distress HEENT: normocephalic, MMM Neck: supple, non-tender, without lymphadenopathy Cardiac: RRR, clear S1 and S2, no murmurs, rubs, or gallops Respiratory: some coarse breath sounds bilaterally without wheezing or decreased breath sounds, no increased work of breathing Extremities: no edema or cyanosis Neuro: alert and oriented, no focal deficits   Assessment & Plan:    Cough  Likely post viral, no fevers, some coarse breath sounds on exam but no focal findings. Patient is comfortable with watching and waiting which I think is appropriate. Strict return precautions reviewed. Would do chest x-ray if cough worsens or patient develops SOB.   Type 2 diabetes mellitus, controlled (HCC)  A1C 6.9 below goal of 7. Continue to monitor off medications.     Return in about 3 months (around 07/26/2018), or as needed.   Lucila Maine, DO Family Medicine Resident PGY-3

## 2018-04-25 NOTE — Patient Instructions (Signed)
  Great to see you today! A1C is 6.9. We'll keep an eye on it. Let me know if your cough worsens, if you feel short of breath, or if you have a fever.  If you have questions or concerns please do not hesitate to call at [phone].  Lucila Maine, DO PGY-3, [facility] 04/25/2018 3:25 PM

## 2018-04-26 ENCOUNTER — Encounter: Payer: Self-pay | Admitting: Family Medicine

## 2018-04-26 DIAGNOSIS — R05 Cough: Secondary | ICD-10-CM | POA: Insufficient documentation

## 2018-04-26 DIAGNOSIS — R059 Cough, unspecified: Secondary | ICD-10-CM

## 2018-04-26 HISTORY — DX: Cough, unspecified: R05.9

## 2018-04-26 NOTE — Assessment & Plan Note (Signed)
  A1C 6.9 below goal of 7. Continue to monitor off medications.

## 2018-04-26 NOTE — Assessment & Plan Note (Signed)
  Likely post viral, no fevers, some coarse breath sounds on exam but no focal findings. Patient is comfortable with watching and waiting which I think is appropriate. Strict return precautions reviewed. Would do chest x-ray if cough worsens or patient develops SOB.

## 2018-05-03 DIAGNOSIS — R972 Elevated prostate specific antigen [PSA]: Secondary | ICD-10-CM | POA: Diagnosis not present

## 2018-05-18 DIAGNOSIS — R972 Elevated prostate specific antigen [PSA]: Secondary | ICD-10-CM | POA: Diagnosis not present

## 2018-05-18 DIAGNOSIS — R3912 Poor urinary stream: Secondary | ICD-10-CM | POA: Diagnosis not present

## 2018-05-18 DIAGNOSIS — R3121 Asymptomatic microscopic hematuria: Secondary | ICD-10-CM | POA: Diagnosis not present

## 2018-05-18 DIAGNOSIS — N401 Enlarged prostate with lower urinary tract symptoms: Secondary | ICD-10-CM | POA: Diagnosis not present

## 2018-07-20 ENCOUNTER — Encounter: Payer: Self-pay | Admitting: Family Medicine

## 2018-07-20 ENCOUNTER — Ambulatory Visit (INDEPENDENT_AMBULATORY_CARE_PROVIDER_SITE_OTHER): Payer: Medicare Other | Admitting: Family Medicine

## 2018-07-20 ENCOUNTER — Other Ambulatory Visit: Payer: Self-pay

## 2018-07-20 VITALS — BP 120/62 | HR 79 | Temp 98.6°F | Ht 71.0 in | Wt 173.0 lb

## 2018-07-20 DIAGNOSIS — E119 Type 2 diabetes mellitus without complications: Secondary | ICD-10-CM

## 2018-07-20 DIAGNOSIS — E782 Mixed hyperlipidemia: Secondary | ICD-10-CM | POA: Diagnosis not present

## 2018-07-20 DIAGNOSIS — I10 Essential (primary) hypertension: Secondary | ICD-10-CM

## 2018-07-20 LAB — POCT GLYCOSYLATED HEMOGLOBIN (HGB A1C): HbA1c, POC (controlled diabetic range): 7.1 % — AB (ref 0.0–7.0)

## 2018-07-20 NOTE — Progress Notes (Signed)
    Subjective:    Patient ID: Edward Newton, male    DOB: 03-09-1941, 78 y.o.   MRN: [id number]   CC: follow up DM  HPI: doing well overall. He is here to get A1C checked. He is off medication as A1C has been <7 and he dislikes the metformin. He is doing well however reports indulging in candies/sweets over holidays and since. He denies increased thirst, urination, hunger.  He has HTN and HLD as well, reports compliance with medications and no issues/side effects from these. He is quite physically active and goes hunting often.   Smoking status reviewed- former smoker, quit 30 years ago  Review of Systems- see HPI   Objective:  BP 120/62   Pulse 79   Temp 98.6 F (37 C) (Oral)   Ht 5\' 11"  (1.803 m)   Wt 173 lb (78.5 kg)   SpO2 99%   BMI 24.13 kg/m  Vitals and nursing note reviewed  General: well nourished, in no acute distress HEENT: normocephalic, MMM Cardiac: RRR, clear S1 and S2, no murmurs, rubs, or gallops Respiratory: clear to auscultation bilaterally, no increased work of breathing Extremities: no edema or cyanosis Skin: warm and dry, no rashes noted Neuro: alert and oriented, no focal deficits  Assessment & Plan:    Type 2 diabetes mellitus, controlled (HCC)  A1C today is 7.1, slightly elevated. Patient would prefer to make some diet changes in lieu of restarting metformin which I feel is appropriate at this time. Recheck A1c 3 months  Hyperlipidemia  Check lipids, CMP today. Continue atorva 40  Hypertension  Chronic, well controlled. Continue HCTZ. Check CMP today.    Return in about 3 months (around 10/18/2018).   Lucila Maine, DO Family Medicine Resident PGY-3

## 2018-07-20 NOTE — Patient Instructions (Addendum)
  Great to see you today! We'll check yearly labs.  We'll recheck A1C in 3 months  If you have questions or concerns please do not hesitate to call at [phone].  Lucila Maine, DO PGY-3, [facility] 07/20/2018 9:33 AM

## 2018-07-20 NOTE — Addendum Note (Signed)
Addended by: Lucila Maine C on: 07/20/2018 10:53 AM   Modules accepted: Level of Service

## 2018-07-20 NOTE — Assessment & Plan Note (Signed)
  Check lipids, CMP today. Continue atorva 40

## 2018-07-20 NOTE — Assessment & Plan Note (Signed)
  A1C today is 7.1, slightly elevated. Patient would prefer to make some diet changes in lieu of restarting metformin which I feel is appropriate at this time. Recheck A1c 3 months

## 2018-07-20 NOTE — Assessment & Plan Note (Signed)
  Chronic, well-controlled - Continue HCTZ - Check CMP today 

## 2018-07-21 ENCOUNTER — Encounter: Payer: Self-pay | Admitting: Family Medicine

## 2018-07-21 LAB — COMPREHENSIVE METABOLIC PANEL
ALBUMIN: 4.3 g/dL (ref 3.7–4.7)
ALT: 15 IU/L (ref 0–44)
AST: 16 IU/L (ref 0–40)
Albumin/Globulin Ratio: 1.7 (ref 1.2–2.2)
Alkaline Phosphatase: 68 IU/L (ref 39–117)
BUN / CREAT RATIO: 11 (ref 10–24)
BUN: 14 mg/dL (ref 8–27)
Bilirubin Total: 0.6 mg/dL (ref 0.0–1.2)
CO2: 23 mmol/L (ref 20–29)
Calcium: 9.2 mg/dL (ref 8.6–10.2)
Chloride: 103 mmol/L (ref 96–106)
Creatinine, Ser: 1.27 mg/dL (ref 0.76–1.27)
GFR calc non Af Amer: 55 mL/min/{1.73_m2} — ABNORMAL LOW (ref >59)
GFR, EST AFRICAN AMERICAN: 63 mL/min/{1.73_m2} (ref >59)
GLUCOSE: 116 mg/dL — AB (ref 65–99)
Globulin, Total: 2.6 g/dL (ref 1.5–4.5)
Potassium: 4 mmol/L (ref 3.5–5.2)
Sodium: 143 mmol/L (ref 134–144)
TOTAL PROTEIN: 6.9 g/dL (ref 6.0–8.5)

## 2018-07-21 LAB — LIPID PANEL
Chol/HDL Ratio: 2.9 ratio (ref 0.0–5.0)
Cholesterol, Total: 128 mg/dL (ref 100–199)
HDL: 44 mg/dL (ref >39)
LDL Calculated: 75 mg/dL (ref 0–99)
Triglycerides: 44 mg/dL (ref 0–149)
VLDL Cholesterol Cal: 9 mg/dL (ref 5–40)

## 2018-07-21 NOTE — Progress Notes (Signed)
Letter sent to patient with lab results  Lucila Maine, DO PGY-3, [facility] 07/21/2018 10:33 AM

## 2018-07-22 ENCOUNTER — Other Ambulatory Visit: Payer: Self-pay | Admitting: Family Medicine

## 2018-07-27 ENCOUNTER — Encounter: Payer: Self-pay | Admitting: Family Medicine

## 2018-07-27 NOTE — Progress Notes (Signed)
  New letter drafted with [facility] header  Lucila Maine, DO PGY-3, [facility] 07/27/2018 1:42 PM

## 2018-10-04 ENCOUNTER — Encounter (HOSPITAL_COMMUNITY): Payer: Self-pay | Admitting: Dentistry

## 2018-11-16 ENCOUNTER — Ambulatory Visit: Payer: Medicare Other

## 2018-11-17 ENCOUNTER — Ambulatory Visit (INDEPENDENT_AMBULATORY_CARE_PROVIDER_SITE_OTHER): Payer: Medicare Other | Admitting: Family Medicine

## 2018-11-17 ENCOUNTER — Other Ambulatory Visit: Payer: Self-pay

## 2018-11-17 VITALS — BP 124/66 | HR 76 | Ht 71.0 in | Wt 174.1 lb

## 2018-11-17 DIAGNOSIS — M62838 Other muscle spasm: Secondary | ICD-10-CM

## 2018-11-17 MED ORDER — BACLOFEN 10 MG PO TABS: 10.0000 mg | ORAL_TABLET | Freq: Three times a day (TID) | ORAL | 0 refills | Status: DC

## 2018-11-17 MED ORDER — MELOXICAM 15 MG PO TABS: 15.0000 mg | ORAL_TABLET | Freq: Every day | ORAL | 0 refills | Status: DC

## 2018-11-17 NOTE — Progress Notes (Signed)
HPI 78 year old male who presents for lower back pain.  Patient stated that his back pain all started back on 11/14/2018 after he been working in his garden all day.  States he spent a lot of time "bent over" and he feels like he may have also twisted his back at some point.  He states that on 6/2 and 6/3 he became a lot more stiff and he was having some trouble standing up and getting out of bed.  He has been trying Tylenol, ibuprofen 1 time, massage for his wife, and topical liniments which do not help the pain very much.  States ibuprofen did allow him to stand up and perform ADLs after taking it.  Upon review patient chart patient does have lumbar spine x-ray from 06/1999.  The actual images not available in the system but the read did show very minimal degenerative changes in the L5-S1 disc area.  Patient denies any alarm symptoms.  Specifically he denies any fecal urinary incontinence, radiating pain down either leg.  CC: Lower back pain   ROS:   Review of Systems See HPI for ROS.   CC, SH/smoking status, and VS noted  Objective: BP 124/66   Pulse 76   Ht 5\' 11"  (1.803 m)   Wt 174 lb 2 oz (79 kg)   SpO2 96%   BMI 24.29 kg/m  Gen: Very pleasant 78 year old African-American male, no acute distress, some comfortably CV: Regular rate and rhythm, no M/R/G.  Skin warm and dry Resp: Lungs clear to auscultation bilaterally, no wheezing, no accessory muscle use Ext: No edema, warm Neuro: Alert and oriented, Speech clear, No gross deficits Back: Patient with tenderness to palpation approximately 1 cm to the right of the lumbar spine.  Patient with limitation of flexion of lumbar spine, able to get to approximately 45 degrees with some effort.  No problem with flexion.  Rotation of torso did elicit some pain this area.  Sensation intact throughout lumbar spine.  No gait abnormality noted.   Assessment and plan:  Muscle spasm Patient with likely muscle spasm and possible sprain of  erector spinae muscle or perhaps iliocostalis lumborum.  Patient can continue taking Tylenol.  Did discuss alternating heat and ice.  Can try topical liniments as well.  Will give baclofen 10 mg 3 times daily given that this is the best tolerated antispasmodic in the elderly.  Given that NSAID did help patient will give him 1 week supply of Mobic 15 mg.  Reviewed CMP from 07/2018, patient GFR greater than 60. Medication will likely be very well tolerated for short duration.  Discussed alarm symptoms and when to return with patient. -Continue taking Tylenol 325 every 4 hours -Mobic 15 mg daily for 7 days -Baclofen 10 mg 3 times daily for 7 days -Alternate heat and ice -Gave lumbar spine stretches and exercises patient can do as tolerated -Can try topical liniments if helpful.   No orders of the defined types were placed in this encounter.   Meds ordered this encounter  Medications  . baclofen (LIORESAL) 10 MG tablet    Sig: Take 1 tablet (10 mg total) by mouth 3 (three) times daily.    Dispense:  21 each    Refill:  0  . meloxicam (MOBIC) 15 MG tablet    Sig: Take 1 tablet (15 mg total) by mouth daily.    Dispense:  7 tablet    Refill:  0     Guadalupe Dawn MD PGY-2 Family Medicine  Resident  11/17/2018 9:15 AM

## 2018-11-17 NOTE — Patient Instructions (Signed)
It was great seeing you today!  I am sorry about your back pain, I think it is a muscle spasm that you may have gotten during the yard work.  The best treatment for muscle spasms is rest, heat/ice, anti-inflammatory medications, muscle relaxer, and rehab stretches.  Gave you a handout with rehab stretches.  I gave you a one-week prescription of a muscle relaxer and a strong anti-inflammatory medication.  In regards to heat/ice you can alternate between the 2 as best you can tolerate.

## 2018-11-17 NOTE — Assessment & Plan Note (Signed)
Patient with likely muscle spasm and possible sprain of erector spinae muscle or perhaps iliocostalis lumborum.  Patient can continue taking Tylenol.  Did discuss alternating heat and ice.  Can try topical liniments as well.  Will give baclofen 10 mg 3 times daily given that this is the best tolerated antispasmodic in the elderly.  Given that NSAID did help patient will give him 1 week supply of Mobic 15 mg.  Reviewed CMP from 07/2018, patient GFR greater than 60. Medication will likely be very well tolerated for short duration.  Discussed alarm symptoms and when to return with patient. -Continue taking Tylenol 325 every 4 hours -Mobic 15 mg daily for 7 days -Baclofen 10 mg 3 times daily for 7 days -Alternate heat and ice -Gave lumbar spine stretches and exercises patient can do as tolerated -Can try topical liniments if helpful.

## 2018-12-02 DIAGNOSIS — R3912 Poor urinary stream: Secondary | ICD-10-CM | POA: Diagnosis not present

## 2018-12-02 DIAGNOSIS — R3121 Asymptomatic microscopic hematuria: Secondary | ICD-10-CM | POA: Diagnosis not present

## 2018-12-03 ENCOUNTER — Other Ambulatory Visit: Payer: Self-pay | Admitting: Family Medicine

## 2019-04-20 ENCOUNTER — Other Ambulatory Visit: Payer: Self-pay | Admitting: *Deleted

## 2019-04-20 MED ORDER — HYDROCHLOROTHIAZIDE 12.5 MG PO TABS: 12.5000 mg | ORAL_TABLET | Freq: Every day | ORAL | 2 refills | Status: DC

## 2019-06-22 ENCOUNTER — Ambulatory Visit (INDEPENDENT_AMBULATORY_CARE_PROVIDER_SITE_OTHER): Payer: Medicare Other | Admitting: Family Medicine

## 2019-06-22 ENCOUNTER — Other Ambulatory Visit: Payer: Self-pay

## 2019-06-22 VITALS — BP 132/84 | HR 82 | Wt 171.6 lb

## 2019-06-22 DIAGNOSIS — E119 Type 2 diabetes mellitus without complications: Secondary | ICD-10-CM

## 2019-06-22 DIAGNOSIS — I10 Essential (primary) hypertension: Secondary | ICD-10-CM | POA: Diagnosis not present

## 2019-06-22 DIAGNOSIS — Z0000 Encounter for general adult medical examination without abnormal findings: Secondary | ICD-10-CM | POA: Diagnosis not present

## 2019-06-22 DIAGNOSIS — E782 Mixed hyperlipidemia: Secondary | ICD-10-CM

## 2019-06-22 LAB — POCT UA - MICROALBUMIN
Albumin/Creatinine Ratio, Urine, POC: <30
Creatinine, POC: 50 mg/dL
Microalbumin Ur, POC: 10 mg/L

## 2019-06-22 LAB — POCT GLYCOSYLATED HEMOGLOBIN (HGB A1C): HbA1c, POC (controlled diabetic range): 6.7 % (ref 0.0–7.0)

## 2019-06-22 NOTE — Progress Notes (Signed)
    Subjective:  Edward Newton is a 79 y.o. male who presents to the [facility] today with a chief complaint of annual exam.     .   HPI:  Edward Newton here for his annual physical.  Has no concerns or complaints today.   Denies increased urinary frequent, blood in stool or urine. Had a recent fall in the woods while hunting. States he tripped over a vine. Denies LOC and hitting head.   Diabetes Mellitus  Eats 2-3 meals a day and 1-2 snacks. Does not to take any diabetic medication.  Denies increased thirst, hunger, or frequent urination. Does not check blood sugar regularly. Has eye exam scheduled next week.   HLD and HTN Denies CP, SOB, LE edema and headaches. Taking medications as prescribed.     [place], medications and smoking status reviewed.   ROS: See HPI     Objective:  Physical Exam: BP 132/84   Pulse 82   Wt 171 lb 9.6 oz (77.8 kg)   SpO2 94%   BMI 23.93 kg/m    GEN: pleasant elderly male in no acute distress CV: regular rate and rhythm, no murmurs appreciated  RESP: no increased work of breathing, clear to ascultation bilaterally with no crackles, wheezes, or rhonchi  ABD: Bowel sounds present. Soft, Nontender, Nondistended.  MSK: no extremity edema, right hand DIP abnormalities SKIN: warm, dry  NEURO: Gross sensation intact, strength 5 out of 5 bilateral upper and lower extremities, moves all extremities appropriately PSYCH: Normal affect and thought content    Results for orders placed or performed in visit on 06/22/19 (from the past 72 hour(s))  HgB A1c     Status: None   Collection Time: 06/22/19  1:49 PM  Result Value Ref Range   Hemoglobin A1C     HbA1c POC (<> result, manual entry)     HbA1c, POC (prediabetic range)     HbA1c, POC (controlled diabetic range) 6.7 0.0 - 7.0 %     Assessment/Plan:  Hypertension Taking HCTZ 12.5 mg  BP today 132/84 at goal. Considered changing to Losartan however patient had angioedema with Lisinopril.  BMP  today  Type 2 diabetes mellitus, controlled ([place]) Home regimen: no medications at this time.  Well controlled with diet.  Encouraged continued diet rich in vegetables and complex carbs.  Continue exercising.  a1c 6.7 today from 7.1 previously Foot exam: Completed today and was normal. Urine microalbumine: Obtained today Eye exam: Per patient, scheduled next week    Hyperlipidemia  Lipid panel 07/2018: Cholesterol 128, TGs 44, HDL 44, LDL 75. Continue Atorvastatin 40 mg.  Repeat lipid panel after Feb 2021.    Healthcare maintenance Follows with [facility]. Will defer prostate screening to urology.  Colonoscopy not due until 2023.  Pneumococcal, influenza and tetanus vaccinations up-to-date.  Discuss Shingles vaccine at next visit.          Orders Placed This Encounter  Procedures  . Basic Metabolic Panel  . POCT UA - Microalbumin  . HgB A1c    No orders of the defined types were placed in this encounter.     Lyndee Hensen, DO PGY-1, [facility] 06/22/2019 1:56 PM

## 2019-06-22 NOTE — Patient Instructions (Signed)
It was great seeing you today!   I'd like to see you back in 3 months but if you need to be seen earlier than that for any new issues we're happy to fit you in, just give [place] a call!  - Let me know if you need any refills of your prescriptions     If you have questions or concerns please do not hesitate to call at [phone].  Dr. [facility]

## 2019-06-23 LAB — BASIC METABOLIC PANEL
BUN/Creatinine Ratio: 14 (ref 10–24)
BUN: 19 mg/dL (ref 8–27)
CO2: 23 mmol/L (ref 20–29)
Calcium: 9.2 mg/dL (ref 8.6–10.2)
Chloride: 105 mmol/L (ref 96–106)
Creatinine, Ser: 1.35 mg/dL — ABNORMAL HIGH (ref 0.76–1.27)
GFR calc Af Amer: 59 mL/min/{1.73_m2} — ABNORMAL LOW (ref >59)
GFR calc non Af Amer: 51 mL/min/{1.73_m2} — ABNORMAL LOW (ref >59)
Glucose: 83 mg/dL (ref 65–99)
Potassium: 4.2 mmol/L (ref 3.5–5.2)
Sodium: 141 mmol/L (ref 134–144)

## 2019-06-25 DIAGNOSIS — Z0000 Encounter for general adult medical examination without abnormal findings: Secondary | ICD-10-CM | POA: Insufficient documentation

## 2019-06-25 NOTE — Assessment & Plan Note (Signed)
Lipid panel 07/2018: Cholesterol 128, TGs 44, HDL 44, LDL 75. Continue Atorvastatin 40 mg.  Repeat lipid panel after Feb 2021.

## 2019-06-25 NOTE — Assessment & Plan Note (Signed)
Follows with [facility]. Will defer prostate screening to urology.  Colonoscopy not due until 2023.  Pneumococcal, influenza and tetanus vaccinations up-to-date.  Discuss Shingles vaccine at next visit.

## 2019-06-25 NOTE — Assessment & Plan Note (Signed)
Home regimen: no medications at this time.  Well controlled with diet.  Encouraged continued diet rich in vegetables and complex carbs.  Continue exercising.  a1c 6.7 today from 7.1 previously Foot exam: Completed today and was normal. Urine microalbumine: Obtained today Eye exam: Per patient, scheduled next week

## 2019-06-25 NOTE — Assessment & Plan Note (Signed)
Taking HCTZ 12.5 mg  BP today 132/84 at goal. Considered changing to Losartan however patient had angioedema with Lisinopril.  BMP today

## 2019-08-08 DIAGNOSIS — H16223 Keratoconjunctivitis sicca, not specified as Sjogren's, bilateral: Secondary | ICD-10-CM | POA: Diagnosis not present

## 2019-08-08 DIAGNOSIS — H35433 Paving stone degeneration of retina, bilateral: Secondary | ICD-10-CM | POA: Diagnosis not present

## 2019-08-21 ENCOUNTER — Other Ambulatory Visit: Payer: Self-pay

## 2019-08-21 ENCOUNTER — Ambulatory Visit (INDEPENDENT_AMBULATORY_CARE_PROVIDER_SITE_OTHER): Payer: Medicare Other | Admitting: Family Medicine

## 2019-08-21 VITALS — BP 135/75 | HR 75 | Wt 173.6 lb

## 2019-08-21 DIAGNOSIS — L03115 Cellulitis of right lower limb: Secondary | ICD-10-CM | POA: Diagnosis not present

## 2019-08-21 MED ORDER — DICLOXACILLIN SODIUM 500 MG PO CAPS: 500.0000 mg | ORAL_CAPSULE | Freq: Four times a day (QID) | ORAL | 0 refills | Status: AC

## 2019-08-21 NOTE — Patient Instructions (Signed)
It was great seeing you today!   I'd like to see you back if 4 days (about Friday) but if you need to be seen earlier than that for any new issues we're happy to fit you in, just give [place] a call!  - if redness or swelling is worsening or not improving with treatment please call the office or go to the emergency room  - if you have fever, vomiting, changes in your appetite please go to the emergency department or call the office  - stop by the pharmacy to pick up your antibiotics    If you have questions or concerns please do not hesitate to call at [phone].  Dr. [facility]

## 2019-08-21 NOTE — Progress Notes (Signed)
    SUBJECTIVE:   CHIEF COMPLAINT / HPI:    Leg Rash  Pt went hunting two weeks ago and stepped in a puddle of water that went over his boots.  Saw a red rash a couple of days after and that was itching.  "I rubbed it."  About a week ago rash spread up his right leg. Pain, swelling and worsening redness started last week.  Denies fever, chest pain, nausea, vomiting, changes in eating and sleeping habits. Pain worse with walking especially if he has on boots.  Denies getting bit by anything that he is aware of.  Denies similar sx in past.   PERTINENT  PMH / PSH: DM, arthritis   OBJECTIVE:   BP 135/75   Pulse 75   Wt 78.7 kg   SpO2 95%   BMI 24.21 kg/m     GEN: pleasant elderly male, in no acute distress CV: distal  pulses palpable RESP: no increased work of breathing MSK: RLE: erythema, swelling 2+, limited ROM due to pain, LLE: normal ROM, no swelling or erythema SKIN: see images below          ASSESSMENT/PLAN:   Cellulitis of leg, right Rx dicloxacillin sent to pharmacy; 500 mg TID x 10 days.  - Follow up in 3-4 days for monitoring of regression of redness (see images above) and swelling  - Strict precautions provided if symptoms acutely worsen    Lyndee Hensen, [place]

## 2019-08-23 DIAGNOSIS — M7989 Other specified soft tissue disorders: Secondary | ICD-10-CM | POA: Insufficient documentation

## 2019-08-23 DIAGNOSIS — M79661 Pain in right lower leg: Secondary | ICD-10-CM

## 2019-08-23 HISTORY — DX: Pain in right lower leg: M79.661

## 2019-08-23 HISTORY — DX: Other specified soft tissue disorders: M79.89

## 2019-08-23 NOTE — Assessment & Plan Note (Signed)
Rx dicloxacillin sent to pharmacy; 500 mg TID x 10 days.  - Follow up in 3-4 days for monitoring of regression of redness (see images above) and swelling  - Strict precautions provided if symptoms acutely worsen

## 2019-08-24 ENCOUNTER — Ambulatory Visit (INDEPENDENT_AMBULATORY_CARE_PROVIDER_SITE_OTHER): Payer: Medicare Other | Admitting: Family Medicine

## 2019-08-24 ENCOUNTER — Encounter: Payer: Self-pay | Admitting: Family Medicine

## 2019-08-24 ENCOUNTER — Other Ambulatory Visit: Payer: Self-pay

## 2019-08-24 VITALS — BP 140/60 | HR 83 | Wt 175.0 lb

## 2019-08-24 DIAGNOSIS — L03115 Cellulitis of right lower limb: Secondary | ICD-10-CM | POA: Diagnosis not present

## 2019-08-24 MED ORDER — DOXYCYCLINE HYCLATE 100 MG PO TABS: 100.0000 mg | ORAL_TABLET | Freq: Two times a day (BID) | ORAL | 0 refills | Status: AC

## 2019-08-24 NOTE — Assessment & Plan Note (Signed)
No improvement since initiation of antibiotics although has only been about 2.5 days since initiation.  Does have progression of swelling and erythema to dorsum of right foot today although given this is dependent, may likely gravity related.  Could continue current regimen and be seen at days 4-5 however given we are approaching a weekend and patient has diabetes, will add additional MRSA coverage even though no purulence/pustules and follow up on Monday. Emergency precautions reviewed, see AVS for details.

## 2019-08-24 NOTE — Patient Instructions (Signed)
It was great to see you!  Our plans for today:  - We are adding an additional antibiotic to your regimen. - Come back to clinic on Monday for follow up. - If at any time you develop nausea, vomiting, fever, or if your rash gets worse, come back to be seen or go to the Emergency room if after hours.  Take care and seek immediate care sooner if you develop any concerns.   Dr. [facility]

## 2019-08-24 NOTE — Progress Notes (Signed)
    SUBJECTIVE:   CHIEF COMPLAINT: Cellulitis f/u  HPI:   Seen previously 3/8 for 1 week h/o leg rash, swelling, pain after exposure to water and local trauma. Prescribed 10 day course of dicloxacillin. Has been taking antibiotic as prescribed without missed doses, started 3/8 night. Reports pain and rash is about the same but notes the swelling seems worse on the top part of his foot. Denies fevers, nausea, vomiting.   PERTINENT  PMH / PSH: HTN, T2DM, OA, BPH, HLD   OBJECTIVE:   BP 140/60   Pulse 83   Wt 175 lb (79.4 kg)   SpO2 97%   BMI 24.41 kg/m   Gen: Elderly, well-appearing, in NAD Skin: Swelling and erythema noted to anterior portion of shin with extension to dorsum of right foot, progressed as compared to 3/8 picture.  See media tab for updated picture.  ASSESSMENT/PLAN:   Cellulitis of leg, right No improvement since initiation of antibiotics although has only been about 2.5 days since initiation.  Does have progression of swelling and erythema to dorsum of right foot today although given this is dependent, may likely gravity related.  Could continue current regimen and be seen at days 4-5 however given we are approaching a weekend and patient has diabetes, will add additional MRSA coverage even though no purulence/pustules and follow up on Monday. Emergency precautions reviewed, see AVS for details.    Rory Percy, [place]

## 2019-08-28 ENCOUNTER — Encounter: Payer: Self-pay | Admitting: Family Medicine

## 2019-08-28 ENCOUNTER — Other Ambulatory Visit: Payer: Self-pay

## 2019-08-28 ENCOUNTER — Ambulatory Visit (INDEPENDENT_AMBULATORY_CARE_PROVIDER_SITE_OTHER): Payer: Medicare Other | Admitting: Family Medicine

## 2019-08-28 DIAGNOSIS — L03115 Cellulitis of right lower limb: Secondary | ICD-10-CM | POA: Diagnosis not present

## 2019-08-28 NOTE — Assessment & Plan Note (Signed)
Patient to continue with current course of doxycycline.  Redness improved since appointment on 3/11.  Patient still with swelling and erythema present.  Counseled on using compression stockings and completing antibiotic.  Will follow up in 1 week with me.  Emergency precautions reviewed, patient voiced understanding.

## 2019-08-28 NOTE — Progress Notes (Signed)
   SUBJECTIVE:   CHIEF COMPLAINT / HPI: cellulitis f/u  Right leg cellulitis: Previously seen on 3/11.  Patient reports that in the end of February he developed leg rash, swelling and pain after local trauma and exposure to water.  He was given 10-day course of dicloxacillin.  Patient has been taking antibiotic as prescribed and at follow-up visit on 3/11 patient was started on doxycycline for MRSA coverage.  Patient reports that his pain, rash and swelling have all improved over the weekend.  He denies any fevers, chills, nausea, vomiting.  PERTINENT  PMH / PSH: HTN, T2DM, OA, BPH, HLD  OBJECTIVE:  BP 132/64   Pulse 81   Ht 5\' 10"  (1.778 m)   Wt 173 lb 2 oz (78.5 kg)   SpO2 97%   BMI 24.84 kg/m   General: NAD, pleasant Skin: Swelling and erythema noted to anterior portion of shin with extension to dorsum of right foot, improved per patient  ASSESSMENT/PLAN:   Cellulitis of leg, right Patient to continue with current course of doxycycline.  Redness improved since appointment on 3/11.  Patient still with swelling and erythema present.  Counseled on using compression stockings and completing antibiotic.  Will follow up in 1 week with me.  Emergency precautions reviewed, patient voiced understanding.   [place] Lilliam Chamblee, DO PGY-3, [facility]

## 2019-08-28 NOTE — Patient Instructions (Signed)
Thank you for coming to see me today. It was a pleasure! Today we talked about:   Please complete the antibiotic that you are currently on.  Come back to clinic on Monday for follow up.  If at any time you develop nausea, vomiting, fever, or if your rash gets worse, come back to be seen or go to the Emergency room if after hours.  If you have any questions or concerns, please do not hesitate to call the office at [phone].  Take Care,   [place] Kylie Gros, DO

## 2019-09-04 ENCOUNTER — Other Ambulatory Visit: Payer: Self-pay

## 2019-09-04 ENCOUNTER — Ambulatory Visit (INDEPENDENT_AMBULATORY_CARE_PROVIDER_SITE_OTHER): Payer: Medicare Other | Admitting: Family Medicine

## 2019-09-04 VITALS — BP 130/40 | HR 83 | Ht 70.0 in | Wt 175.1 lb

## 2019-09-04 DIAGNOSIS — M7989 Other specified soft tissue disorders: Secondary | ICD-10-CM | POA: Diagnosis not present

## 2019-09-04 DIAGNOSIS — M79661 Pain in right lower leg: Secondary | ICD-10-CM | POA: Diagnosis not present

## 2019-09-04 MED ORDER — TRIAMCINOLONE ACETONIDE 0.1 % EX OINT: 1.0000 "application " | TOPICAL_OINTMENT | Freq: Two times a day (BID) | CUTANEOUS | 1 refills | Status: DC

## 2019-09-04 NOTE — Patient Instructions (Addendum)
Thank you for coming to see me today. It was a pleasure! Today we talked about:   It is very important that you wear your compression stockings at home as often as you are able.  While you are standing at home you should also try to keep your leg elevated above your heart as much as you are able. I have sent you a cream to the pharmacy that you can use twice daily on your leg in order to help with itching.    We are going to obtain an ultrasound of your right leg to be sure that we are not missing something like a blood clot.  We do not think that you need to have any more antibiotics at this time.  Please not hesitate to return to the office sooner if you experience any fevers, worsening swelling, redness of your right leg, chest pain or shortness of breath.  We will call you after the Ultrasound on Wednesday at 1:00pm and schedule a follow up, or return sooner as needed.  If you have any questions or concerns, please do not hesitate to call the office at [phone].  Take Care,   [place] Korayma Hagwood, DO

## 2019-09-04 NOTE — Assessment & Plan Note (Addendum)
Patient with erythema improved. Does not appear to be cellulitis at this time and patient has completed abx course with doxy. Given continued pain, swelling, will rule out DVT with [place]. Swelling could be from venous stasis related to healing cellulitis, and patient counseled to use compression stockings and elevate leg more consistently. For the concurrent itching of his leg, given steroid cream. Will call and schedule follow up based on results from DVT US of RLE that is scheduled for 3/24.

## 2019-09-04 NOTE — Progress Notes (Addendum)
   SUBJECTIVE:   CHIEF COMPLAINT / HPI:   Right leg swelling, pain: Previously seen on 3/15.  Patient reports that in the end of February he developed leg rash, swelling and pain after local trauma and exposure to water.  He was given 10-day course of dicloxacillin. Patient has been taking antibiotic as prescribed and at follow-up visit on 3/11 patient was started on doxycycline for MRSA coverage. Patient reports that his rash has improved, but he is still having swelling and pain. He reports wearing his compression stocking during the day. He reports it is also itching. He denies any fevers, chills, nausea, vomiting.  PERTINENT  PMH / PSH: HTN, T2DM, OA, BPH, HLD  OBJECTIVE:  BP (!) 130/40 Comment: provider informed  Pulse 83   Ht 5\' 10"  (1.778 m)   Wt 175 lb 2 oz (79.4 kg)   SpO2 99%   BMI 25.13 kg/m   General: NAD, pleasant Skin:Swelling and heat noted to anterior portion of shin with extension to dorsum of right foot, tender diffusely on shin, calf and foot.   ASSESSMENT/PLAN:   Pain and swelling of right lower leg Patient with erythema improved. Does not appear to be cellulitis at this time and patient has completed abx course with doxy. Given continued pain, swelling, will rule out DVT with [place]. Swelling could be from venous stasis related to healing cellulitis, and patient counseled to use compression stockings and elevate leg more consistently. For the concurrent itching of his leg, given steroid cream. Will call and schedule follow up based on results from DVT US of RLE that is scheduled for 3/24.    [place] Talina Pleitez, DO PGY-3, [facility]

## 2019-09-07 ENCOUNTER — Encounter: Payer: Self-pay | Admitting: Family Medicine

## 2019-09-07 ENCOUNTER — Ambulatory Visit (HOSPITAL_COMMUNITY)
Admission: RE | Admit: 2019-09-07 | Discharge: 2019-09-07 | Disposition: A | Discharge status: Home / Self Care | Payer: Medicare Other | Source: Ambulatory Visit | Attending: Family Medicine | Admitting: Family Medicine

## 2019-09-07 ENCOUNTER — Other Ambulatory Visit: Payer: Self-pay

## 2019-09-07 DIAGNOSIS — M7989 Other specified soft tissue disorders: Secondary | ICD-10-CM | POA: Diagnosis not present

## 2019-09-07 DIAGNOSIS — M79661 Pain in right lower leg: Secondary | ICD-10-CM | POA: Insufficient documentation

## 2019-09-07 NOTE — Progress Notes (Signed)
Right lower extremity venous duplex exam completed.  Preliminary results can be found under CV proc under chart review.  09/07/2019 2:40 PM  [name], K., RDMS, RVT

## 2019-11-24 DIAGNOSIS — Z8249 Family history of ischemic heart disease and other diseases of the circulatory system: Secondary | ICD-10-CM | POA: Diagnosis not present

## 2019-11-24 DIAGNOSIS — Z888 Allergy status to other drugs, medicaments and biological substances status: Secondary | ICD-10-CM | POA: Diagnosis not present

## 2019-11-24 DIAGNOSIS — Z7982 Long term (current) use of aspirin: Secondary | ICD-10-CM | POA: Diagnosis not present

## 2019-11-24 DIAGNOSIS — Z823 Family history of stroke: Secondary | ICD-10-CM | POA: Diagnosis not present

## 2019-11-24 DIAGNOSIS — Z87891 Personal history of nicotine dependence: Secondary | ICD-10-CM | POA: Diagnosis not present

## 2019-11-24 DIAGNOSIS — Z89021 Acquired absence of right finger(s): Secondary | ICD-10-CM | POA: Diagnosis not present

## 2019-11-24 DIAGNOSIS — E785 Hyperlipidemia, unspecified: Secondary | ICD-10-CM | POA: Diagnosis not present

## 2019-11-24 DIAGNOSIS — Z85828 Personal history of other malignant neoplasm of skin: Secondary | ICD-10-CM | POA: Diagnosis not present

## 2019-11-24 DIAGNOSIS — R972 Elevated prostate specific antigen [PSA]: Secondary | ICD-10-CM | POA: Diagnosis not present

## 2019-11-24 DIAGNOSIS — I10 Essential (primary) hypertension: Secondary | ICD-10-CM | POA: Diagnosis not present

## 2019-11-27 DIAGNOSIS — R972 Elevated prostate specific antigen [PSA]: Secondary | ICD-10-CM | POA: Diagnosis not present

## 2019-11-27 DIAGNOSIS — N401 Enlarged prostate with lower urinary tract symptoms: Secondary | ICD-10-CM | POA: Diagnosis not present

## 2019-11-27 DIAGNOSIS — R351 Nocturia: Secondary | ICD-10-CM | POA: Diagnosis not present

## 2020-01-09 ENCOUNTER — Other Ambulatory Visit: Payer: Self-pay | Admitting: Family Medicine

## 2020-02-02 ENCOUNTER — Other Ambulatory Visit: Payer: Self-pay

## 2020-02-02 ENCOUNTER — Ambulatory Visit (HOSPITAL_COMMUNITY)
Admission: RE | Admit: 2020-02-02 | Discharge: 2020-02-02 | Disposition: A | Discharge status: Home / Self Care | Payer: Medicare HMO | Source: Ambulatory Visit | Attending: Family Medicine | Admitting: Family Medicine

## 2020-02-02 ENCOUNTER — Encounter: Payer: Self-pay | Admitting: Family Medicine

## 2020-02-02 ENCOUNTER — Ambulatory Visit (INDEPENDENT_AMBULATORY_CARE_PROVIDER_SITE_OTHER): Payer: Medicare HMO | Admitting: Family Medicine

## 2020-02-02 ENCOUNTER — Other Ambulatory Visit: Payer: Self-pay | Admitting: Sleep Medicine

## 2020-02-02 VITALS — BP 120/64 | HR 78 | Ht 70.0 in | Wt 165.0 lb

## 2020-02-02 DIAGNOSIS — Z20822 Contact with and (suspected) exposure to covid-19: Secondary | ICD-10-CM

## 2020-02-02 DIAGNOSIS — T671XXA Heat syncope, initial encounter: Secondary | ICD-10-CM

## 2020-02-02 DIAGNOSIS — R55 Syncope and collapse: Secondary | ICD-10-CM

## 2020-02-02 DIAGNOSIS — I10 Essential (primary) hypertension: Secondary | ICD-10-CM | POA: Diagnosis not present

## 2020-02-02 DIAGNOSIS — E119 Type 2 diabetes mellitus without complications: Secondary | ICD-10-CM | POA: Diagnosis not present

## 2020-02-02 DIAGNOSIS — R69 Illness, unspecified: Secondary | ICD-10-CM | POA: Diagnosis not present

## 2020-02-02 DIAGNOSIS — Z1159 Encounter for screening for other viral diseases: Secondary | ICD-10-CM | POA: Diagnosis not present

## 2020-02-02 DIAGNOSIS — Z0000 Encounter for general adult medical examination without abnormal findings: Secondary | ICD-10-CM | POA: Diagnosis not present

## 2020-02-02 DIAGNOSIS — B349 Viral infection, unspecified: Secondary | ICD-10-CM | POA: Diagnosis not present

## 2020-02-02 LAB — POCT GLYCOSYLATED HEMOGLOBIN (HGB A1C): HbA1c, POC (controlled diabetic range): 7 % (ref 0.0–7.0)

## 2020-02-02 NOTE — Progress Notes (Addendum)
   SUBJECTIVE:   CHIEF COMPLAINT / HPI:   Chief Complaint  Patient presents with  . Diabetes     Edward Newton is a 79 y.o. male here for syncope.    Patient states that he fainted last week while working on a friend's deck outside in the heat.  Reports he tried to walk to his car and then fell down and his friend had to help him get up.  Believe he was only out for a few seconds but heard his friends talking the whole time.  Says he was dripping sweat and felt weak.  Reports later that week had cough, congestion, myalgias.  Denies fever, shortness of breath, chest pain, sore throat, loss of taste and smell.  Says that he felt like he had the flu.  Reports no known sick contacts.  Missed church he did not want to expose anyone to his symptoms.  Reports that he feels fine now despite occasional cough.     PERTINENT  PMH / PSH: reviewed and updated as appropriate   OBJECTIVE:   BP 120/64   Pulse 78   Ht 5\' 10"  (1.778 m)   Wt 165 lb (74.8 kg)   SpO2 99%   BMI 23.68 kg/m    GEN: pleasant well-appearing elderly male, in no acute distress  CV: regular rate and rhythm, no murmurs appreciated  RESP: no increased work of breathing, clear to ascultation bilaterally with no crackles, wheezes, or rhonchi  SKIN: warm, dry, normal skin turgor NEURO: Alert and oriented, moves all extremities appropriately     ASSESSMENT/PLAN:   Syncope Differential diagnosis includes heatstroke, Covid, dehydration, vasovagal syncope, valvular disease, anemia, electrolyte abnormalities.  -Obtain a EKG -CMP, CBC, lipid panel, A1c; risk stratification labs -Obtain Covid testing -Consider echo pending work-up   EKG: HR 72, NSR, normal axis. RBBB; Personally reviewed by me.   Obtain hepatitis C screen.    Lyndee Hensen, DO PGY-2, [facility] 02/02/2020

## 2020-02-02 NOTE — Patient Instructions (Addendum)
It was great seeing you today and glad you are feeling better! Please check-out at the front desk before leaving the clinic.   Visit Remembers: - Continue to work on your healthy eating habits and incorporating exercise into your daily life.  - I will call you to discuss your lab work.  - Please go get tested to know if you had COVID or not  [facility] COVID-19 Testing When: Fri Feb 02, 2020 11 AM  Where: [address], [place], [place] [address], [place] (map)  Regarding lab work today:  Due to recent changes in healthcare laws, you may see the results of your imaging and laboratory studies on MyChart before your provider has had a chance to review them.  I understand that in some cases there may be results that are confusing or concerning to you. Not all laboratory results come back in the same time frame and you may be waiting for multiple results in order to interpret others.  Please give [place] 72 hours in order for your provider to thoroughly review all the results before contacting the office for clarification of your results. If everything is normal, you will get a letter in the mail or a message in My Chart. Please give [place] a call if you do not hear from [place] after 2 weeks.  Please bring all of your medications with you to each visit.    If you haven't already, sign up for My Chart to have easy access to your labs results, and communication with your primary care physician.  Feel free to call with any questions or concerns at any time, at [phone].   Take care,  Dr. Rushie [facility]

## 2020-02-03 LAB — LIPID PANEL
Chol/HDL Ratio: 3.8 ratio (ref 0.0–5.0)
Cholesterol, Total: 125 mg/dL (ref 100–199)
HDL: 33 mg/dL — ABNORMAL LOW (ref >39)
LDL Chol Calc (NIH): 78 mg/dL (ref 0–99)
Triglycerides: 67 mg/dL (ref 0–149)
VLDL Cholesterol Cal: 14 mg/dL (ref 5–40)

## 2020-02-03 LAB — COMPREHENSIVE METABOLIC PANEL
ALT: 58 IU/L — ABNORMAL HIGH (ref 0–44)
AST: 56 IU/L — ABNORMAL HIGH (ref 0–40)
Albumin/Globulin Ratio: 1.5 (ref 1.2–2.2)
Albumin: 4.1 g/dL (ref 3.7–4.7)
Alkaline Phosphatase: 89 IU/L (ref 48–121)
BUN/Creatinine Ratio: 11 (ref 10–24)
BUN: 15 mg/dL (ref 8–27)
Bilirubin Total: 0.7 mg/dL (ref 0.0–1.2)
CO2: 29 mmol/L (ref 20–29)
Calcium: 9.4 mg/dL (ref 8.6–10.2)
Chloride: 100 mmol/L (ref 96–106)
Creatinine, Ser: 1.36 mg/dL — ABNORMAL HIGH (ref 0.76–1.27)
GFR calc Af Amer: 58 mL/min/{1.73_m2} — ABNORMAL LOW (ref >59)
GFR calc non Af Amer: 50 mL/min/{1.73_m2} — ABNORMAL LOW (ref >59)
Globulin, Total: 2.7 g/dL (ref 1.5–4.5)
Glucose: 128 mg/dL — ABNORMAL HIGH (ref 65–99)
Potassium: 4.1 mmol/L (ref 3.5–5.2)
Sodium: 139 mmol/L (ref 134–144)
Total Protein: 6.8 g/dL (ref 6.0–8.5)

## 2020-02-03 LAB — SPECIMEN STATUS REPORT

## 2020-02-03 LAB — CBC
Hematocrit: 41.4 % (ref 37.5–51.0)
Hemoglobin: 13.5 g/dL (ref 13.0–17.7)
MCH: 28.1 pg (ref 26.6–33.0)
MCHC: 32.6 g/dL (ref 31.5–35.7)
MCV: 86 fL (ref 79–97)
Platelets: 122 10*3/uL — ABNORMAL LOW (ref 150–450)
RBC: 4.81 x10E6/uL (ref 4.14–5.80)
RDW: 14.5 % (ref 11.6–15.4)
WBC: 4.9 10*3/uL (ref 3.4–10.8)

## 2020-02-03 LAB — NOVEL CORONAVIRUS, NAA: SARS-CoV-2, NAA: NOT DETECTED

## 2020-02-03 LAB — SARS-COV-2, NAA 2 DAY TAT

## 2020-02-03 LAB — HEPATITIS C ANTIBODY: Hep C Virus Ab: 0.2 s/co ratio (ref 0.0–0.9)

## 2020-02-06 ENCOUNTER — Encounter: Payer: Self-pay | Admitting: Family Medicine

## 2020-02-06 DIAGNOSIS — R55 Syncope and collapse: Secondary | ICD-10-CM | POA: Insufficient documentation

## 2020-02-06 HISTORY — DX: Syncope and collapse: R55

## 2020-02-06 NOTE — Assessment & Plan Note (Addendum)
Differential diagnosis includes heatstroke, Covid, dehydration, vasovagal syncope, valvular disease, anemia, electrolyte abnormalities.  -Obtain a EKG -CMP, CBC, lipid panel, A1c; risk stratification labs -Obtain Covid testing -Consider echo pending work-up

## 2020-02-25 ENCOUNTER — Other Ambulatory Visit: Payer: Self-pay | Admitting: Family Medicine

## 2020-02-26 NOTE — Telephone Encounter (Signed)
Patient calls nurse line regarding receiving rx refill. Patient states that he is completely out of medication and has been calling for two weeks.   To PCP  Talbot Grumbling, RN

## 2020-06-10 DIAGNOSIS — R972 Elevated prostate specific antigen [PSA]: Secondary | ICD-10-CM | POA: Diagnosis not present

## 2020-07-02 DIAGNOSIS — N401 Enlarged prostate with lower urinary tract symptoms: Secondary | ICD-10-CM | POA: Diagnosis not present

## 2020-07-02 DIAGNOSIS — R972 Elevated prostate specific antigen [PSA]: Secondary | ICD-10-CM | POA: Diagnosis not present

## 2020-07-02 DIAGNOSIS — R3912 Poor urinary stream: Secondary | ICD-10-CM | POA: Diagnosis not present

## 2020-07-10 ENCOUNTER — Encounter (HOSPITAL_COMMUNITY): Payer: Self-pay

## 2020-07-10 ENCOUNTER — Emergency Department (HOSPITAL_COMMUNITY)
Admission: EM | Admit: 2020-07-10 | Discharge: 2020-07-10 | Disposition: A | Discharge status: Home / Self Care | Payer: Medicare HMO | Attending: Emergency Medicine | Admitting: Emergency Medicine

## 2020-07-10 ENCOUNTER — Emergency Department (HOSPITAL_COMMUNITY): Payer: Medicare HMO

## 2020-07-10 DIAGNOSIS — Z23 Encounter for immunization: Secondary | ICD-10-CM | POA: Diagnosis not present

## 2020-07-10 DIAGNOSIS — Z85858 Personal history of malignant neoplasm of other endocrine glands: Secondary | ICD-10-CM | POA: Diagnosis not present

## 2020-07-10 DIAGNOSIS — I10 Essential (primary) hypertension: Secondary | ICD-10-CM | POA: Insufficient documentation

## 2020-07-10 DIAGNOSIS — Z87891 Personal history of nicotine dependence: Secondary | ICD-10-CM | POA: Diagnosis not present

## 2020-07-10 DIAGNOSIS — J3489 Other specified disorders of nose and nasal sinuses: Secondary | ICD-10-CM | POA: Diagnosis not present

## 2020-07-10 DIAGNOSIS — S0993XA Unspecified injury of face, initial encounter: Secondary | ICD-10-CM | POA: Diagnosis not present

## 2020-07-10 DIAGNOSIS — W3301XA Accidental discharge of shotgun, initial encounter: Secondary | ICD-10-CM

## 2020-07-10 DIAGNOSIS — S0003XA Contusion of scalp, initial encounter: Secondary | ICD-10-CM | POA: Insufficient documentation

## 2020-07-10 DIAGNOSIS — E119 Type 2 diabetes mellitus without complications: Secondary | ICD-10-CM | POA: Diagnosis not present

## 2020-07-10 DIAGNOSIS — W3400XA Accidental discharge from unspecified firearms or gun, initial encounter: Secondary | ICD-10-CM | POA: Diagnosis not present

## 2020-07-10 DIAGNOSIS — S0990XA Unspecified injury of head, initial encounter: Secondary | ICD-10-CM | POA: Diagnosis not present

## 2020-07-10 MED ORDER — TETANUS-DIPHTH-ACELL PERTUSSIS 5-2.5-18.5 LF-MCG/0.5 IM SUSY
0.5000 mL | PREFILLED_SYRINGE | Freq: Once | INTRAMUSCULAR | Status: AC
  Administered 2020-07-10: 0.5 mL via INTRAMUSCULAR
  Filled 2020-07-10: qty 0.5
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
## 2020-07-12 ENCOUNTER — Encounter: Payer: Self-pay | Admitting: Family Medicine

## 2020-07-12 ENCOUNTER — Other Ambulatory Visit: Payer: Self-pay

## 2020-07-12 ENCOUNTER — Ambulatory Visit (INDEPENDENT_AMBULATORY_CARE_PROVIDER_SITE_OTHER): Payer: Medicare HMO | Admitting: Family Medicine

## 2020-07-12 VITALS — BP 132/70 | HR 85 | Ht 70.0 in | Wt 170.6 lb

## 2020-07-12 DIAGNOSIS — E119 Type 2 diabetes mellitus without complications: Secondary | ICD-10-CM

## 2020-07-12 LAB — POCT GLYCOSYLATED HEMOGLOBIN (HGB A1C): Hemoglobin A1C: 6.9 % — AB (ref 4.0–5.6)
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
## 2020-07-12 NOTE — Patient Instructions (Signed)
It was a pleasure seeing you today.  Regarding your head wound it appears well and does not look infected.  I want you to continue to keep it clean and if you have any new weaknesses, inability to walk, slurred speech, changes in sensation please be reevaluated.  Regarding your diabetes your hemoglobin A1c was 6.9 today from 7.0 at the previous check.  I'm happy with his hemoglobin A1c and I see no need for you to change her diabetic regimen at this time.  If you have any questions, issues, concerns please are free to call the clinic.  I hope you have a wonderful afternoon!

## 2020-07-14 NOTE — Assessment & Plan Note (Addendum)
Doing well on current medication regimen.  Hemoglobin A1c is 6.9 from 7.0 previous visit.  Feels like he is doing well and denies any hypoglycemic episodes. -Continue diet controlled diabetes, no need to initiate medications at this time -Follow-up in 3-6 months

## 2020-07-17 ENCOUNTER — Other Ambulatory Visit: Payer: Self-pay | Admitting: Urology

## 2020-07-17 DIAGNOSIS — R972 Elevated prostate specific antigen [PSA]: Secondary | ICD-10-CM

## 2020-08-09 ENCOUNTER — Other Ambulatory Visit: Payer: Self-pay

## 2020-08-09 ENCOUNTER — Encounter: Payer: Self-pay | Admitting: Family Medicine

## 2020-08-09 ENCOUNTER — Ambulatory Visit (INDEPENDENT_AMBULATORY_CARE_PROVIDER_SITE_OTHER): Payer: Medicare Other | Admitting: Family Medicine

## 2020-08-09 VITALS — BP 145/82 | HR 79 | Ht 70.0 in | Wt 170.4 lb

## 2020-08-09 DIAGNOSIS — W3301XD Accidental discharge of shotgun, subsequent encounter: Secondary | ICD-10-CM | POA: Diagnosis not present

## 2020-08-09 DIAGNOSIS — M1711 Unilateral primary osteoarthritis, right knee: Secondary | ICD-10-CM

## 2020-08-09 DIAGNOSIS — W3301XA Accidental discharge of shotgun, initial encounter: Secondary | ICD-10-CM | POA: Insufficient documentation

## 2020-08-09 HISTORY — DX: Accidental discharge of shotgun, initial encounter: W33.01XA

## 2020-08-09 MED ORDER — MELOXICAM 15 MG PO TABS: 15.0000 mg | ORAL_TABLET | Freq: Every day | ORAL | 0 refills | Status: DC

## 2020-08-09 NOTE — Assessment & Plan Note (Addendum)
Stable. Continue Mobic and Tylenol as needed.

## 2020-08-09 NOTE — Patient Instructions (Signed)
It was great seeing you today!   As discusssed, take the meloxicam as needed for pain. If your pain is not improving in the month make an appointment.    I'd like to see you back 4 if your pain does not improve but if you need to be seen earlier than that for any new issues we're happy to fit you in, just give [place] a call!    Take care,   [place]
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
## 2020-08-09 NOTE — Assessment & Plan Note (Signed)
>>  ASSESSMENT AND PLAN FOR OSTEOARTHRITIS, KNEE, RIGHT WRITTEN ON 08/09/2020  8:18 PM BY BRIMAGE, VONDRA, DO  Stable. Continue Mobic  and Tylenol  as needed.

## 2020-08-15 ENCOUNTER — Other Ambulatory Visit: Payer: Self-pay

## 2020-08-15 ENCOUNTER — Ambulatory Visit
Admission: RE | Admit: 2020-08-15 | Discharge: 2020-08-15 | Disposition: A | Discharge status: Home / Self Care | Payer: Medicare Other | Source: Ambulatory Visit | Attending: Urology | Admitting: Urology

## 2020-08-15 DIAGNOSIS — R972 Elevated prostate specific antigen [PSA]: Secondary | ICD-10-CM

## 2020-08-15 MED ORDER — GADOBENATE DIMEGLUMINE 529 MG/ML IV SOLN
15.0000 mL | Freq: Once | INTRAVENOUS | Status: AC | PRN
  Administered 2020-08-15: 15 mL via INTRAVENOUS

## 2020-09-05 DIAGNOSIS — E119 Type 2 diabetes mellitus without complications: Secondary | ICD-10-CM | POA: Diagnosis not present

## 2020-09-23 ENCOUNTER — Other Ambulatory Visit: Payer: Self-pay | Admitting: Family Medicine

## 2020-09-23 DIAGNOSIS — M1711 Unilateral primary osteoarthritis, right knee: Secondary | ICD-10-CM

## 2020-10-16 ENCOUNTER — Other Ambulatory Visit: Payer: Self-pay | Admitting: Family Medicine

## 2020-10-16 DIAGNOSIS — H26491 Other secondary cataract, right eye: Secondary | ICD-10-CM | POA: Diagnosis not present

## 2020-10-16 DIAGNOSIS — Z961 Presence of intraocular lens: Secondary | ICD-10-CM | POA: Diagnosis not present

## 2020-10-30 DIAGNOSIS — H26492 Other secondary cataract, left eye: Secondary | ICD-10-CM | POA: Diagnosis not present

## 2020-11-20 ENCOUNTER — Other Ambulatory Visit: Payer: Self-pay | Admitting: Family Medicine

## 2020-11-20 DIAGNOSIS — M1711 Unilateral primary osteoarthritis, right knee: Secondary | ICD-10-CM

## 2020-12-12 ENCOUNTER — Other Ambulatory Visit: Payer: Self-pay | Admitting: Family Medicine

## 2020-12-26 ENCOUNTER — Other Ambulatory Visit: Payer: Self-pay

## 2020-12-26 ENCOUNTER — Encounter: Payer: Self-pay | Admitting: Family Medicine

## 2020-12-26 ENCOUNTER — Ambulatory Visit (INDEPENDENT_AMBULATORY_CARE_PROVIDER_SITE_OTHER): Payer: Medicare Other | Admitting: Family Medicine

## 2020-12-26 VITALS — BP 122/70 | HR 79 | Wt 169.0 lb

## 2020-12-26 DIAGNOSIS — H6012 Cellulitis of left external ear: Secondary | ICD-10-CM

## 2020-12-26 HISTORY — DX: Cellulitis of left external ear: H60.12

## 2020-12-26 MED ORDER — DOXYCYCLINE HYCLATE 100 MG PO TABS: 100.0000 mg | ORAL_TABLET | Freq: Two times a day (BID) | ORAL | 0 refills | Status: DC

## 2020-12-26 MED ORDER — DOXYCYCLINE HYCLATE 100 MG PO TABS: 100.0000 mg | ORAL_TABLET | Freq: Two times a day (BID) | ORAL | 0 refills | Status: AC

## 2020-12-26 NOTE — Patient Instructions (Signed)
I believe this is an infection of your ear's skin.  Take the antibiotic Doxycycline 100 mg tablet, one tablet twice a day for 10 days.  Use ice 4 to 5 times a day for 20 minutes each time to bring down the swelling.   You may use Tylenol for pain if you need it  The Gold Bond dream is a good choice to use to rub on the sore ear.   If the swelling gets worse, please let us know.

## 2020-12-26 NOTE — Progress Notes (Signed)
Edward Newton is alone Sources of clinical information for visit is/are patient. Nursing assessment for this office visit was reviewed with the patient for accuracy and revision.     Previous Report(s) Reviewed: historical medical records and office notes  Depression screen [facility] 2/9 08/09/2020  Decreased Interest 2  Down, Depressed, Hopeless 0  PHQ - 2 Score 2  Altered sleeping 0  Tired, decreased energy 0  Change in appetite 0  Feeling bad or failure about yourself  0  Trouble concentrating 0  Moving slowly or fidgety/restless 0  Suicidal thoughts 0  PHQ-9 Score 2  Difficult doing work/chores Not difficult at all    Fall Risk  12/26/2020 08/09/2020 07/12/2020 02/02/2020 09/04/2019  Falls in the past year? 0 0 0 1 0  Number falls in past yr: 0 0 0 0 0  Injury with Fall? - 0 - - 0  Follow up - - Falls evaluation completed - -    PHQ9 SCORE ONLY 08/09/2020 07/12/2020 02/02/2020  PHQ-9 Total Score 2 3 0    Adult vaccines due  Topic Date Due   TETANUS/TDAP  07/10/2030    Health Maintenance Due  Topic Date Due   Zoster Vaccines- Shingrix (1 of 2) Never done   OPHTHALMOLOGY EXAM  11/24/2018   FOOT EXAM  06/21/2020   URINE MICROALBUMIN  06/21/2020   COVID-19 Vaccine (4 - Booster) 07/31/2020      History/P.E. limitations: none  Adult vaccines due  Topic Date Due   TETANUS/TDAP  07/10/2030    Diabetes Health Maintenance Due  Topic Date Due   OPHTHALMOLOGY EXAM  11/24/2018   FOOT EXAM  06/21/2020   URINE MICROALBUMIN  06/21/2020   HEMOGLOBIN A1C  01/09/2021    Health Maintenance Due  Topic Date Due   Zoster Vaccines- Shingrix (1 of 2) Never done   OPHTHALMOLOGY EXAM  11/24/2018   FOOT EXAM  06/21/2020   URINE MICROALBUMIN  06/21/2020   COVID-19 Vaccine (4 - Booster) 07/31/2020     Chief Complaint  Patient presents with   Tick Bite

## 2020-12-26 NOTE — Assessment & Plan Note (Signed)
New problem, acute Tick bite top of left ear 4 days ago Tick removed using Bold Bond cream Noted swelling, itching, and tenderness of left ear that is now improving Associated tenderness and palpable nodule left posterior neck No fever, no rashes, no myalgias, no Flu-like symptoms  PE Left ear Generalized swelling of left pinna Small superficial erosion superior helix skin, ~4 mm width, no discharge, drainage, adherent slough TTP around erosion EAC clear, TM visible without erythem of bulging  Palpable LN left posterior neck chain < 1 cm size, tender to palpation, mobile  A/ Left pinna cellulitis secondary to skin damage from tick bite and its sequela - Doubt tick-borne illness - Improving per patient  P/ - Start Doxy 100 mg twice a day x 10 days - Watch for increase pain or swelling - notify office if occurs.  _ ICE 4-5 times a day - APAP for pain

## 2021-02-26 DIAGNOSIS — R3914 Feeling of incomplete bladder emptying: Secondary | ICD-10-CM | POA: Diagnosis not present

## 2021-02-26 DIAGNOSIS — R3912 Poor urinary stream: Secondary | ICD-10-CM | POA: Diagnosis not present

## 2021-03-14 ENCOUNTER — Other Ambulatory Visit: Payer: Self-pay | Admitting: Family Medicine

## 2021-03-14 DIAGNOSIS — M1711 Unilateral primary osteoarthritis, right knee: Secondary | ICD-10-CM

## 2021-04-01 ENCOUNTER — Other Ambulatory Visit: Payer: Self-pay | Admitting: Family Medicine

## 2021-04-20 ENCOUNTER — Emergency Department (HOSPITAL_COMMUNITY): Payer: Medicare Other

## 2021-04-20 ENCOUNTER — Encounter (HOSPITAL_COMMUNITY): Payer: Self-pay | Admitting: Emergency Medicine

## 2021-04-20 ENCOUNTER — Other Ambulatory Visit: Payer: Self-pay

## 2021-04-20 ENCOUNTER — Emergency Department (HOSPITAL_COMMUNITY)
Admission: EM | Admit: 2021-04-20 | Discharge: 2021-04-21 | Disposition: A | Discharge status: Home / Self Care | Payer: Medicare Other | Attending: Emergency Medicine | Admitting: Emergency Medicine

## 2021-04-20 DIAGNOSIS — M79605 Pain in left leg: Secondary | ICD-10-CM | POA: Diagnosis not present

## 2021-04-20 DIAGNOSIS — Z79899 Other long term (current) drug therapy: Secondary | ICD-10-CM | POA: Diagnosis not present

## 2021-04-20 DIAGNOSIS — M542 Cervicalgia: Secondary | ICD-10-CM | POA: Insufficient documentation

## 2021-04-20 DIAGNOSIS — S01112A Laceration without foreign body of left eyelid and periocular area, initial encounter: Secondary | ICD-10-CM | POA: Diagnosis not present

## 2021-04-20 DIAGNOSIS — S0531XA Ocular laceration without prolapse or loss of intraocular tissue, right eye, initial encounter: Secondary | ICD-10-CM | POA: Insufficient documentation

## 2021-04-20 DIAGNOSIS — R0789 Other chest pain: Secondary | ICD-10-CM | POA: Diagnosis not present

## 2021-04-20 DIAGNOSIS — Z743 Need for continuous supervision: Secondary | ICD-10-CM | POA: Diagnosis not present

## 2021-04-20 DIAGNOSIS — I10 Essential (primary) hypertension: Secondary | ICD-10-CM | POA: Diagnosis not present

## 2021-04-20 DIAGNOSIS — S01411A Laceration without foreign body of right cheek and temporomandibular area, initial encounter: Secondary | ICD-10-CM | POA: Insufficient documentation

## 2021-04-20 DIAGNOSIS — M25572 Pain in left ankle and joints of left foot: Secondary | ICD-10-CM | POA: Diagnosis not present

## 2021-04-20 DIAGNOSIS — Y92410 Unspecified street and highway as the place of occurrence of the external cause: Secondary | ICD-10-CM | POA: Insufficient documentation

## 2021-04-20 DIAGNOSIS — M79662 Pain in left lower leg: Secondary | ICD-10-CM | POA: Diagnosis not present

## 2021-04-20 DIAGNOSIS — R0781 Pleurodynia: Secondary | ICD-10-CM | POA: Diagnosis not present

## 2021-04-20 DIAGNOSIS — E119 Type 2 diabetes mellitus without complications: Secondary | ICD-10-CM | POA: Insufficient documentation

## 2021-04-20 DIAGNOSIS — S01111A Laceration without foreign body of right eyelid and periocular area, initial encounter: Secondary | ICD-10-CM | POA: Diagnosis not present

## 2021-04-20 DIAGNOSIS — S0591XA Unspecified injury of right eye and orbit, initial encounter: Secondary | ICD-10-CM | POA: Diagnosis present

## 2021-04-20 DIAGNOSIS — M25522 Pain in left elbow: Secondary | ICD-10-CM | POA: Diagnosis not present

## 2021-04-20 DIAGNOSIS — Z85858 Personal history of malignant neoplasm of other endocrine glands: Secondary | ICD-10-CM | POA: Diagnosis not present

## 2021-04-20 DIAGNOSIS — R58 Hemorrhage, not elsewhere classified: Secondary | ICD-10-CM | POA: Diagnosis not present

## 2021-04-20 DIAGNOSIS — Z87891 Personal history of nicotine dependence: Secondary | ICD-10-CM | POA: Insufficient documentation

## 2021-04-20 DIAGNOSIS — S0181XA Laceration without foreign body of other part of head, initial encounter: Secondary | ICD-10-CM | POA: Diagnosis not present

## 2021-04-20 DIAGNOSIS — R519 Headache, unspecified: Secondary | ICD-10-CM | POA: Diagnosis not present

## 2021-04-20 DIAGNOSIS — M25521 Pain in right elbow: Secondary | ICD-10-CM | POA: Diagnosis not present

## 2021-04-20 MED ORDER — OXYCODONE-ACETAMINOPHEN 5-325 MG PO TABS
1.0000 | ORAL_TABLET | Freq: Once | ORAL | Status: AC
  Administered 2021-04-20: 1 via ORAL
  Filled 2021-04-20: qty 1
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
## 2021-04-21 ENCOUNTER — Emergency Department (HOSPITAL_COMMUNITY): Payer: Medicare Other

## 2021-04-21 DIAGNOSIS — M25521 Pain in right elbow: Secondary | ICD-10-CM | POA: Diagnosis not present

## 2021-04-21 MED ORDER — TRAMADOL HCL 50 MG PO TABS: 50.0000 mg | ORAL_TABLET | Freq: Four times a day (QID) | ORAL | 0 refills | Status: DC | PRN

## 2021-04-21 NOTE — ED Notes (Signed)
This RN irrigated pt's facial lacerations and cleaned the wounds with sterile gauze and hydrogen peroxide.

## 2021-04-21 NOTE — ED Notes (Signed)
E-signature pad unavailable at time of pt discharge. This RN discussed discharge materials with pt and answered all pt questions. Pt stated understanding of discharge material. ? ?

## 2021-04-21 NOTE — ED Notes (Signed)
EMS C-Collar removed by Dr. Betsey Holiday
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
## 2021-06-25 ENCOUNTER — Ambulatory Visit
Admission: RE | Admit: 2021-06-25 | Discharge: 2021-06-25 | Disposition: A | Discharge status: Home / Self Care | Payer: Medicare Other | Source: Ambulatory Visit | Attending: Family Medicine | Admitting: Family Medicine

## 2021-06-25 ENCOUNTER — Ambulatory Visit (INDEPENDENT_AMBULATORY_CARE_PROVIDER_SITE_OTHER): Payer: Medicare Other | Admitting: Family Medicine

## 2021-06-25 ENCOUNTER — Other Ambulatory Visit: Payer: Self-pay

## 2021-06-25 ENCOUNTER — Other Ambulatory Visit: Payer: Self-pay | Admitting: Family Medicine

## 2021-06-25 VITALS — BP 126/70 | HR 74 | Wt 167.0 lb

## 2021-06-25 DIAGNOSIS — E119 Type 2 diabetes mellitus without complications: Secondary | ICD-10-CM

## 2021-06-25 DIAGNOSIS — M25562 Pain in left knee: Secondary | ICD-10-CM

## 2021-06-25 DIAGNOSIS — M25461 Effusion, right knee: Secondary | ICD-10-CM | POA: Diagnosis not present

## 2021-06-25 DIAGNOSIS — G8929 Other chronic pain: Secondary | ICD-10-CM | POA: Diagnosis not present

## 2021-06-25 DIAGNOSIS — E782 Mixed hyperlipidemia: Secondary | ICD-10-CM

## 2021-06-25 DIAGNOSIS — M1711 Unilateral primary osteoarthritis, right knee: Secondary | ICD-10-CM

## 2021-06-25 DIAGNOSIS — M25561 Pain in right knee: Secondary | ICD-10-CM

## 2021-06-25 DIAGNOSIS — G894 Chronic pain syndrome: Secondary | ICD-10-CM | POA: Diagnosis not present

## 2021-06-25 DIAGNOSIS — I10 Essential (primary) hypertension: Secondary | ICD-10-CM | POA: Diagnosis not present

## 2021-06-25 DIAGNOSIS — M25552 Pain in left hip: Secondary | ICD-10-CM

## 2021-06-25 LAB — POCT GLYCOSYLATED HEMOGLOBIN (HGB A1C): HbA1c, POC (controlled diabetic range): 7.4 % — AB (ref 0.0–7.0)

## 2021-06-25 MED ORDER — MELOXICAM 15 MG PO TABS: 15.0000 mg | ORAL_TABLET | Freq: Every day | ORAL | 0 refills | Status: DC | PRN

## 2021-06-25 NOTE — Patient Instructions (Signed)
It was great seeing you today!  Please check-out at the front desk before leaving the clinic. I'd like to see you back in 3-6 months but if you need to be seen earlier than that for any new issues we're happy to fit you in, just give [place] a call!  Visit Remembers: - Stop by the pharmacy to pick up your prescriptions  - Continue to work on your healthy eating habits and incorporating exercise into your daily life.  - Your BP is at goal  - Medicine Changes: None - To Do: Stop by [facility] to get an xrays of your hip and knee.    [facility] [place]  In [facility]  [phone] Open ? Closes 5:30PM  [facility] [place]  [phone] Open ? Closes 5PM     Regarding lab work today:  I will call you with any abnormal labs that need to be followed up on.   Please bring all of your medications with you to each visit.    If you haven't already, sign up for My Chart to have easy access to your labs results, and communication with your primary care physician.  Feel free to call with any questions or concerns at any time, at [phone].   Take care,  Dr. Rushie [facility]
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
## 2021-06-26 LAB — BASIC METABOLIC PANEL
BUN/Creatinine Ratio: 15 (ref 10–24)
BUN: 16 mg/dL (ref 8–27)
CO2: 25 mmol/L (ref 20–29)
Calcium: 9.4 mg/dL (ref 8.6–10.2)
Chloride: 102 mmol/L (ref 96–106)
Creatinine, Ser: 1.1 mg/dL (ref 0.76–1.27)
Glucose: 149 mg/dL — ABNORMAL HIGH (ref 70–99)
Potassium: 3.7 mmol/L (ref 3.5–5.2)
Sodium: 140 mmol/L (ref 134–144)
eGFR: 68 mL/min/{1.73_m2} (ref >59)

## 2021-06-26 LAB — LIPID PANEL
Chol/HDL Ratio: 2.8 ratio (ref 0.0–5.0)
Cholesterol, Total: 131 mg/dL (ref 100–199)
HDL: 46 mg/dL (ref >39)
LDL Chol Calc (NIH): 74 mg/dL (ref 0–99)
Triglycerides: 47 mg/dL (ref 0–149)
VLDL Cholesterol Cal: 11 mg/dL (ref 5–40)

## 2021-06-26 LAB — MICROALBUMIN / CREATININE URINE RATIO
Creatinine, Urine: 148.7 mg/dL
Microalb/Creat Ratio: 5 mg/g creat (ref 0–29)
Microalbumin, Urine: 7.5 ug/mL

## 2021-06-28 ENCOUNTER — Encounter: Payer: Self-pay | Admitting: Family Medicine

## 2021-06-28 NOTE — Assessment & Plan Note (Addendum)
Diet controlled.  A1c today 7.4 and previously 6.9.  Encouraged diet rich in vegetables and complex carbs.  Increase exercise as tolerated. Statin: Lipitor ACEi/ARB: None, angioedema to lisinopril Urine microalbumin: Collected today Eye exam: Advised to obtain Pneumococcal vaccine: Up-to-date

## 2021-06-28 NOTE — Assessment & Plan Note (Signed)
Continue Lipitor.  Obtain lipid panel today.

## 2021-06-28 NOTE — Assessment & Plan Note (Signed)
BP stable and at goal.  Continue hydrochlorothiazide 12.5 mg twice daily.  BMP today.

## 2021-07-21 ENCOUNTER — Other Ambulatory Visit: Payer: Self-pay | Admitting: Family Medicine

## 2021-07-21 DIAGNOSIS — M1711 Unilateral primary osteoarthritis, right knee: Secondary | ICD-10-CM

## 2021-09-16 ENCOUNTER — Other Ambulatory Visit: Payer: Self-pay | Admitting: Family Medicine

## 2021-10-27 DIAGNOSIS — Z961 Presence of intraocular lens: Secondary | ICD-10-CM | POA: Diagnosis not present

## 2021-10-27 DIAGNOSIS — E119 Type 2 diabetes mellitus without complications: Secondary | ICD-10-CM | POA: Diagnosis not present

## 2021-10-29 DIAGNOSIS — R3914 Feeling of incomplete bladder emptying: Secondary | ICD-10-CM | POA: Diagnosis not present

## 2021-10-29 LAB — HM DIABETES EYE EXAM

## 2021-11-18 ENCOUNTER — Encounter: Payer: Self-pay | Admitting: *Deleted

## 2021-12-17 ENCOUNTER — Other Ambulatory Visit: Payer: Self-pay | Admitting: Family Medicine

## 2022-01-16 ENCOUNTER — Telehealth: Payer: Self-pay | Admitting: Family Medicine
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
## 2022-02-12 ENCOUNTER — Encounter: Payer: Self-pay | Admitting: Family Medicine

## 2022-02-12 ENCOUNTER — Other Ambulatory Visit: Payer: Self-pay

## 2022-02-12 ENCOUNTER — Ambulatory Visit (INDEPENDENT_AMBULATORY_CARE_PROVIDER_SITE_OTHER): Payer: Medicare Other | Admitting: Family Medicine

## 2022-02-12 VITALS — BP 131/79 | HR 88 | Wt 168.0 lb

## 2022-02-12 DIAGNOSIS — M1612 Unilateral primary osteoarthritis, left hip: Secondary | ICD-10-CM | POA: Diagnosis not present

## 2022-02-12 DIAGNOSIS — M1711 Unilateral primary osteoarthritis, right knee: Secondary | ICD-10-CM

## 2022-02-12 MED ORDER — MELOXICAM 15 MG PO TABS: 15.0000 mg | ORAL_TABLET | Freq: Every day | ORAL | 2 refills | Status: DC
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
## 2022-02-12 NOTE — Patient Instructions (Signed)
It was great to see you today! Thank you for choosing [facility] for your primary care. Edward Newton was seen for Left hip pain .  Today we addressed: Left hip pain - We went over your hip X-rays and you have Osteoarthritis or OA. I have ordered some more pain medication for you, but this is a progressive disease. As you are relatively healthy and functional I believe it would be helpful to get evaluated for a hip replacement. I have referred you to Orthopedics (Bone surgeons).   If you haven't already, sign up for My Chart to have easy access to your labs results, and communication with your primary care physician.  We are checking some labs today. If they are abnormal, I will call you. If they are normal, I will send you a MyChart message (if it is active) or a letter in the mail. If you do not hear about your labs in the next 2 weeks, please call the office.   You should return to our clinic No follow-ups on file.  I recommend that you always bring your medications to each appointment as this makes it easy to ensure you are on the correct medications and helps [place] not miss refills when you need them.  Please arrive 15 minutes before your appointment to ensure smooth check in process.  We appreciate your efforts in making this happen.  Please call the clinic at [phone] if your symptoms worsen or you have any concerns.  Thank you for allowing me to participate in your care, Lowry Ram, MD 02/12/2022, 10:55 AM PGY-1, [place]
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
## 2022-02-18 ENCOUNTER — Ambulatory Visit: Payer: Medicare Other | Admitting: Orthopaedic Surgery

## 2022-02-18 ENCOUNTER — Ambulatory Visit (INDEPENDENT_AMBULATORY_CARE_PROVIDER_SITE_OTHER): Payer: Medicare Other

## 2022-02-18 DIAGNOSIS — M1612 Unilateral primary osteoarthritis, left hip: Secondary | ICD-10-CM
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
## 2022-05-27 ENCOUNTER — Encounter (HOSPITAL_COMMUNITY): Payer: Self-pay | Admitting: Emergency Medicine

## 2022-05-27 ENCOUNTER — Inpatient Hospital Stay (HOSPITAL_COMMUNITY)
Admission: EM | Admit: 2022-05-27 | Discharge: 2022-06-09 | DRG: 330 | Disposition: A | Discharge status: Home Health | Payer: Medicare Other | Source: Ambulatory Visit | Attending: Family Medicine | Admitting: Family Medicine

## 2022-05-27 ENCOUNTER — Emergency Department (HOSPITAL_COMMUNITY): Payer: Medicare Other

## 2022-05-27 ENCOUNTER — Other Ambulatory Visit: Payer: Self-pay

## 2022-05-27 ENCOUNTER — Ambulatory Visit (INDEPENDENT_AMBULATORY_CARE_PROVIDER_SITE_OTHER): Payer: Medicare Other | Admitting: Family Medicine

## 2022-05-27 ENCOUNTER — Observation Stay (HOSPITAL_COMMUNITY): Payer: Medicare Other

## 2022-05-27 VITALS — BP 124/72 | HR 88 | Wt 161.2 lb

## 2022-05-27 DIAGNOSIS — Z9049 Acquired absence of other specified parts of digestive tract: Secondary | ICD-10-CM

## 2022-05-27 DIAGNOSIS — Z8249 Family history of ischemic heart disease and other diseases of the circulatory system: Secondary | ICD-10-CM | POA: Diagnosis not present

## 2022-05-27 DIAGNOSIS — M1711 Unilateral primary osteoarthritis, right knee: Secondary | ICD-10-CM | POA: Diagnosis not present

## 2022-05-27 DIAGNOSIS — D696 Thrombocytopenia, unspecified: Secondary | ICD-10-CM

## 2022-05-27 DIAGNOSIS — E1165 Type 2 diabetes mellitus with hyperglycemia: Secondary | ICD-10-CM | POA: Diagnosis not present

## 2022-05-27 DIAGNOSIS — K565 Intestinal adhesions [bands], unspecified as to partial versus complete obstruction: Secondary | ICD-10-CM | POA: Diagnosis not present

## 2022-05-27 DIAGNOSIS — Z4682 Encounter for fitting and adjustment of non-vascular catheter: Secondary | ICD-10-CM | POA: Diagnosis not present

## 2022-05-27 DIAGNOSIS — Z79899 Other long term (current) drug therapy: Secondary | ICD-10-CM

## 2022-05-27 DIAGNOSIS — N289 Disorder of kidney and ureter, unspecified: Secondary | ICD-10-CM | POA: Diagnosis present

## 2022-05-27 DIAGNOSIS — T8141XA Infection following a procedure, superficial incisional surgical site, initial encounter: Secondary | ICD-10-CM | POA: Diagnosis not present

## 2022-05-27 DIAGNOSIS — J9811 Atelectasis: Secondary | ICD-10-CM | POA: Diagnosis not present

## 2022-05-27 DIAGNOSIS — R059 Cough, unspecified: Secondary | ICD-10-CM | POA: Diagnosis not present

## 2022-05-27 DIAGNOSIS — Y733 Surgical instruments, materials and gastroenterology and urology devices (including sutures) associated with adverse incidents: Secondary | ICD-10-CM | POA: Diagnosis not present

## 2022-05-27 DIAGNOSIS — R739 Hyperglycemia, unspecified: Secondary | ICD-10-CM | POA: Insufficient documentation

## 2022-05-27 DIAGNOSIS — R1084 Generalized abdominal pain: Secondary | ICD-10-CM | POA: Insufficient documentation

## 2022-05-27 DIAGNOSIS — R110 Nausea: Secondary | ICD-10-CM | POA: Diagnosis not present

## 2022-05-27 DIAGNOSIS — E870 Hyperosmolality and hypernatremia: Secondary | ICD-10-CM | POA: Insufficient documentation

## 2022-05-27 DIAGNOSIS — N2889 Other specified disorders of kidney and ureter: Secondary | ICD-10-CM | POA: Diagnosis not present

## 2022-05-27 DIAGNOSIS — Z9079 Acquired absence of other genital organ(s): Secondary | ICD-10-CM

## 2022-05-27 DIAGNOSIS — K529 Noninfective gastroenteritis and colitis, unspecified: Secondary | ICD-10-CM | POA: Diagnosis not present

## 2022-05-27 DIAGNOSIS — R109 Unspecified abdominal pain: Secondary | ICD-10-CM | POA: Diagnosis not present

## 2022-05-27 DIAGNOSIS — K5651 Intestinal adhesions [bands], with partial obstruction: Secondary | ICD-10-CM | POA: Diagnosis not present

## 2022-05-27 DIAGNOSIS — Z85818 Personal history of malignant neoplasm of other sites of lip, oral cavity, and pharynx: Secondary | ICD-10-CM

## 2022-05-27 DIAGNOSIS — K769 Liver disease, unspecified: Secondary | ICD-10-CM | POA: Diagnosis not present

## 2022-05-27 DIAGNOSIS — E871 Hypo-osmolality and hyponatremia: Secondary | ICD-10-CM | POA: Diagnosis not present

## 2022-05-27 DIAGNOSIS — N179 Acute kidney failure, unspecified: Secondary | ICD-10-CM

## 2022-05-27 DIAGNOSIS — K6389 Other specified diseases of intestine: Secondary | ICD-10-CM | POA: Diagnosis not present

## 2022-05-27 DIAGNOSIS — K3189 Other diseases of stomach and duodenum: Secondary | ICD-10-CM | POA: Diagnosis not present

## 2022-05-27 DIAGNOSIS — Z7982 Long term (current) use of aspirin: Secondary | ICD-10-CM

## 2022-05-27 DIAGNOSIS — I159 Secondary hypertension, unspecified: Secondary | ICD-10-CM | POA: Diagnosis present

## 2022-05-27 DIAGNOSIS — E785 Hyperlipidemia, unspecified: Secondary | ICD-10-CM | POA: Diagnosis not present

## 2022-05-27 DIAGNOSIS — K5939 Other megacolon: Secondary | ICD-10-CM | POA: Diagnosis not present

## 2022-05-27 DIAGNOSIS — I4510 Unspecified right bundle-branch block: Secondary | ICD-10-CM | POA: Diagnosis not present

## 2022-05-27 DIAGNOSIS — I10 Essential (primary) hypertension: Secondary | ICD-10-CM | POA: Diagnosis present

## 2022-05-27 DIAGNOSIS — Z791 Long term (current) use of non-steroidal anti-inflammatories (NSAID): Secondary | ICD-10-CM

## 2022-05-27 DIAGNOSIS — Z923 Personal history of irradiation: Secondary | ICD-10-CM

## 2022-05-27 DIAGNOSIS — K56609 Unspecified intestinal obstruction, unspecified as to partial versus complete obstruction: Secondary | ICD-10-CM | POA: Diagnosis not present

## 2022-05-27 DIAGNOSIS — E119 Type 2 diabetes mellitus without complications: Secondary | ICD-10-CM | POA: Diagnosis not present

## 2022-05-27 DIAGNOSIS — M1612 Unilateral primary osteoarthritis, left hip: Secondary | ICD-10-CM | POA: Diagnosis not present

## 2022-05-27 DIAGNOSIS — R0902 Hypoxemia: Secondary | ICD-10-CM | POA: Diagnosis not present

## 2022-05-27 DIAGNOSIS — N400 Enlarged prostate without lower urinary tract symptoms: Secondary | ICD-10-CM | POA: Diagnosis present

## 2022-05-27 DIAGNOSIS — K5669 Other partial intestinal obstruction: Secondary | ICD-10-CM | POA: Diagnosis not present

## 2022-05-27 DIAGNOSIS — K567 Ileus, unspecified: Secondary | ICD-10-CM | POA: Diagnosis not present

## 2022-05-27 DIAGNOSIS — Z888 Allergy status to other drugs, medicaments and biological substances status: Secondary | ICD-10-CM

## 2022-05-27 DIAGNOSIS — Z87891 Personal history of nicotine dependence: Secondary | ICD-10-CM

## 2022-05-27 DIAGNOSIS — R0982 Postnasal drip: Secondary | ICD-10-CM | POA: Diagnosis not present

## 2022-05-27 DIAGNOSIS — E279 Disorder of adrenal gland, unspecified: Secondary | ICD-10-CM | POA: Diagnosis not present

## 2022-05-27 DIAGNOSIS — G4734 Idiopathic sleep related nonobstructive alveolar hypoventilation: Secondary | ICD-10-CM | POA: Insufficient documentation

## 2022-05-27 HISTORY — DX: Generalized abdominal pain: R10.84

## 2022-05-27 LAB — COMPREHENSIVE METABOLIC PANEL
ALT: 18 U/L (ref 0–44)
AST: 20 U/L (ref 15–41)
Albumin: 3.9 g/dL (ref 3.5–5.0)
Alkaline Phosphatase: 64 U/L (ref 38–126)
Anion gap: 12 (ref 5–15)
BUN: 21 mg/dL (ref 8–23)
CO2: 26 mmol/L (ref 22–32)
Calcium: 9.4 mg/dL (ref 8.9–10.3)
Chloride: 105 mmol/L (ref 98–111)
Creatinine, Ser: 1.49 mg/dL — ABNORMAL HIGH (ref 0.61–1.24)
GFR, Estimated: 47 mL/min — ABNORMAL LOW (ref >60)
Glucose, Bld: 167 mg/dL — ABNORMAL HIGH (ref 70–99)
Potassium: 3.9 mmol/L (ref 3.5–5.1)
Sodium: 143 mmol/L (ref 135–145)
Total Bilirubin: 1.4 mg/dL — ABNORMAL HIGH (ref 0.3–1.2)
Total Protein: 7.6 g/dL (ref 6.5–8.1)

## 2022-05-27 LAB — URINALYSIS, ROUTINE W REFLEX MICROSCOPIC
Bilirubin Urine: NEGATIVE
Glucose, UA: NEGATIVE mg/dL
Hgb urine dipstick: NEGATIVE
Ketones, ur: 20 mg/dL — AB
Leukocytes,Ua: NEGATIVE
Nitrite: NEGATIVE
Protein, ur: NEGATIVE mg/dL
Specific Gravity, Urine: 1.015 (ref 1.005–1.030)
pH: 5 (ref 5.0–8.0)

## 2022-05-27 LAB — I-STAT CHEM 8, ED
BUN: 22 mg/dL (ref 8–23)
Calcium, Ion: 1.12 mmol/L — ABNORMAL LOW (ref 1.15–1.40)
Chloride: 105 mmol/L (ref 98–111)
Creatinine, Ser: 1.5 mg/dL — ABNORMAL HIGH (ref 0.61–1.24)
Glucose, Bld: 168 mg/dL — ABNORMAL HIGH (ref 70–99)
HCT: 46 % (ref 39.0–52.0)
Hemoglobin: 15.6 g/dL (ref 13.0–17.0)
Potassium: 4 mmol/L (ref 3.5–5.1)
Sodium: 142 mmol/L (ref 135–145)
TCO2: 26 mmol/L (ref 22–32)

## 2022-05-27 LAB — CBC WITH DIFFERENTIAL/PLATELET
Basophils Absolute: 0.1 10*3/uL (ref 0.0–0.1)
Basophils Relative: 2 %
Eosinophils Absolute: 0 10*3/uL (ref 0.0–0.5)
Eosinophils Relative: 0 %
HCT: 45.3 % (ref 39.0–52.0)
Hemoglobin: 15.2 g/dL (ref 13.0–17.0)
Lymphocytes Relative: 31 %
Lymphs Abs: 1.1 10*3/uL (ref 0.7–4.0)
MCH: 28.7 pg (ref 26.0–34.0)
MCHC: 33.6 g/dL (ref 30.0–36.0)
MCV: 85.6 fL (ref 80.0–100.0)
Monocytes Absolute: 0.6 10*3/uL (ref 0.1–1.0)
Monocytes Relative: 17 %
Neutro Abs: 1.8 10*3/uL (ref 1.7–7.7)
Neutrophils Relative %: 50 %
Platelets: 58 10*3/uL — ABNORMAL LOW (ref 150–400)
RBC: 5.29 MIL/uL (ref 4.22–5.81)
RDW: 14.9 % (ref 11.5–15.5)
Smear Review: DECREASED
WBC: 3.6 10*3/uL — ABNORMAL LOW (ref 4.0–10.5)
nRBC: 0 % (ref 0.0–0.2)

## 2022-05-27 LAB — LIPASE, BLOOD: Lipase: 23 U/L (ref 11–51)

## 2022-05-27 MED ORDER — ONDANSETRON HCL 4 MG/2ML IJ SOLN
4.0000 mg | Freq: Once | INTRAMUSCULAR | Status: AC
  Administered 2022-05-27: 4 mg via INTRAVENOUS
  Filled 2022-05-27: qty 2

## 2022-05-27 MED ORDER — ONDANSETRON 4 MG PO TBDP
4.0000 mg | ORAL_TABLET | Freq: Once | ORAL | Status: AC
  Administered 2022-05-27: 4 mg via ORAL

## 2022-05-27 MED ORDER — ONDANSETRON HCL 4 MG/2ML IJ SOLN
4.0000 mg | Freq: Four times a day (QID) | INTRAMUSCULAR | Status: DC | PRN
  Administered 2022-05-28 – 2022-06-06 (×6): 4 mg via INTRAVENOUS
  Filled 2022-05-27 (×6): qty 2

## 2022-05-27 MED ORDER — DIATRIZOATE MEGLUMINE & SODIUM 66-10 % PO SOLN
90.0000 mL | Freq: Once | ORAL | Status: AC
  Administered 2022-05-27: 90 mL via NASOGASTRIC
  Filled 2022-05-27: qty 90

## 2022-05-27 MED ORDER — LACTATED RINGERS IV BOLUS
1000.0000 mL | Freq: Once | INTRAVENOUS | Status: AC
Start: 2022-05-27 — End: 2022-05-27
  Administered 2022-05-27: 1000 mL via INTRAVENOUS

## 2022-05-27 MED ORDER — HYDROMORPHONE HCL 1 MG/ML IJ SOLN: 1.0000 mg | Freq: Once | INTRAMUSCULAR | Status: DC

## 2022-05-27 MED ORDER — LACTATED RINGERS IV SOLN: INTRAVENOUS | Status: DC

## 2022-05-27 MED ORDER — HYDROMORPHONE HCL 1 MG/ML IJ SOLN
0.5000 mg | Freq: Once | INTRAMUSCULAR | Status: AC
Start: 2022-05-27 — End: 2022-05-27
  Administered 2022-05-27: 0.5 mg via INTRAVENOUS
  Filled 2022-05-27: qty 1

## 2022-05-27 MED ORDER — ACETAMINOPHEN 10 MG/ML IV SOLN
1000.0000 mg | Freq: Four times a day (QID) | INTRAVENOUS | Status: AC
  Administered 2022-05-27 – 2022-05-28 (×3): 1000 mg via INTRAVENOUS
  Filled 2022-05-27 (×5): qty 100

## 2022-05-27 MED ORDER — IOHEXOL 350 MG/ML SOLN
65.0000 mL | Freq: Once | INTRAVENOUS | Status: AC | PRN
  Administered 2022-05-27: 65 mL via INTRAVENOUS

## 2022-05-27 MED ORDER — FENTANYL CITRATE PF 50 MCG/ML IJ SOSY
50.0000 ug | PREFILLED_SYRINGE | Freq: Once | INTRAMUSCULAR | Status: AC
  Administered 2022-05-27: 50 ug via INTRAVENOUS
  Filled 2022-05-27: qty 1

## 2022-05-27 MED ORDER — HYDROMORPHONE HCL 1 MG/ML IJ SOLN
0.5000 mg | INTRAMUSCULAR | Status: DC | PRN
Start: 2022-05-27 — End: 2022-05-29
  Administered 2022-05-27 – 2022-05-29 (×13): 0.5 mg via INTRAVENOUS
  Filled 2022-05-27 (×13): qty 0.5

## 2022-05-27 NOTE — Assessment & Plan Note (Deleted)
Stable around Cr 1.4 (baseline Cr ~1.1). Likely prerenal in the setting of decreased PO intake. -Strict I&Os -Avoid nephrotoxic agents -On LR MIVF

## 2022-05-27 NOTE — Progress Notes (Signed)
FMTS Brief Progress Note  S:Examined patient with Dr. Ruben Im. Endorses some abdominal pain, improved since he arrived. Denies nausea or emesis. Denies any questions or concerns.    O: BP (!) 151/80 (BP Location: Right Arm)   Pulse 87   Temp 98.8 F (37.1 C) (Oral)   Resp 19   Ht '5\' 10"'$  (1.778 m)   Wt 73 kg   SpO2 97%   BMI 23.09 kg/m   General: alert, ng tube in place, NAD CV: RRR no murmurs Resp: CTAB normal WOB on RA  A/P: SBO Discussed with RN to put NG tube on suction. Gen surg following - npo - continue NG tube with suction - pain management- sch IV Tylenol, Dialudid 0.'5mg'$  prn for breakthrough pain - LR 160m/hr  -Zofran IV '4mg'$  q6h prn - Orders reviewed. Labs for AM ordered, which was adjusted as needed.   PShary Key DO 05/27/2022, 8:28 PM PGY-3, [facility] Night Resident  Please page [phone] questions.

## 2022-05-27 NOTE — Progress Notes (Signed)
    SUBJECTIVE:   CHIEF COMPLAINT / HPI:   Abdominal pain Mr. Pen is a 81 year old man who presents today with his wife out of concern for abdominal pain.  Started acutely Monday night and worsening since.  Symptoms include abdominal pain, chills, and nausea with associated fever until it passes.  Decreased appetite, but is keeping down water.  Last true BM Sunday night, was normal for him.  He reports small amount of ball shaped stool yesterday, nonpainful.  Denies history of prodromal illnesses, denies pencil thin stools.  No blood in stool or urine.  No vomiting.  Last ate Monday before pain started.  He did go hunting Monday, but brought in his own water from home.  No identifiable environmental or infectious exposures.  Denies new foods, uncooked meats, drinking from streams or lakes, hiking, or camping recently.  No recent travel.  Wife was sick 2 weeks ago with flulike URI symptoms, he does not share any of her same symptoms.  Chart does indicate some weight loss weight loss - 168 lb on 02/12/22 to 161.2 lb today, 12/13.  He denies trying to lose weight on purpose.  No new dietary habits, he reports actually eating more than normal.   Chart notes indicate next colonoscopy due in 2023. I cannot see previous results in Epic, no prior GI notes in Epic or Care Everywhere.  Wife verbally reports that last colonoscopy in approximately 2013 was reported to them as normal.  PERTINENT  PMH / PSH: HTN, BPH, right knee OA, HLD, T2DM, primary OA left hip  OBJECTIVE:   BP 124/72   Pulse 88   Wt 161 lb 3.2 oz (73.1 kg)   SpO2 96%   BMI 23.13 kg/m    General: Awake, alert, moderate distress, uncomfortable appearing Cardiac: Regular rate and rhythm, no murmurs Respiratory: CTAB of anterior fields Abdomen: Mildly distended, hyperactive bowel sounds in all quadrants, moderate to severe TTP in all quadrants, no rebound tenderness elicited when laying flat, however Dr. McDiarmid also examined  and was able to elicit given tenderness with patient sitting up  ASSESSMENT/PLAN:   Generalized abdominal pain Acute onset.  Today with moderate to severe TTP in all quadrants and rebound tenderness.  Vital signs stable.  Given severe new onset pain that is worsening since Monday, will send to ED for urgent imaging of abdomen.  Called his triage nurse to give report, notified family medicine inpatient team.     Ezequiel Essex, MD [place]

## 2022-05-27 NOTE — ED Provider Triage Note (Signed)
Emergency Medicine Provider Triage Evaluation Note  Edward Newton , a 81 y.o. male  was evaluated in triage.  Pt complains of abdominal pain ongoing since Monday.  States that it is constant and a 9/10.  He went to his PCP office and referred to the emergency room.  Family member at bedside states patient has history of bowel obstruction.  Denies history of abdominal surgeries.  Was able to tolerate some fluids this morning otherwise last p.o. intake was last night.  Review of Systems  Positive: As above Negative: As above  Physical Exam  BP 133/81 (BP Location: Right Arm)   Pulse 89   Temp 98.2 F (36.8 C) (Oral)   Resp 18   Ht '5\' 10"'$  (1.778 m)   Wt 73 kg   SpO2 97%   BMI 23.09 kg/m  Gen:   Awake, no distress   Resp:  Normal effort  MSK:   Moves extremities without difficulty  Other:  Generalized abdominal tenderness with guarding  Medical Decision Making  Medically screening exam initiated at 10:07 AM.  Appropriate orders placed.  Karey Stucki was informed that the remainder of the evaluation will be completed by another provider, this initial triage assessment does not replace that evaluation, and the importance of remaining in the ED until their evaluation is complete.     Evlyn Courier, PA-C 05/27/22 1008

## 2022-05-27 NOTE — Patient Instructions (Signed)
It was wonderful to see you today. Thank you for allowing me to be a part of your care. Below is a short summary of what we discussed at your visit today:  Stomach pain Because of the amount of pain you are in and your physical exam, I believe you need to be evaluated in the emergency room. They can perform imaging of your belly quickly.    Please bring all of your medications to every appointment!  If you have any questions or concerns, please do not hesitate to contact us via phone or MyChart message.   Ezequiel Essex, MD

## 2022-05-27 NOTE — Assessment & Plan Note (Signed)
>>  ASSESSMENT AND PLAN FOR S/P SMALL BOWEL RESECTION WRITTEN ON 06/09/2022  8:31 AM BY DAHBURA, ANTON, DO  Tolerating carb modified diet.  Is infrequently needed Dilaudid  for pain.  -General surgery following, appreciate recs -Advance to regular diet per surgery -Continue CTX and Flagyl  for wound infection (day 4/5) -Pain management: Tylenol  650 mg every 6 hours as needed, discontinue Dilaudid  -PT consulted, ambulate as tolerated

## 2022-05-27 NOTE — Progress Notes (Signed)
Pt placed back on suction

## 2022-05-27 NOTE — ED Notes (Signed)
Patient walked in with cookout food and drinks. Will continue to monitor

## 2022-05-27 NOTE — Assessment & Plan Note (Addendum)
Tolerating carb modified diet.  Is infrequently needed Dilaudid for pain.  -General surgery following, appreciate recs -Advance to regular diet per surgery -Continue CTX and Flagyl for wound infection (day 4/5) -Pain management: Tylenol 650 mg every 6 hours as needed, discontinue Dilaudid -PT consulted, ambulate as tolerated

## 2022-05-27 NOTE — Assessment & Plan Note (Deleted)
Plts 137, stable -Lovenox for DVT prophylaxis

## 2022-05-27 NOTE — ED Triage Notes (Signed)
Pt endorses generalized abd pain since Monday with nausea.
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
## 2022-05-27 NOTE — H&P (Cosign Needed Addendum)
Hospital Admission History and Physical Service Pager: [phone]  Patient name: Edward Newton Medical record number: [id number] Date of Birth: Jan 07, 1941 Age: 81 y.o. Gender: male  Primary Care Provider: Rise Patience, DO Consultants: General surgery Code Status: Full code   Preferred Emergency Contact: Sallie - spouse Contact Information     Name Relation Home Work [place] R Spouse [phone]  [phone]   Bradly Bienenstock Daughter [phone]         Chief Complaint: Abdominal pain  Assessment and Plan: Vinal Rosengrant is a 81 y.o. male presenting with abdominal pain and nausea x 2 days . Differential for this patient's presentation of this includes SBO (confirmed by CT) and less likely could be due to pancreatitis (normal lipase, not seen on CT), cystitis (normal UA), and acute cholecystitis, appendicitis (non-localized pain, not seen on CT).  * Small bowel obstruction (HCC) Abdominal pain x 2 days associated with nausea. CT confirmed high grade SBO with some adhesions and free fluid in the pelvis. S/p 1L LR bolus in ED. Expect improvement in 2-5 days. No signs of perforation (free air) at this time. -Admitted to FMTS service, Dr. Owens Shark attending, med-surg -Consulted general surgery, appreciate recs -NPO -NG tube with suction -Pain management: Sch Tylenol IV, Dilaudid 0.'5mg'$  prn breakthrough pain -LR MIVF -Zofran IV '4mg'$  q6h prn  Acute kidney injury ([place]) Cr 1.5 upon admission (baseline Cr ~1.1). Likely prerenal in the setting of decreased PO intake. -Strict I&Os -Avoid nephrotoxic agents -On LR MIVF -AM BMP  Thrombocytopenia (HCC) Platelets 58 upon admission accompanied with mild leukopenia at 3.6. Physical exam unremarkable. Per chart review no prior history platelets < 100K. No history of liver disease or specific medication that may explain mostly isolated thrombocytopenia.  -SCD for DVT prophylaxis -Recheck CBC in AM -Consider peripheral  smear if persistent  Chronic conditions: BPH: hold Flomax due to NPO HLD: hold Lipitor due to NPO HTN: hold home HCTZ given AKI  FEN/GI: NPO with NG VTE Prophylaxis: SCDs (due to thrombocytopenia)  Disposition: Med surg  History of Present Illness:  Edward Newton is a 81 y.o. male presenting with abdominal pain and nausea x 2 days (started 12/11 PM). States he last had a BM on Sunday and it was non-bloody, small pellets. Since that time denies additional BM and states he has not been passing gas. States he feels nauseous intermittently and like he "should vomit" but has not vomited. Last ate yesterday lunchtime but couldn't eat more than a few bites due to pain. Endorses umbilical hernia repair around 25 years ago and "cut to stomach" when he got hit by something. Denies blood in bowel movements, urinary habits normal.   Patient states he had a similar experience about 30 years ago and his stomach was "pumped" for about 6 days. Denies having surgery at that time but states the bowel "busted".    Currently patient rates pain 6/10, came in with 9/10. States he is having nausea. Had a couple sips of soda family brought in but understands now NPO.  In the ED, CT imaging confirmed SBO with free pelvic fluid. Patient given 1L LR bolus and started on MIVF. Made NPO and NG placed. Pain management with Dilaudid and Fentanyl.  Review Of Systems: Per HPI with the following additions: abdominal pain  Pertinent Past Medical History: HTN BPH Right knee OA HLD T2DM Primary OA left hip  Remainder reviewed in history tab.   Pertinent Past Surgical History: Hernia repair ~25 years ago Prostate  resection (2016) Remainder reviewed in history tab.   Pertinent Social History: Tobacco use: Former, quit 20+ years ago Alcohol use: None, has not drank for years Other Substance use: history of marijuana but nothing in the last 25 years Lives with wife  Pertinent Family History: Mother: HTN,  stroke Sister: HTN, heart disease Remainder reviewed in history tab.   Important Outpatient Medications: Aspirin '81mg'$  - denies taking Lipitor '40mg'$  HCTZ 12.'5mg'$  Flomax 0.'4mg'$  Remainder reviewed in medication history.   Objective: BP (!) 151/80 (BP Location: Right Arm)   Pulse 87   Temp 98.8 F (37.1 C) (Oral)   Resp 19   Ht '5\' 10"'$  (1.778 m)   Wt 73 kg   SpO2 97%   BMI 23.09 kg/m  Exam: General: Tense, appears in pain. Alert Eyes: White sclera ENTM: Moist mucus membranes Neck: Supple Cardiovascular: RRR without murmur Respiratory: CTAB. Normal WOB on RA. Gastrointestinal: Diffusely tender to light palpation. Hypoactive bowel sounds. Soft, non-distended. MSK: Well perfused. No peripheral edema Derm: Warm, clammy. No rashes Neuro: CN intact. Motor and sensation intact globally Psych: Cooperative, pleasant  Labs:  CBC BMET  Recent Labs  Lab 05/27/22 1009 05/27/22 1031  WBC 3.6*  --   HGB 15.2 15.6  HCT 45.3 46.0  PLT 58*  --    Recent Labs  Lab 05/27/22 1009 05/27/22 1031  NA 143 142  K 3.9 4.0  CL 105 105  CO2 26  --   BUN 21 22  CREATININE 1.49* 1.50*  GLUCOSE 167* 168*  CALCIUM 9.4  --      Pertinent additional labs: Lipase: 23 UA: ketones 20  Imaging Studies Performed: CT ABDOMEN PELVIS W CONTRAST  Result Date: 05/27/2022 IMPRESSION:  1. CT findings consistent with high-grade small-bowel obstruction with transition to normal/decompressed mid distal ileal loops in the right lower quadrant. No obvious obstructing mass. Possible adhesions.  2. Small amount of free abdominal/free pelvic fluid likely related to the small-bowel obstruction.  3. Indeterminate right hepatic lobe lesion, right renal lesion and right adrenal lesion. Recommend follow-up MRI abdomen without and with contrast when the patient is stable and the small-bowel obstruction has resolved.  4. Markedly enlarged prostate gland.  5. Advanced atherosclerotic calcifications involving the  aorta and branch vessels. Aortic Atherosclerosis (ICD10-I70.0).   [name], MD 05/27/2022, 6:05 PM PGY-1, [place] Intern pager: [phone], text pages welcome Secure chat group [place]   I was personally present and performed or re-performed the history, physical exam and medical decision making activities of this service and have verified that the service and findings are accurately documented in the intern's note.  Wells Guiles, DO                  05/27/2022, 8:14 PM

## 2022-05-27 NOTE — Assessment & Plan Note (Signed)
Acute onset.  Today with moderate to severe TTP in all quadrants and rebound tenderness.  Vital signs stable.  Given severe new onset pain that is worsening since Monday, will send to ED for urgent imaging of abdomen.  Called his triage nurse to give report, notified family medicine inpatient team.

## 2022-05-27 NOTE — ED Provider Notes (Signed)
[facility] EMERGENCY DEPARTMENT Provider Note   CSN: [id number] Arrival date & time: 05/27/22  4098     History  Chief Complaint  Patient presents with   Abdominal Pain    Edward Newton is a 81 y.o. male.   Abdominal Pain Associated symptoms: constipation and nausea   Patient presents for abdominal pain.  Medical history includes HLD, BPH, HTN, T2DM, arthritis. He reports a remote history of small bowel obstruction that was approximately 30 years ago.  He did not need surgery at that time.  He underwent bowel decompression and medical management.  Currently, abdominal pain has been present for the past 2 days.  It is generalized.  It has been constant.  His last bowel movement was 3 days ago.  He reports obstipation.  He has a decreased appetite.  He has not had vomiting.  He does feel like his abdomen is distended.  Currently, pain is 9/10 in severity.       Home Medications Prior to Admission medications   Medication Sig Start Date End Date Taking? Authorizing Provider  aspirin EC 81 MG tablet Take 81 mg by mouth daily. Patient not taking: Reported on 12/26/2020    [provider]  atorvastatin (LIPITOR) 40 MG tablet TAKE 1 TABLET BY MOUTH  DAILY 12/18/21   Lilland, Alana, DO  hydrochlorothiazide (HYDRODIURIL) 12.5 MG tablet TAKE 1 TABLET BY MOUTH DAILY 12/18/21   Lilland, Alana, DO  meloxicam (MOBIC) 15 MG tablet Take 1 tablet (15 mg total) by mouth daily. Take 1 tablet by mouth daily. Please eat something before taking the medication. 02/12/22   Lowry Ram, MD  sodium-potassium bicarbonate (ALKA-SELTZER GOLD) TBEF dissolvable tablet Take 1 tablet by mouth daily as needed.    [provider]  tamsulosin (FLOMAX) 0.4 MG CAPS capsule Take 0.4 mg by mouth daily. 07/02/20   [provider]  triamcinolone ointment (KENALOG) 0.1 % Apply 1 application topically 2 (two) times daily. 09/04/19   Shirley, [place], DO      Allergies     Lisinopril    Review of Systems   Review of Systems  Constitutional:  Positive for appetite change.  Gastrointestinal:  Positive for abdominal distention, abdominal pain, constipation and nausea.  All other systems reviewed and are negative.   Physical Exam Updated Vital Signs BP 130/69   Pulse 87   Temp 98.5 F (36.9 C) (Oral)   Resp 16   Ht '5\' 10"'$  (1.778 m)   Wt 73 kg   SpO2 96%   BMI 23.09 kg/m  Physical Exam Vitals and nursing note reviewed.  Constitutional:      General: He is not in acute distress.    Appearance: He is well-developed. He is not ill-appearing, toxic-appearing or diaphoretic.  HENT:     Head: Normocephalic and atraumatic.  Eyes:     General: No scleral icterus.    Extraocular Movements: Extraocular movements intact.     Conjunctiva/sclera: Conjunctivae normal.  Cardiovascular:     Rate and Rhythm: Normal rate and regular rhythm.  Pulmonary:     Effort: Pulmonary effort is normal. No respiratory distress.  Abdominal:     General: There is distension.     Palpations: Abdomen is soft.     Tenderness: There is generalized abdominal tenderness. There is no guarding or rebound.  Musculoskeletal:        General: No swelling.     Cervical back: Neck supple.  Skin:    General: Skin is warm  and dry.     Capillary Refill: Capillary refill takes less than 2 seconds.     Coloration: Skin is not jaundiced or pale.  Neurological:     General: No focal deficit present.     Mental Status: He is alert and oriented to person, place, and time.  Psychiatric:        Mood and Affect: Mood normal.        Behavior: Behavior normal.     ED Results / Procedures / Treatments   Labs (all labs ordered are listed, but only abnormal results are displayed) Labs Reviewed  CBC WITH DIFFERENTIAL/PLATELET - Abnormal; Notable for the following components:      Result Value   WBC 3.6 (*)    Platelets 58 (*)    All other components within normal limits  URINALYSIS,  ROUTINE W REFLEX MICROSCOPIC - Abnormal; Notable for the following components:   Ketones, ur 20 (*)    All other components within normal limits  COMPREHENSIVE METABOLIC PANEL - Abnormal; Notable for the following components:   Glucose, Bld 167 (*)    Creatinine, Ser 1.49 (*)    Total Bilirubin 1.4 (*)    GFR, Estimated 47 (*)    All other components within normal limits  I-STAT CHEM 8, ED - Abnormal; Notable for the following components:   Creatinine, Ser 1.50 (*)    Glucose, Bld 168 (*)    Calcium, Ion 1.12 (*)    All other components within normal limits  LIPASE, BLOOD    EKG None  Radiology CT ABDOMEN PELVIS W CONTRAST  Result Date: 05/27/2022 CLINICAL DATA:  Abdominal pain for 3 days. EXAM: CT ABDOMEN AND PELVIS WITH CONTRAST TECHNIQUE: Multidetector CT imaging of the abdomen and pelvis was performed using the standard protocol following bolus administration of intravenous contrast. RADIATION DOSE REDUCTION: This exam was performed according to the departmental dose-optimization program which includes automated exposure control, adjustment of the mA and/or kV according to patient size and/or use of iterative reconstruction technique. CONTRAST:  73m OMNIPAQUE IOHEXOL 350 MG/ML SOLN COMPARISON:  PET-CT from 2006. FINDINGS: Lower chest: The lung bases are clear of acute process. No pleural effusion or pulmonary lesions. Mild lower lobe bronchiectasis. The heart is normal in size. No pericardial effusion. The distal esophagus and aorta are unremarkable. Hepatobiliary: Indeterminate 19 mm lesion in segment 6 posteriorly. No intrahepatic biliary dilatation. The gallbladder is unremarkable. No common bile duct dilatation. Pancreas: No mass, inflammation or ductal dilatation. There is moderate atrophy of the pancreatic body and tail. Spleen: Normal size.  No focal lesions. Adrenals/Urinary Tract: 16 mm right adrenal gland nodule is indeterminate. The left adrenal gland is normal. 10 mm lower  pole right renal lesion posteriorly could be a hemorrhagic cyst or small enhancing renal lesion. The bladder is unremarkable. Stomach/Bowel: The stomach is slightly distended with fluid and probable fluid in the body region. The duodenum is unremarkable. There are significantly dilated small bowel loops with air-fluid levels suggesting high-grade small-bowel obstruction. There appears to be a transition to normal/decompressed mid distal ileal loops of small bowel in the right lower quadrant. No obvious obstructing mass. Possible adhesions. The colon is decompressed and demonstrates severe diffuse diverticulosis. Vascular/Lymphatic: Advanced atherosclerotic calcifications involving the aorta and branch vessels but no aneurysm or dissection. The major venous structures are patent. No mesenteric or retroperitoneal mass or adenopathy. Reproductive: Markedly enlarged prostate gland. The seminal vesicles are grossly normal. Other: Small amount of free abdominal/free pelvic fluid likely related to  the small-bowel obstruction. No anterior abdominal wall hernia or inguinal hernia. Musculoskeletal: No significant bony findings. IMPRESSION: 1. CT findings consistent with high-grade small-bowel obstruction with transition to normal/decompressed mid distal ileal loops in the right lower quadrant. No obvious obstructing mass. Possible adhesions. 2. Small amount of free abdominal/free pelvic fluid likely related to the small-bowel obstruction. 3. Indeterminate right hepatic lobe lesion, right renal lesion and right adrenal lesion. Recommend follow-up MRI abdomen without and with contrast when the patient is stable and the small-bowel obstruction has resolved. 4. Markedly enlarged prostate gland. 5. Advanced atherosclerotic calcifications involving the aorta and branch vessels. Aortic Atherosclerosis (ICD10-I70.0). Electronically Signed   By: Marijo Sanes M.D.   On: 05/27/2022 11:28    Procedures Procedures    Medications  Ordered in ED Medications  lactated ringers bolus 1,000 mL (has no administration in time range)  HYDROmorphone (DILAUDID) injection 0.5 mg (has no administration in time range)  lactated ringers infusion (has no administration in time range)  ondansetron (ZOFRAN) injection 4 mg (4 mg Intravenous Given 05/27/22 1010)  fentaNYL (SUBLIMAZE) injection 50 mcg (50 mcg Intravenous Given 05/27/22 1010)  iohexol (OMNIPAQUE) 350 MG/ML injection 65 mL (65 mLs Intravenous Contrast Given 05/27/22 1109)    ED Course/ Medical Decision Making/ A&P                           Medical Decision Making  This patient presents to the ED for concern of abdominal pain, this involves an extensive number of treatment options, and is a complaint that carries with it a high risk of complications and morbidity.  The differential diagnosis includes constipation, SBO, neoplasm, colitis, pancreatitis, cholecystitis   Co morbidities that complicate the patient evaluation  HLD, BPH, HTN, T2DM, arthritis   Additional history obtained:  Additional history obtained from N/A External records from outside source obtained and reviewed including EMR   Lab Tests:  I Ordered, and personally interpreted labs.  The pertinent results include: AKI is present.  Results otherwise unremarkable.   Imaging Studies ordered:  I ordered imaging studies including CT abdomen pelvis I independently visualized and interpreted imaging which showed high-grade SBO I agree with the radiologist interpretation   Cardiac Monitoring: / EKG:  The patient was maintained on a cardiac monitor.  I personally viewed and interpreted the cardiac monitored which showed an underlying rhythm of: Sinus rhythm   Problem List / ED Course / Critical interventions / Medication management  Patient presents for abdominal pain, distention, constipation, and obstipation.  Symptoms have been present for the past 2 days.  Prior to being bedded in the ED,  diagnostic workup was initiated.  Lab work is notable for AKI.  CT imaging shows high-grade small bowel obstruction with right lower quadrant transition point and no obvious obstructing mass.  On assessment, patient is well-appearing.  Vital signs are normal.  He does have abdominal tension with generalized tenderness.  Patient was agreeable to placement of NG tube.  This was ordered.  IV fluids were ordered for hydration and AKI.  Dilaudid was ordered for analgesia.  Patient was admitted to medicine for further management. I ordered medication including IV fluids for hydration; Dilaudid for analgesia Reevaluation of the patient after these medicines showed that the patient improved I have reviewed the patients home medicines and have made adjustments as needed   Social Determinants of Health:  Has PCP         Final Clinical Impression(s) /  ED Diagnoses Final diagnoses:  Small bowel obstruction [facility])    Rx / DC Orders ED Discharge Orders     None         Godfrey Pick, MD 05/27/22 1623

## 2022-05-28 ENCOUNTER — Observation Stay (HOSPITAL_COMMUNITY): Payer: Medicare Other

## 2022-05-28 ENCOUNTER — Inpatient Hospital Stay (HOSPITAL_COMMUNITY): Payer: Medicare Other

## 2022-05-28 ENCOUNTER — Encounter (HOSPITAL_COMMUNITY): Payer: Self-pay | Admitting: Family Medicine

## 2022-05-28 DIAGNOSIS — E785 Hyperlipidemia, unspecified: Secondary | ICD-10-CM | POA: Diagnosis present

## 2022-05-28 DIAGNOSIS — R109 Unspecified abdominal pain: Secondary | ICD-10-CM | POA: Diagnosis not present

## 2022-05-28 DIAGNOSIS — K5669 Other partial intestinal obstruction: Secondary | ICD-10-CM | POA: Diagnosis not present

## 2022-05-28 DIAGNOSIS — R1084 Generalized abdominal pain: Secondary | ICD-10-CM | POA: Diagnosis present

## 2022-05-28 DIAGNOSIS — K5939 Other megacolon: Secondary | ICD-10-CM | POA: Diagnosis not present

## 2022-05-28 DIAGNOSIS — K5651 Intestinal adhesions [bands], with partial obstruction: Secondary | ICD-10-CM | POA: Diagnosis not present

## 2022-05-28 DIAGNOSIS — M1612 Unilateral primary osteoarthritis, left hip: Secondary | ICD-10-CM | POA: Diagnosis present

## 2022-05-28 DIAGNOSIS — E871 Hypo-osmolality and hyponatremia: Secondary | ICD-10-CM | POA: Diagnosis not present

## 2022-05-28 DIAGNOSIS — K529 Noninfective gastroenteritis and colitis, unspecified: Secondary | ICD-10-CM | POA: Diagnosis not present

## 2022-05-28 DIAGNOSIS — N289 Disorder of kidney and ureter, unspecified: Secondary | ICD-10-CM | POA: Diagnosis present

## 2022-05-28 DIAGNOSIS — K567 Ileus, unspecified: Secondary | ICD-10-CM | POA: Diagnosis not present

## 2022-05-28 DIAGNOSIS — E119 Type 2 diabetes mellitus without complications: Secondary | ICD-10-CM | POA: Diagnosis not present

## 2022-05-28 DIAGNOSIS — Z8249 Family history of ischemic heart disease and other diseases of the circulatory system: Secondary | ICD-10-CM | POA: Diagnosis not present

## 2022-05-28 DIAGNOSIS — Y733 Surgical instruments, materials and gastroenterology and urology devices (including sutures) associated with adverse incidents: Secondary | ICD-10-CM | POA: Diagnosis not present

## 2022-05-28 DIAGNOSIS — T8141XA Infection following a procedure, superficial incisional surgical site, initial encounter: Secondary | ICD-10-CM | POA: Diagnosis not present

## 2022-05-28 DIAGNOSIS — D696 Thrombocytopenia, unspecified: Secondary | ICD-10-CM | POA: Diagnosis present

## 2022-05-28 DIAGNOSIS — I4510 Unspecified right bundle-branch block: Secondary | ICD-10-CM | POA: Diagnosis present

## 2022-05-28 DIAGNOSIS — E870 Hyperosmolality and hypernatremia: Secondary | ICD-10-CM | POA: Diagnosis not present

## 2022-05-28 DIAGNOSIS — G4734 Idiopathic sleep related nonobstructive alveolar hypoventilation: Secondary | ICD-10-CM | POA: Diagnosis not present

## 2022-05-28 DIAGNOSIS — I10 Essential (primary) hypertension: Secondary | ICD-10-CM | POA: Diagnosis present

## 2022-05-28 DIAGNOSIS — N2889 Other specified disorders of kidney and ureter: Secondary | ICD-10-CM | POA: Diagnosis not present

## 2022-05-28 DIAGNOSIS — E279 Disorder of adrenal gland, unspecified: Secondary | ICD-10-CM | POA: Diagnosis present

## 2022-05-28 DIAGNOSIS — N179 Acute kidney failure, unspecified: Secondary | ICD-10-CM | POA: Diagnosis present

## 2022-05-28 DIAGNOSIS — K3189 Other diseases of stomach and duodenum: Secondary | ICD-10-CM | POA: Diagnosis not present

## 2022-05-28 DIAGNOSIS — K56609 Unspecified intestinal obstruction, unspecified as to partial versus complete obstruction: Secondary | ICD-10-CM | POA: Diagnosis not present

## 2022-05-28 DIAGNOSIS — R059 Cough, unspecified: Secondary | ICD-10-CM | POA: Diagnosis not present

## 2022-05-28 DIAGNOSIS — K565 Intestinal adhesions [bands], unspecified as to partial versus complete obstruction: Secondary | ICD-10-CM | POA: Diagnosis present

## 2022-05-28 DIAGNOSIS — J9811 Atelectasis: Secondary | ICD-10-CM | POA: Diagnosis not present

## 2022-05-28 DIAGNOSIS — K6389 Other specified diseases of intestine: Secondary | ICD-10-CM | POA: Diagnosis not present

## 2022-05-28 DIAGNOSIS — Z87891 Personal history of nicotine dependence: Secondary | ICD-10-CM | POA: Diagnosis not present

## 2022-05-28 DIAGNOSIS — Z9049 Acquired absence of other specified parts of digestive tract: Secondary | ICD-10-CM | POA: Diagnosis not present

## 2022-05-28 DIAGNOSIS — Z4682 Encounter for fitting and adjustment of non-vascular catheter: Secondary | ICD-10-CM | POA: Diagnosis not present

## 2022-05-28 DIAGNOSIS — N400 Enlarged prostate without lower urinary tract symptoms: Secondary | ICD-10-CM | POA: Diagnosis present

## 2022-05-28 DIAGNOSIS — R0902 Hypoxemia: Secondary | ICD-10-CM | POA: Diagnosis not present

## 2022-05-28 DIAGNOSIS — E1165 Type 2 diabetes mellitus with hyperglycemia: Secondary | ICD-10-CM | POA: Diagnosis not present

## 2022-05-28 DIAGNOSIS — Z9079 Acquired absence of other genital organ(s): Secondary | ICD-10-CM | POA: Diagnosis not present

## 2022-05-28 DIAGNOSIS — K769 Liver disease, unspecified: Secondary | ICD-10-CM | POA: Diagnosis present

## 2022-05-28 DIAGNOSIS — M1711 Unilateral primary osteoarthritis, right knee: Secondary | ICD-10-CM | POA: Diagnosis present

## 2022-05-28 DIAGNOSIS — R0982 Postnasal drip: Secondary | ICD-10-CM | POA: Diagnosis not present

## 2022-05-28 LAB — BASIC METABOLIC PANEL
Anion gap: 9 (ref 5–15)
BUN: 21 mg/dL (ref 8–23)
CO2: 29 mmol/L (ref 22–32)
Calcium: 8.8 mg/dL — ABNORMAL LOW (ref 8.9–10.3)
Chloride: 107 mmol/L (ref 98–111)
Creatinine, Ser: 1.41 mg/dL — ABNORMAL HIGH (ref 0.61–1.24)
GFR, Estimated: 51 mL/min — ABNORMAL LOW (ref >60)
Glucose, Bld: 143 mg/dL — ABNORMAL HIGH (ref 70–99)
Potassium: 4 mmol/L (ref 3.5–5.1)
Sodium: 145 mmol/L (ref 135–145)

## 2022-05-28 LAB — CBC
HCT: 42.1 % (ref 39.0–52.0)
Hemoglobin: 14 g/dL (ref 13.0–17.0)
MCH: 28.9 pg (ref 26.0–34.0)
MCHC: 33.3 g/dL (ref 30.0–36.0)
MCV: 86.8 fL (ref 80.0–100.0)
Platelets: 111 10*3/uL — ABNORMAL LOW (ref 150–400)
RBC: 4.85 MIL/uL (ref 4.22–5.81)
RDW: 15.1 % (ref 11.5–15.5)
WBC: 2.9 10*3/uL — ABNORMAL LOW (ref 4.0–10.5)
nRBC: 0 % (ref 0.0–0.2)

## 2022-05-28 LAB — HEPATIC FUNCTION PANEL
ALT: 15 U/L (ref 0–44)
AST: 17 U/L (ref 15–41)
Albumin: 3.2 g/dL — ABNORMAL LOW (ref 3.5–5.0)
Alkaline Phosphatase: 52 U/L (ref 38–126)
Bilirubin, Direct: 0.2 mg/dL (ref 0.0–0.2)
Indirect Bilirubin: 0.6 mg/dL (ref 0.3–0.9)
Total Bilirubin: 0.8 mg/dL (ref 0.3–1.2)
Total Protein: 6.6 g/dL (ref 6.5–8.1)

## 2022-05-28 MED ORDER — PHENOL 1.4 % MT LIQD
1.0000 | OROMUCOSAL | Status: DC | PRN
  Administered 2022-05-28: 1 via OROMUCOSAL
  Filled 2022-05-28: qty 177

## 2022-05-28 MED ORDER — ENOXAPARIN SODIUM 40 MG/0.4ML IJ SOSY
40.0000 mg | PREFILLED_SYRINGE | Freq: Every day | INTRAMUSCULAR | Status: DC
  Administered 2022-05-28: 40 mg via SUBCUTANEOUS
  Filled 2022-05-28: qty 0.4

## 2022-05-28 MED ORDER — DIATRIZOATE MEGLUMINE & SODIUM 66-10 % PO SOLN
90.0000 mL | Freq: Once | ORAL | Status: AC
  Administered 2022-05-28: 90 mL via NASOGASTRIC
  Filled 2022-05-28 (×2): qty 90

## 2022-05-28 NOTE — Hospital Course (Addendum)
Edward Newton is a 81 y.o.male with a history of T2DM, HTN, and HLD who was admitted to the [facility] Medicine Teaching Service at [facility] for abdominal pain and found to have SBO. His hospital course is detailed below:  S/p ex-lap, small bowel resection with anastomosis 2/2 SBO Presented with abdominal pain x 2 days and found to have SBO without perforation. General surgery consulted and NG was placed. His abdomen became more distended and general surgery opted for ex-lap on 12/15 resulting in small bowel resection due to adherent portion of the bowel. Bowel was anastomosed and patient was continued on NG with suction. Patient with persistent abdominal distension and foul drainage from incision site post-op, general surgery opted for imaging that showed ileus vs additional SBO. Pt was started on CTX and flagyl x 5 days. He was started on TPN on 12/19 and this was discontinued 12/25.  Patient's diet was advanced as tolerated. At discharge, pt was tolerating ***.  AKI Admitted with Cr 1.5 (baseline ~1.1) likely in the setting of poor PO intake. AKI resolved with fluid resuscitation.  Other chronic conditions were medically managed with home medications and formulary alternatives as necessary (BPH, T2DM)  PCP Follow-up Recommendations:  Hepatic lobe and adrenal lesions- will need MRI abdomen without and with contrast for f/u

## 2022-05-28 NOTE — Progress Notes (Signed)
Subjective: CC: Patient reports central abdominal pain that is improved since ngt placement and pain medication. Nausea also improved. NGT output bilious. No flatus or bm. Last bm Sunday, 12/10. Normally has a bm everyday.   Gastrografin given at 2018 yesterday. Not appreciated on today's films. Continued sbo changes. NGT in good position. Afebrile overnight. No tachycardia or hypotension.   Reports hx of similar symptoms 30 years ago that was managed with ngt and resolved. Not sure if they called it an sbo. Reports hx of UHR 25 years ago. Also had surgery on his prostate - looks like this was a TURP with Dr. Alinda Money in 2016.   Objective: Vital signs in last 24 hours: Temp:  [98.2 F (36.8 C)-99.4 F (37.4 C)] 98.2 F (36.8 C) (12/14 0401) Pulse Rate:  [74-89] 74 (12/14 0401) Resp:  [10-19] 19 (12/13 2000) BP: (124-153)/(68-88) 146/73 (12/14 0401) SpO2:  [91 %-97 %] 91 % (12/14 0401) Weight:  [73 kg-73.1 kg] 73 kg (12/13 1000) Last BM Date : 05/24/22 (pta)  Intake/Output from previous day: 12/13 0701 - 12/14 0700 In: 2651.7 [I.V.:1298.5; NG/GT:60; IV Piggyback:1293.1] Out: 900 [Urine:400; Emesis/NG output:500] Intake/Output this shift: No intake/output data recorded.  PE: Gen:  Alert, NAD, pleasant Abd: Mild to moderate distension but very soft. Central and right sided ttp without rigidity or guarding, hypoactive bs, ngt in place with bilious output. Prior umbilical hernia surgical scar well healed. No obvious hernias on exma.   Lab Results:  Recent Labs    05/27/22 1009 05/27/22 1031 05/28/22 0254  WBC 3.6*  --  2.9*  HGB 15.2 15.6 14.0  HCT 45.3 46.0 42.1  PLT 58*  --  111*   BMET Recent Labs    05/27/22 1009 05/27/22 1031 05/28/22 0254  NA 143 142 145  K 3.9 4.0 4.0  CL 105 105 107  CO2 26  --  29  GLUCOSE 167* 168* 143*  BUN '21 22 21  '$ CREATININE 1.49* 1.50* 1.41*  CALCIUM 9.4  --  8.8*   PT/INR No results for input(s): "LABPROT", "INR" in the  last 72 hours. CMP     Component Value Date/Time   NA 145 05/28/2022 0254   NA 140 06/25/2021 0945   K 4.0 05/28/2022 0254   CL 107 05/28/2022 0254   CO2 29 05/28/2022 0254   GLUCOSE 143 (H) 05/28/2022 0254   BUN 21 05/28/2022 0254   BUN 16 06/25/2021 0945   CREATININE 1.41 (H) 05/28/2022 0254   CREATININE 1.27 (H) 05/14/2016 1534   CALCIUM 8.8 (L) 05/28/2022 0254   PROT 7.6 05/27/2022 1009   PROT 6.8 02/02/2020 1017   ALBUMIN 3.9 05/27/2022 1009   ALBUMIN 4.1 02/02/2020 1017   AST 20 05/27/2022 1009   ALT 18 05/27/2022 1009   ALKPHOS 64 05/27/2022 1009   BILITOT 1.4 (H) 05/27/2022 1009   BILITOT 0.7 02/02/2020 1017   GFRNONAA 51 (L) 05/28/2022 0254   GFRNONAA 56 (L) 05/14/2016 1534   GFRAA 58 (L) 02/02/2020 1017   GFRAA 64 05/14/2016 1534   Lipase     Component Value Date/Time   LIPASE 23 05/27/2022 1009    Studies/Results: DG Abd Portable 1V-Small Bowel Obstruction Protocol-initial, 8 hr delay  Result Date: 05/28/2022 CLINICAL DATA:  Encounter for small bowel obstruction EXAM: PORTABLE ABDOMEN - 1 VIEW COMPARISON:  CT from yesterday FINDINGS: Continued gas dilated small bowel. The degree of distension is similar to before. Sparing of gas in the left lower  quadrant without explanation by prior CT. An enteric tube tip and side port at the stomach where there is minimal high-density internal material. No concerning mass effect or gas collection. 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 IMPRESSION: Ongoing small bowel obstruction. Located enteric tube Electronically Signed   By: Jorje Guild M.D.   On: 05/28/2022 06:13   DG Abd Portable 1 View  Result Date: 05/27/2022 CLINICAL DATA:  Nasogastric tube placement. EXAM: PORTABLE ABDOMEN - 1 VIEW COMPARISON:  None Available. FINDINGS: Distal tip of nasogastric tube is seen in expected position of proximal stomach. Dilated small bowel loops are noted. IMPRESSION: Distal tip of nasogastric tube is seen in expected position of  proximal stomach. Small bowel dilatation is noted. Electronically Signed   By: Marijo Conception M.D.   On: 05/27/2022 17:20   CT ABDOMEN PELVIS W CONTRAST  Result Date: 05/27/2022 CLINICAL DATA:  Abdominal pain for 3 days. EXAM: CT ABDOMEN AND PELVIS WITH CONTRAST TECHNIQUE: Multidetector CT imaging of the abdomen and pelvis was performed using the standard protocol following bolus administration of intravenous contrast. RADIATION DOSE REDUCTION: This exam was performed according to the departmental dose-optimization program which includes automated exposure control, adjustment of the mA and/or kV according to patient size and/or use of iterative reconstruction technique. CONTRAST:  81m OMNIPAQUE IOHEXOL 350 MG/ML SOLN COMPARISON:  PET-CT from 2006. FINDINGS: Lower chest: The lung bases are clear of acute process. No pleural effusion or pulmonary lesions. Mild lower lobe bronchiectasis. The heart is normal in size. No pericardial effusion. The distal esophagus and aorta are unremarkable. Hepatobiliary: Indeterminate 19 mm lesion in segment 6 posteriorly. No intrahepatic biliary dilatation. The gallbladder is unremarkable. No common bile duct dilatation. Pancreas: No mass, inflammation or ductal dilatation. There is moderate atrophy of the pancreatic body and tail. Spleen: Normal size.  No focal lesions. Adrenals/Urinary Tract: 16 mm right adrenal gland nodule is indeterminate. The left adrenal gland is normal. 10 mm lower pole right renal lesion posteriorly could be a hemorrhagic cyst or small enhancing renal lesion. The bladder is unremarkable. Stomach/Bowel: The stomach is slightly distended with fluid and probable fluid in the body region. The duodenum is unremarkable. There are significantly dilated small bowel loops with air-fluid levels suggesting high-grade small-bowel obstruction. There appears to be a transition to normal/decompressed mid distal ileal loops of small bowel in the right lower quadrant.  No obvious obstructing mass. Possible adhesions. The colon is decompressed and demonstrates severe diffuse diverticulosis. Vascular/Lymphatic: Advanced atherosclerotic calcifications involving the aorta and branch vessels but no aneurysm or dissection. The major venous structures are patent. No mesenteric or retroperitoneal mass or adenopathy. Reproductive: Markedly enlarged prostate gland. The seminal vesicles are grossly normal. Other: Small amount of free abdominal/free pelvic fluid likely related to the small-bowel obstruction. No anterior abdominal wall hernia or inguinal hernia. Musculoskeletal: No significant bony findings. IMPRESSION: 1. CT findings consistent with high-grade small-bowel obstruction with transition to normal/decompressed mid distal ileal loops in the right lower quadrant. No obvious obstructing mass. Possible adhesions. 2. Small amount of free abdominal/free pelvic fluid likely related to the small-bowel obstruction. 3. Indeterminate right hepatic lobe lesion, right renal lesion and right adrenal lesion. Recommend follow-up MRI abdomen without and with contrast when the patient is stable and the small-bowel obstruction has resolved. 4. Markedly enlarged prostate gland. 5. Advanced atherosclerotic calcifications involving the aorta and branch vessels. Aortic Atherosclerosis (ICD10-I70.0). Electronically Signed   By: PMarijo SanesM.D.   On: 05/27/2022 11:28    Anti-infectives:  Anti-infectives (From admission, onward)    None        Assessment/Plan SBO - CT w/ sbo with small amount of free fluid in the pelvis. Hx UHR ~25 years ago. Also sounds like he may have had sbo ~30 years ago that resolved w/ conservative management.  - Xray this morning with dilated small bowel c/w continued sbo. No contrast visualized on imaging. Will repeat sbo protocol.  - Keep NPO and cont NGT to LIWS - Keep K > 4 and Mg > 2 for bowel function - Mobilize for bowel function - No current indication  for emergency surgery. Hopefully patient will improve with conservative management. If patient fails to improve with conservative management, they may require exploratory surgery during admission -We will follow with you.  FEN - NPO, NGT, IVF per TRH VTE - SCDs, okay for chem ppx from our standpoint ID - None. Afebrile. No tachycardia or hypotension. Leukopenic w/ wbc 2.9.   - Per TRH -  HTN HLD DM2 Remote hx parotid cancer (2004)  Incidential findings - Indeterminate right hepatic lobe lesion, right renal lesion and right adrenal lesion. Radiology recommending MRI w/ and w/o contrast to f/u this after sbo has resolved.    LOS: 0 days    Jillyn Ledger , [facility] 05/28/2022, 8:16 AM Please see Amion for pager number during day hours 7:00am-4:30pm

## 2022-05-28 NOTE — Progress Notes (Signed)
Initial Nutrition Assessment  DOCUMENTATION CODES:   Not applicable  INTERVENTION:  - Advance diet as tolerated.   NUTRITION DIAGNOSIS:   Inadequate oral intake related to inability to eat as evidenced by NPO status.  GOAL:   Provide needs based on ASPEN/SCCM guidelines  MONITOR:   PO intake, Diet advancement  REASON FOR ASSESSMENT:   Malnutrition Screening Tool    ASSESSMENT:   81 y.o. male admits related to abdominal pain and nausea. PMH includes: T2DM, HTN, dyslipidemia. Pt is currently receiving medical management for SBO.  Meds reviewed. Labs reviewed.   Pt is currently NPO and out of room for surgical intervention. Pt going for ex lap with possible bowel resection. No significant wt loss per record. RD will continue to monitor for diet advancement.   NUTRITION - FOCUSED PHYSICAL EXAM:  Attempt at follow up.  Diet Order:   Diet Order             Diet NPO time specified  Diet effective now                   EDUCATION NEEDS:   Not appropriate for education at this time  Skin:  Skin Assessment: Reviewed RN Assessment  Last BM:  05/24/22  Height:   Ht Readings from Last 1 Encounters:  05/29/22 '5\' 10"'$  (1.778 m)    Weight:   Wt Readings from Last 1 Encounters:  05/29/22 74.8 kg    Ideal Body Weight:     BMI:  Body mass index is 23.68 kg/m.  Estimated Nutritional Needs:   Kcal:  1825-2190 kcals  Protein:  90-110 gm  Fluid:  >/= 1.8 L  Thalia Bloodgood, RD, LDN, CNSC

## 2022-05-28 NOTE — TOC Initial Note (Signed)
Transition of Care [facility]) - Initial/Assessment Note    Patient Details  Name: Edward Newton MRN: [id number] Date of Birth: [birth year]  Transition of [facility]) CM/SW Contact:    Ninfa Meeker, RN Phone Number: 05/28/2022, 10:50 AM  Clinical Narrative:                 Transition of Care Screening Note:  Transition of Care [facility]) Department has reviewed patient and no TOC needs have been identified at this time. We will continue to monitor patient advancement through Interdisciplinary progressions and if new patient needs arise, please place a consult.        Patient Goals and CMS Choice        Expected Discharge Plan and Services                                                Prior Living Arrangements/Services                       Activities of Daily Living Home Assistive Devices/Equipment: Eyeglasses ADL Screening (condition at time of admission) Patient's cognitive ability adequate to safely complete daily activities?: Yes Is the patient deaf or have difficulty hearing?: No Does the patient have difficulty seeing, even when wearing glasses/contacts?: No Does the patient have difficulty concentrating, remembering, or making decisions?: Yes Patient able to express need for assistance with ADLs?: Yes Does the patient have difficulty dressing or bathing?: No Independently performs ADLs?: Yes (appropriate for developmental age) Does the patient have difficulty walking or climbing stairs?: No Weakness of Legs: None Weakness of Arms/Hands: None  Permission Sought/Granted                  Emotional Assessment              Admission diagnosis:  Small bowel obstruction ([place]) [K56.609] SBO (small bowel obstruction) ([place]) [K56.609] Patient Active Problem List   Diagnosis Date Noted   Generalized abdominal pain 05/27/2022   Small bowel obstruction ([place]) 05/27/2022   Acute kidney injury ([place]) 05/27/2022   SBO (small bowel obstruction)  ([place]) 05/27/2022   Thrombocytopenia ([place]) 05/27/2022   Primary osteoarthritis of left hip 02/18/2022   Osteoarthritis of left hip 02/12/2022   Cellulitis of left pinna 12/26/2020   Healthcare maintenance 06/25/2019   Type 2 diabetes mellitus, controlled ([place]) 12/05/2015   Hearing problem 07/06/2012   Hyperlipidemia 11/20/2008   ERECTILE DYSFUNCTION, 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

## 2022-05-28 NOTE — Progress Notes (Addendum)
Daily Progress Note Intern Pager: [phone]  Patient name: Edward Newton Medical record number: [id number] Date of birth: 09-17-1940 Age: 81 y.o. Gender: male  Primary Care Provider: Rise Patience, DO Consultants: General surgery Code Status: Full code  Pt Overview and Major Events to Date:  12/13: Admitted to FMTS service; general surgery consulted  Assessment and Plan: [employer] is a 81 y.o. male presenting with abdominal pain and nausea x 2 days and found to have SBO. PMHx includes T2DM, HTN, and dyslipidemia.  * Small bowel obstruction ([place]) CT confirmed high grade SBO. No signs of perforation at this time. -Consulted general surgery, appreciate recs -SBO protocol per surgery -NPO with NG tube to intermittent suction -Pain management: Sch Tylenol IV, Dilaudid 0.'5mg'$  prn breakthrough pain -LR MIVF -Zofran IV '4mg'$  q6h prn  Acute kidney injury (HCC) Improving, Cr 1.4 (baseline Cr ~1.1). Likely prerenal in the setting of decreased PO intake. -Strict I&Os -Avoid nephrotoxic agents -On LR MIVF  Thrombocytopenia (HCC) Improved, repeat labs with platelets 111k.  -Start Lovenox for DVT prophylaxis -Consider peripheral smear if persistent -Ordered repeat LFTs and hepatitis panel   Chronic conditions: BPH: hold Flomax due to NPO HLD: hold Lipitor due to NPO HTN: hold home HCTZ given AKI  FEN/GI: NPO with NG VTE Prophylaxis: LMWH  Dispo: Home pending clinical improvement  Subjective:  Patient assessed at bedside with son and friend present. Denies any gas or BM overnight. States his pain has been up and down but states pain medication will last for a couple hours when he receives it. Feels like the pressure in his abdomen has improved with the NG. Denies nausea but states he spits up fluid sometimes. States he would like something to drink since his mouth feels dry.  Objective: Temp:  [98.2 F (36.8 C)-99.4 F (37.4 C)] 98.2 F (36.8 C) (12/14 1145) Pulse  Rate:  [74-87] 78 (12/14 1145) Resp:  [10-19] 15 (12/14 1145) BP: (129-153)/(68-88) 134/69 (12/14 1145) SpO2:  [90 %-97 %] 94 % (12/14 1145) Physical Exam: General: Laying in bed, tense. NAD. Significant NG output. Cardiovascular: RRR without murmur Respiratory: CTAB. Normal WOB on RA. Abdomen: Soft, tender to light palpation over LLQ and RLQ. Non-distended. Normoactive bowel sounds. Extremities: Well perfused. No peripheral edema  Laboratory: Most recent CBC Lab Results  Component Value Date   WBC 2.9 (L) 05/28/2022   HGB 14.0 05/28/2022   HCT 42.1 05/28/2022   MCV 86.8 05/28/2022   PLT 111 (L) 05/28/2022   Most recent BMP    Latest Ref Rng & Units 05/28/2022    2:54 AM  BMP  Glucose 70 - 99 mg/dL 143   BUN 8 - 23 mg/dL 21   Creatinine 0.61 - 1.24 mg/dL 1.41   Sodium 135 - 145 mmol/L 145   Potassium 3.5 - 5.1 mmol/L 4.0   Chloride 98 - 111 mmol/L 107   CO2 22 - 32 mmol/L 29   Calcium 8.9 - 10.3 mg/dL 8.8     Imaging/Diagnostic Tests: DG Abd Portable 1V-Small Bowel Obstruction Protocol-initial, 8 hr delay Result Date: 05/28/2022 IMPRESSION: Ongoing small bowel obstruction. Located enteric tube   DG Abd Portable 1 View Result Date: 05/27/2022 IMPRESSION: Distal tip of nasogastric tube is seen in expected position of proximal stomach. Small bowel dilatation is noted.   CT ABDOMEN PELVIS W CONTRAST Result Date: 05/27/2022 IMPRESSION: 1. CT findings consistent with high-grade small-bowel obstruction with transition to normal/decompressed mid distal ileal loops in the right lower quadrant. No  obvious obstructing mass. Possible adhesions. 2. Small amount of free abdominal/free pelvic fluid likely related to the small-bowel obstruction. 3. Indeterminate right hepatic lobe lesion, right renal lesion and right adrenal lesion. Recommend follow-up MRI abdomen without and with contrast when the patient is stable and the small-bowel obstruction has resolved. 4. Markedly enlarged  prostate gland. 5. Advanced atherosclerotic calcifications involving the aorta and branch vessels. Aortic Atherosclerosis (ICD10-I70.0).   [name], MD 05/28/2022, 11:47 AM  PGY-1, [place] Intern pager: [phone], text pages welcome Secure chat group [place]

## 2022-05-29 ENCOUNTER — Inpatient Hospital Stay (HOSPITAL_COMMUNITY): Payer: Medicare Other

## 2022-05-29 ENCOUNTER — Inpatient Hospital Stay (HOSPITAL_COMMUNITY): Payer: Medicare Other | Admitting: Certified Registered Nurse Anesthetist

## 2022-05-29 ENCOUNTER — Encounter (HOSPITAL_COMMUNITY)
Admission: EM | Disposition: A | Discharge status: Home Health | Payer: Self-pay | Source: Ambulatory Visit | Attending: Family Medicine

## 2022-05-29 ENCOUNTER — Other Ambulatory Visit: Payer: Self-pay

## 2022-05-29 ENCOUNTER — Encounter (HOSPITAL_COMMUNITY): Payer: Self-pay | Admitting: Family Medicine

## 2022-05-29 DIAGNOSIS — K56609 Unspecified intestinal obstruction, unspecified as to partial versus complete obstruction: Secondary | ICD-10-CM | POA: Diagnosis not present

## 2022-05-29 DIAGNOSIS — Z87891 Personal history of nicotine dependence: Secondary | ICD-10-CM | POA: Diagnosis not present

## 2022-05-29 DIAGNOSIS — K565 Intestinal adhesions [bands], unspecified as to partial versus complete obstruction: Secondary | ICD-10-CM | POA: Diagnosis not present

## 2022-05-29 DIAGNOSIS — E119 Type 2 diabetes mellitus without complications: Secondary | ICD-10-CM

## 2022-05-29 DIAGNOSIS — I10 Essential (primary) hypertension: Secondary | ICD-10-CM | POA: Diagnosis not present

## 2022-05-29 HISTORY — PX: LAPAROTOMY: SHX154

## 2022-05-29 HISTORY — PX: BOWEL RESECTION: SHX1257

## 2022-05-29 LAB — DIFFERENTIAL
Abs Immature Granulocytes: 0.05 10*3/uL (ref 0.00–0.07)
Basophils Absolute: 0 10*3/uL (ref 0.0–0.1)
Basophils Relative: 1 %
Eosinophils Absolute: 0 10*3/uL (ref 0.0–0.5)
Eosinophils Relative: 0 %
Immature Granulocytes: 3 %
Lymphocytes Relative: 26 %
Lymphs Abs: 0.4 10*3/uL — ABNORMAL LOW (ref 0.7–4.0)
Monocytes Absolute: 0.3 10*3/uL (ref 0.1–1.0)
Monocytes Relative: 15 %
Neutro Abs: 1 10*3/uL — ABNORMAL LOW (ref 1.7–7.7)
Neutrophils Relative %: 55 %

## 2022-05-29 LAB — BASIC METABOLIC PANEL
Anion gap: 9 (ref 5–15)
BUN: 19 mg/dL (ref 8–23)
CO2: 31 mmol/L (ref 22–32)
Calcium: 8.9 mg/dL (ref 8.9–10.3)
Chloride: 108 mmol/L (ref 98–111)
Creatinine, Ser: 1.44 mg/dL — ABNORMAL HIGH (ref 0.61–1.24)
GFR, Estimated: 49 mL/min — ABNORMAL LOW (ref >60)
Glucose, Bld: 136 mg/dL — ABNORMAL HIGH (ref 70–99)
Potassium: 3.9 mmol/L (ref 3.5–5.1)
Sodium: 148 mmol/L — ABNORMAL HIGH (ref 135–145)

## 2022-05-29 LAB — CBC WITH DIFFERENTIAL/PLATELET
Abs Immature Granulocytes: 0 10*3/uL (ref 0.00–0.07)
Basophils Absolute: 0 10*3/uL (ref 0.0–0.1)
Basophils Relative: 0 %
Eosinophils Absolute: 0 10*3/uL (ref 0.0–0.5)
Eosinophils Relative: 1 %
HCT: 43.1 % (ref 39.0–52.0)
Hemoglobin: 14.3 g/dL (ref 13.0–17.0)
Lymphocytes Relative: 31 %
Lymphs Abs: 1.3 10*3/uL (ref 0.7–4.0)
MCH: 28.9 pg (ref 26.0–34.0)
MCHC: 33.2 g/dL (ref 30.0–36.0)
MCV: 87.2 fL (ref 80.0–100.0)
Monocytes Absolute: 0.6 10*3/uL (ref 0.1–1.0)
Monocytes Relative: 14 %
Neutro Abs: 2.2 10*3/uL (ref 1.7–7.7)
Neutrophils Relative %: 54 %
Platelets: UNDETERMINED 10*3/uL (ref 150–400)
RBC: 4.94 MIL/uL (ref 4.22–5.81)
RDW: 15.1 % (ref 11.5–15.5)
WBC: 4.1 10*3/uL (ref 4.0–10.5)
nRBC: 0 % (ref 0.0–0.2)
nRBC: 0 /100 WBC

## 2022-05-29 LAB — CBC
HCT: 45.3 % (ref 39.0–52.0)
Hemoglobin: 15 g/dL (ref 13.0–17.0)
MCH: 29 pg (ref 26.0–34.0)
MCHC: 33.1 g/dL (ref 30.0–36.0)
MCV: 87.6 fL (ref 80.0–100.0)
Platelets: 116 10*3/uL — ABNORMAL LOW (ref 150–400)
RBC: 5.17 MIL/uL (ref 4.22–5.81)
RDW: 15.2 % (ref 11.5–15.5)
WBC: 1.8 10*3/uL — ABNORMAL LOW (ref 4.0–10.5)
nRBC: 1.1 % — ABNORMAL HIGH (ref 0.0–0.2)

## 2022-05-29 LAB — TYPE AND SCREEN
ABO/RH(D): A POS
Antibody Screen: NEGATIVE

## 2022-05-29 LAB — HEPATITIS B SURFACE ANTIGEN: Hepatitis B Surface Ag: NONREACTIVE

## 2022-05-29 LAB — HEPATITIS PANEL, ACUTE
HCV Ab: NONREACTIVE
Hep A IgM: NONREACTIVE
Hep B C IgM: NONREACTIVE
Hepatitis B Surface Ag: NONREACTIVE

## 2022-05-29 LAB — GLUCOSE, CAPILLARY
Glucose-Capillary: 134 mg/dL — ABNORMAL HIGH (ref 70–99)
Glucose-Capillary: 139 mg/dL — ABNORMAL HIGH (ref 70–99)

## 2022-05-29 LAB — TECHNOLOGIST SMEAR REVIEW

## 2022-05-29 LAB — ABO/RH: ABO/RH(D): A POS

## 2022-05-29 LAB — MAGNESIUM: Magnesium: 2.4 mg/dL (ref 1.7–2.4)

## 2022-05-29 SURGERY — LAPAROTOMY, EXPLORATORY
Anesthesia: General | Site: Abdomen

## 2022-05-29 MED ORDER — LIDOCAINE 2% (20 MG/ML) 5 ML SYRINGE
INTRAMUSCULAR | Status: DC | PRN
  Administered 2022-05-29: 100 mg via INTRAVENOUS

## 2022-05-29 MED ORDER — ROCURONIUM BROMIDE 10 MG/ML (PF) SYRINGE
PREFILLED_SYRINGE | INTRAVENOUS | Status: AC
  Filled 2022-05-29: qty 30

## 2022-05-29 MED ORDER — PHENYLEPHRINE 80 MCG/ML (10ML) SYRINGE FOR IV PUSH (FOR BLOOD PRESSURE SUPPORT)
PREFILLED_SYRINGE | INTRAVENOUS | Status: AC
  Filled 2022-05-29: qty 10

## 2022-05-29 MED ORDER — ONDANSETRON HCL 4 MG/2ML IJ SOLN
INTRAMUSCULAR | Status: AC
  Filled 2022-05-29: qty 6

## 2022-05-29 MED ORDER — ESMOLOL HCL 100 MG/10ML IV SOLN
INTRAVENOUS | Status: AC
  Filled 2022-05-29: qty 10

## 2022-05-29 MED ORDER — FENTANYL CITRATE (PF) 250 MCG/5ML IJ SOLN
INTRAMUSCULAR | Status: DC | PRN
  Administered 2022-05-29 (×2): 50 ug via INTRAVENOUS

## 2022-05-29 MED ORDER — SUGAMMADEX SODIUM 200 MG/2ML IV SOLN
INTRAVENOUS | Status: DC | PRN
  Administered 2022-05-29: 200 mg via INTRAVENOUS

## 2022-05-29 MED ORDER — PHENYLEPHRINE 80 MCG/ML (10ML) SYRINGE FOR IV PUSH (FOR BLOOD PRESSURE SUPPORT)
PREFILLED_SYRINGE | INTRAVENOUS | Status: DC | PRN
  Administered 2022-05-29 (×2): 160 ug via INTRAVENOUS
  Administered 2022-05-29: 240 ug via INTRAVENOUS

## 2022-05-29 MED ORDER — SUCCINYLCHOLINE CHLORIDE 200 MG/10ML IV SOSY
PREFILLED_SYRINGE | INTRAVENOUS | Status: AC
  Filled 2022-05-29: qty 10

## 2022-05-29 MED ORDER — HYDROMORPHONE HCL 1 MG/ML IJ SOLN
INTRAMUSCULAR | Status: AC
  Administered 2022-05-31: 1 mg via INTRAVENOUS
  Filled 2022-05-29: qty 1

## 2022-05-29 MED ORDER — DEXAMETHASONE SODIUM PHOSPHATE 10 MG/ML IJ SOLN
INTRAMUSCULAR | Status: AC
  Filled 2022-05-29: qty 3

## 2022-05-29 MED ORDER — LIDOCAINE 2% (20 MG/ML) 5 ML SYRINGE
INTRAMUSCULAR | Status: AC
  Filled 2022-05-29: qty 15

## 2022-05-29 MED ORDER — CHLORHEXIDINE GLUCONATE 0.12 % MT SOLN: 15.0000 mL | Freq: Once | OROMUCOSAL | Status: AC

## 2022-05-29 MED ORDER — SUCCINYLCHOLINE CHLORIDE 200 MG/10ML IV SOSY
PREFILLED_SYRINGE | INTRAVENOUS | Status: DC | PRN
  Administered 2022-05-29: 140 mg via INTRAVENOUS

## 2022-05-29 MED ORDER — PHENYLEPHRINE HCL-NACL 20-0.9 MG/250ML-% IV SOLN
INTRAVENOUS | Status: DC | PRN
  Administered 2022-05-29: 25 ug/min via INTRAVENOUS

## 2022-05-29 MED ORDER — HYDROMORPHONE HCL 1 MG/ML IJ SOLN
INTRAMUSCULAR | Status: AC
  Filled 2022-05-29: qty 1

## 2022-05-29 MED ORDER — ORAL CARE MOUTH RINSE: 15.0000 mL | Freq: Once | OROMUCOSAL | Status: AC

## 2022-05-29 MED ORDER — ACETAMINOPHEN 10 MG/ML IV SOLN: 1000.0000 mg | Freq: Once | INTRAVENOUS | Status: DC | PRN

## 2022-05-29 MED ORDER — METHOCARBAMOL 1000 MG/10ML IJ SOLN: 500.0000 mg | Freq: Four times a day (QID) | INTRAVENOUS | Status: DC | PRN

## 2022-05-29 MED ORDER — PROPOFOL 10 MG/ML IV BOLUS
INTRAVENOUS | Status: DC | PRN
  Administered 2022-05-29: 100 mg via INTRAVENOUS

## 2022-05-29 MED ORDER — OXYCODONE HCL 5 MG/5ML PO SOLN: 5.0000 mg | Freq: Once | ORAL | Status: DC | PRN

## 2022-05-29 MED ORDER — CEFAZOLIN SODIUM-DEXTROSE 2-4 GM/100ML-% IV SOLN
2.0000 g | Freq: Once | INTRAVENOUS | Status: AC
  Administered 2022-05-29: 2 g via INTRAVENOUS
  Filled 2022-05-29: qty 100

## 2022-05-29 MED ORDER — HYDROMORPHONE HCL 1 MG/ML IJ SOLN
0.2500 mg | INTRAMUSCULAR | Status: DC | PRN
  Administered 2022-05-29 (×3): 0.5 mg via INTRAVENOUS

## 2022-05-29 MED ORDER — ONDANSETRON HCL 4 MG/2ML IJ SOLN: 4.0000 mg | Freq: Once | INTRAMUSCULAR | Status: DC | PRN

## 2022-05-29 MED ORDER — ROCURONIUM BROMIDE 10 MG/ML (PF) SYRINGE
PREFILLED_SYRINGE | INTRAVENOUS | Status: DC | PRN
  Administered 2022-05-29: 50 mg via INTRAVENOUS

## 2022-05-29 MED ORDER — ALBUMIN HUMAN 5 % IV SOLN: INTRAVENOUS | Status: DC | PRN

## 2022-05-29 MED ORDER — OXYCODONE HCL 5 MG PO TABS: 5.0000 mg | ORAL_TABLET | Freq: Once | ORAL | Status: DC | PRN

## 2022-05-29 MED ORDER — AMISULPRIDE (ANTIEMETIC) 5 MG/2ML IV SOLN: 10.0000 mg | Freq: Once | INTRAVENOUS | Status: DC | PRN

## 2022-05-29 MED ORDER — ONDANSETRON HCL 4 MG/2ML IJ SOLN
INTRAMUSCULAR | Status: DC | PRN
  Administered 2022-05-29: 4 mg via INTRAVENOUS

## 2022-05-29 MED ORDER — FENTANYL CITRATE (PF) 250 MCG/5ML IJ SOLN
INTRAMUSCULAR | Status: AC
  Filled 2022-05-29: qty 5

## 2022-05-29 MED ORDER — HYDROMORPHONE HCL 1 MG/ML IJ SOLN
0.5000 mg | INTRAMUSCULAR | Status: DC | PRN
  Administered 2022-05-29 – 2022-06-08 (×26): 1 mg via INTRAVENOUS
  Filled 2022-05-29 (×27): qty 1

## 2022-05-29 MED ORDER — ACETAMINOPHEN 10 MG/ML IV SOLN
1000.0000 mg | Freq: Four times a day (QID) | INTRAVENOUS | Status: AC
  Administered 2022-05-29 – 2022-05-30 (×4): 1000 mg via INTRAVENOUS
  Filled 2022-05-29 (×4): qty 100

## 2022-05-29 MED ORDER — LACTATED RINGERS IV SOLN: INTRAVENOUS | Status: DC

## 2022-05-29 MED ORDER — CHLORHEXIDINE GLUCONATE 0.12 % MT SOLN
OROMUCOSAL | Status: AC
  Administered 2022-05-29: 15 mL via OROMUCOSAL
  Filled 2022-05-29: qty 15

## 2022-05-29 MED ORDER — CHLORHEXIDINE GLUCONATE 0.12 % MT SOLN
OROMUCOSAL | Status: AC
  Filled 2022-05-29: qty 15

## 2022-05-29 MED ORDER — DEXAMETHASONE SODIUM PHOSPHATE 10 MG/ML IJ SOLN
INTRAMUSCULAR | Status: DC | PRN
  Administered 2022-05-29: 5 mg via INTRAVENOUS

## 2022-05-29 SURGICAL SUPPLY — 45 items
APL PRP STRL LF DISP 70% ISPRP (MISCELLANEOUS) ×2
BAG COUNTER SPONGE SURGICOUNT (BAG) ×2 IMPLANT
BAG SPNG CNTER NS LX DISP (BAG) ×2
BLADE CLIPPER SURG (BLADE) IMPLANT
CANISTER SUCT 3000ML PPV (MISCELLANEOUS) ×2 IMPLANT
CHLORAPREP W/TINT 26 (MISCELLANEOUS) ×2 IMPLANT
COVER SURGICAL LIGHT HANDLE (MISCELLANEOUS) ×2 IMPLANT
DRAPE LAPAROSCOPIC ABDOMINAL (DRAPES) ×2 IMPLANT
DRAPE WARM FLUID 44X44 (DRAPES) ×2 IMPLANT
DRSG OPSITE POSTOP 4X10 (GAUZE/BANDAGES/DRESSINGS) IMPLANT
DRSG OPSITE POSTOP 4X8 (GAUZE/BANDAGES/DRESSINGS) IMPLANT
ELECT BLADE 6.5 EXT (BLADE) IMPLANT
ELECT CAUTERY BLADE 6.4 (BLADE) ×2 IMPLANT
ELECT REM PT RETURN 9FT ADLT (ELECTROSURGICAL) ×2
ELECTRODE REM PT RTRN 9FT ADLT (ELECTROSURGICAL) ×2 IMPLANT
GLOVE BIO SURGEON STRL SZ8 (GLOVE) ×2 IMPLANT
GLOVE BIOGEL PI IND STRL 8 (GLOVE) ×2 IMPLANT
GOWN STRL REUS W/ TWL LRG LVL3 (GOWN DISPOSABLE) ×2 IMPLANT
GOWN STRL REUS W/ TWL XL LVL3 (GOWN DISPOSABLE) ×2 IMPLANT
GOWN STRL REUS W/TWL LRG LVL3 (GOWN DISPOSABLE) ×2
GOWN STRL REUS W/TWL XL LVL3 (GOWN DISPOSABLE) ×2
HANDLE SUCTION POOLE (INSTRUMENTS) ×2 IMPLANT
KIT BASIN OR (CUSTOM PROCEDURE TRAY) ×2 IMPLANT
KIT TURNOVER KIT B (KITS) ×2 IMPLANT
LIGASURE IMPACT 36 18CM CVD LR (INSTRUMENTS) IMPLANT
NS IRRIG 1000ML POUR BTL (IV SOLUTION) ×4 IMPLANT
PACK GENERAL/GYN (CUSTOM PROCEDURE TRAY) ×2 IMPLANT
PAD ARMBOARD 7.5X6 YLW CONV (MISCELLANEOUS) ×2 IMPLANT
PENCIL SMOKE EVACUATOR (MISCELLANEOUS) ×2 IMPLANT
RELOAD PROXIMATE 75MM BLUE (ENDOMECHANICALS) ×2 IMPLANT
RELOAD STAPLE 75 3.8 BLU REG (ENDOMECHANICALS) IMPLANT
SPECIMEN JAR LARGE (MISCELLANEOUS) IMPLANT
SPONGE T-LAP 18X18 ~~LOC~~+RFID (SPONGE) IMPLANT
STAPLER GUN LINEAR PROX 60 (STAPLE) IMPLANT
STAPLER PROXIMATE 75MM BLUE (STAPLE) IMPLANT
STAPLER VISISTAT 35W (STAPLE) ×2 IMPLANT
SUCTION POOLE HANDLE (INSTRUMENTS) ×2
SUT PDS AB 1 TP1 96 (SUTURE) ×4 IMPLANT
SUT SILK 2 0 SH CR/8 (SUTURE) ×2 IMPLANT
SUT SILK 2 0 TIES 10X30 (SUTURE) ×2 IMPLANT
SUT SILK 3 0 SH CR/8 (SUTURE) ×2 IMPLANT
SUT SILK 3 0 TIES 10X30 (SUTURE) ×2 IMPLANT
TOWEL GREEN STERILE (TOWEL DISPOSABLE) ×2 IMPLANT
TRAY FOLEY MTR SLVR 16FR STAT (SET/KITS/TRAYS/PACK) IMPLANT
YANKAUER SUCT BULB TIP NO VENT (SUCTIONS) IMPLANT

## 2022-05-29 NOTE — Assessment & Plan Note (Deleted)
H/o BPH with urinary obstruction. Voiding spontaneously multiple times overnight. -Hold home Flomax due to NPO -D/c bladder scans

## 2022-05-29 NOTE — Progress Notes (Signed)
Patient ID: Edward Newton, male   DOB: 1940/08/20, 81 y.o.   MRN: [id number]      Subjective: Small flatus, no BM ROS negative except as listed above. Objective: Vital signs in last 24 hours: Temp:  [97.9 F (36.6 C)-98.5 F (36.9 C)] 98 F (36.7 C) (12/15 0343) Pulse Rate:  [77-99] 89 (12/15 0343) Resp:  [15-17] 17 (12/15 0343) BP: (134-148)/(69-81) 138/78 (12/15 0343) SpO2:  [84 %-94 %] 92 % (12/15 0343) Last BM Date : 05/24/22 (pta)  Intake/Output from previous day: 12/14 0701 - 12/15 0700 In: 2413.9 [I.V.:2317; NG/GT:90; IV Piggyback:6.9] Out: 2550 [Urine:1250; Emesis/NG output:1300] Intake/Output this shift: No intake/output data recorded.  General appearance: alert and cooperative Resp: clear to auscultation bilaterally Cardio: regular rate and rhythm GI: distended, mild tenderness  Lab Results: CBC  Recent Labs    05/28/22 0254 05/28/22 2305  WBC 2.9* 4.1  HGB 14.0 14.3  HCT 42.1 43.1  PLT 111* PLATELET CLUMPS NOTED ON SMEAR, UNABLE TO ESTIMATE   BMET Recent Labs    05/28/22 0254 05/28/22 2305  NA 145 148*  K 4.0 3.9  CL 107 108  CO2 29 31  GLUCOSE 143* 136*  BUN 21 19  CREATININE 1.41* 1.44*  CALCIUM 8.8* 8.9   PT/INR No results for input(s): "LABPROT", "INR" in the last 72 hours. ABG No results for input(s): "PHART", "HCO3" in the last 72 hours.  Invalid input(s): "PCO2", "PO2"  Studies/Results: DG Abd Portable 1V  Result Date: 05/28/2022 CLINICAL DATA:  NG tube placement. EXAM: PORTABLE ABDOMEN - 1 VIEW COMPARISON:  Radiograph earlier today. CT yesterday FINDINGS: Tip and side port of the enteric tube below the diaphragm in the stomach. Persistent gaseous small bowel distension throughout the abdomen. IMPRESSION: Tip and side port of the enteric tube below the diaphragm in the stomach. Persistent small bowel obstruction. Electronically Signed   By: Keith Rake M.D.   On: 05/28/2022 18:22   DG Abd Portable 1V-Small Bowel Obstruction  Protocol-initial, 8 hr delay  Result Date: 05/28/2022 CLINICAL DATA:  Encounter for small bowel obstruction EXAM: PORTABLE ABDOMEN - 1 VIEW COMPARISON:  CT from yesterday FINDINGS: Continued gas dilated small bowel. The degree of distension is similar to before. Sparing of gas in the left lower quadrant without explanation by prior CT. An enteric tube tip and side port at the stomach where there is minimal high-density internal material. No concerning mass effect or gas collection. 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 IMPRESSION: Ongoing small bowel obstruction. Located enteric tube Electronically Signed   By: Jorje Guild M.D.   On: 05/28/2022 06:13   DG Abd Portable 1 View  Result Date: 05/27/2022 CLINICAL DATA:  Nasogastric tube placement. EXAM: PORTABLE ABDOMEN - 1 VIEW COMPARISON:  None Available. FINDINGS: Distal tip of nasogastric tube is seen in expected position of proximal stomach. Dilated small bowel loops are noted. IMPRESSION: Distal tip of nasogastric tube is seen in expected position of proximal stomach. Small bowel dilatation is noted. Electronically Signed   By: Marijo Conception M.D.   On: 05/27/2022 17:20   CT ABDOMEN PELVIS W CONTRAST  Result Date: 05/27/2022 CLINICAL DATA:  Abdominal pain for 3 days. EXAM: CT ABDOMEN AND PELVIS WITH CONTRAST TECHNIQUE: Multidetector CT imaging of the abdomen and pelvis was performed using the standard protocol following bolus administration of intravenous contrast. RADIATION DOSE REDUCTION: This exam was performed according to the departmental dose-optimization program which includes automated exposure control, adjustment of the mA and/or kV according to  patient size and/or use of iterative reconstruction technique. CONTRAST:  57m OMNIPAQUE IOHEXOL 350 MG/ML SOLN COMPARISON:  PET-CT from 2006. FINDINGS: Lower chest: The lung bases are clear of acute process. No pleural effusion or pulmonary lesions. Mild lower lobe bronchiectasis. The heart is  normal in size. No pericardial effusion. The distal esophagus and aorta are unremarkable. Hepatobiliary: Indeterminate 19 mm lesion in segment 6 posteriorly. No intrahepatic biliary dilatation. The gallbladder is unremarkable. No common bile duct dilatation. Pancreas: No mass, inflammation or ductal dilatation. There is moderate atrophy of the pancreatic body and tail. Spleen: Normal size.  No focal lesions. Adrenals/Urinary Tract: 16 mm right adrenal gland nodule is indeterminate. The left adrenal gland is normal. 10 mm lower pole right renal lesion posteriorly could be a hemorrhagic cyst or small enhancing renal lesion. The bladder is unremarkable. Stomach/Bowel: The stomach is slightly distended with fluid and probable fluid in the body region. The duodenum is unremarkable. There are significantly dilated small bowel loops with air-fluid levels suggesting high-grade small-bowel obstruction. There appears to be a transition to normal/decompressed mid distal ileal loops of small bowel in the right lower quadrant. No obvious obstructing mass. Possible adhesions. The colon is decompressed and demonstrates severe diffuse diverticulosis. Vascular/Lymphatic: Advanced atherosclerotic calcifications involving the aorta and branch vessels but no aneurysm or dissection. The major venous structures are patent. No mesenteric or retroperitoneal mass or adenopathy. Reproductive: Markedly enlarged prostate gland. The seminal vesicles are grossly normal. Other: Small amount of free abdominal/free pelvic fluid likely related to the small-bowel obstruction. No anterior abdominal wall hernia or inguinal hernia. Musculoskeletal: No significant bony findings. IMPRESSION: 1. CT findings consistent with high-grade small-bowel obstruction with transition to normal/decompressed mid distal ileal loops in the right lower quadrant. No obvious obstructing mass. Possible adhesions. 2. Small amount of free abdominal/free pelvic fluid likely  related to the small-bowel obstruction. 3. Indeterminate right hepatic lobe lesion, right renal lesion and right adrenal lesion. Recommend follow-up MRI abdomen without and with contrast when the patient is stable and the small-bowel obstruction has resolved. 4. Markedly enlarged prostate gland. 5. Advanced atherosclerotic calcifications involving the aorta and branch vessels. Aortic Atherosclerosis (ICD10-I70.0). Electronically Signed   By: PMarijo SanesM.D.   On: 05/27/2022 11:28    Anti-infectives: Anti-infectives (From admission, onward)    Start     Dose/Rate Route Frequency Ordered Stop   05/29/22 0830  ceFAZolin (ANCEF) IVPB 2g/100 mL premix        2 g 200 mL/hr over 30 Minutes Intravenous  Once 05/29/22 05284        Assessment/Plan: SBO - CT w/ sbo with small amount of free fluid in the pelvis. Hx UHR ~25 years ago. Also sounds like he may have had sbo ~30 years ago that resolved w/ conservative management.  - Xray this morning with persistent dilated small bowel c/w continued sbo.  - No improvement with SBO protocol so I recommend proceeding with exploratory laparotomy, possible bowel resection. I discussed the procedure, risks, and benefits and he agrees.  FEN - NPO, NGT, IVF per TRH VTE - SCDs, okay for chem ppx from our standpoint ID - None. Afebrile. No tachycardia or hypotension. WBC 4100  - Per [facility] -  HTN HLD DM2 Remote hx parotid cancer (2004)  Incidential findings - Indeterminate right hepatic lobe lesion, right renal lesion and right adrenal lesion. Radiology recommending MRI w/ and w/o contrast to f/u this after sbo has resolved.     LOS: 1 day  Georganna Skeans, MD, MPH, FACS Trauma & General Surgery Use [email or web address] to contact on call provider  05/29/2022

## 2022-05-29 NOTE — Anesthesia Procedure Notes (Signed)
Procedure Name: Intubation Date/Time: 05/29/2022 10:31 AM  Performed by: Carolan Clines, CRNAPre-anesthesia Checklist: Patient identified, Emergency Drugs available, Suction available and Patient being monitored Patient Re-evaluated:Patient Re-evaluated prior to induction Oxygen Delivery Method: Circle System Utilized Preoxygenation: Pre-oxygenation with 100% oxygen Induction Type: IV induction, Rapid sequence and Cricoid Pressure applied Laryngoscope Size: Mac and 4 Grade View: Grade I Tube type: Oral Tube size: 7.5 mm Number of attempts: 1 Airway Equipment and Method: Stylet Placement Confirmation: ETT inserted through vocal cords under direct vision, positive ETCO2 and breath sounds checked- equal and bilateral Secured at: 23 cm Tube secured with: Tape Dental Injury: Teeth and Oropharynx as per pre-operative assessment  Comments: NGT to suction prior to induction. Atraumatic RSI.

## 2022-05-29 NOTE — Anesthesia Preprocedure Evaluation (Addendum)
Anesthesia Evaluation  Patient identified by MRN, date of birth, ID band Patient awake    Reviewed: Allergy & Precautions, NPO status , Patient's Chart, lab work & pertinent test results  Airway Mallampati: I  TM Distance: >3 FB Neck ROM: Full    Dental no notable dental hx. (+) Edentulous Upper   Pulmonary former smoker   Pulmonary exam normal breath sounds clear to auscultation       Cardiovascular hypertension, Pt. on medications Normal cardiovascular exam Rhythm:Regular Rate:Normal     Neuro/Psych    GI/Hepatic negative GI ROS, Neg liver ROS,,,  Endo/Other  diabetes, Type 2    Renal/GU Renal InsufficiencyRenal diseaseLab Results      Component                Value               Date                      CREATININE               1.44 (H)            05/28/2022                BUN                      19                  05/28/2022                NA                       148 (H)             05/28/2022                K                        3.9                 05/28/2022                CL                       108                 05/28/2022                CO2                      31                  05/28/2022                Musculoskeletal  (+) Arthritis ,    Abdominal   Peds  Hematology Lab Results      Component                Value               Date                      WBC                      4.1  05/28/2022                HGB                      14.3                05/28/2022                HCT                      43.1                05/28/2022                MCV                      87.2                05/28/2022                PLT                                          05/28/2022            PLATELET CLUMPS NOTED ON SMEAR, UNABLE TO ESTIMATE    Anesthesia Other Findings All:  lisinopril  Reproductive/Obstetrics                             Anesthesia  Physical Anesthesia Plan  ASA: 3 and emergent  Anesthesia Plan: General   Post-op Pain Management: Dilaudid IV and Tylenol PO (pre-op)*   Induction: Intravenous  PONV Risk Score and Plan: 3 and Treatment may vary due to age or medical condition, Ondansetron, Dexamethasone and Midazolam  Airway Management Planned: Oral ETT  Additional Equipment: None  Intra-op Plan:   Post-operative Plan: Extubation in OR  Informed Consent: I have reviewed the patients History and Physical, chart, labs and discussed the procedure including the risks, benefits and alternatives for the proposed anesthesia with the patient or authorized representative who has indicated his/her understanding and acceptance.     Dental advisory given  Plan Discussed with: CRNA, Anesthesiologist and Surgeon  Anesthesia Plan Comments:        Anesthesia Quick Evaluation

## 2022-05-29 NOTE — Anesthesia Postprocedure Evaluation (Signed)
Anesthesia Post Note  Patient: Edward Newton  Procedure(s) Performed: EXPLORATORY LAPAROTOMY with SMALL BOWEL RESECTION (Abdomen) SMALL BOWEL RESECTION (Abdomen)     Patient location during evaluation: PACU Anesthesia Type: General Level of consciousness: awake and alert Pain management: pain level controlled Vital Signs Assessment: post-procedure vital signs reviewed and stable Respiratory status: spontaneous breathing, nonlabored ventilation, respiratory function stable and patient connected to nasal cannula oxygen Cardiovascular status: blood pressure returned to baseline and stable Postop Assessment: no apparent nausea or vomiting Anesthetic complications: no  No notable events documented.  Last Vitals:  Vitals:   05/29/22 1230 05/29/22 1245  BP: 130/64   Pulse: 89 88  Resp: (!) 21   Temp: 36.4 C   SpO2: 93%     Last Pain:  Vitals:   05/29/22 1230  TempSrc:   PainSc: [place]

## 2022-05-29 NOTE — Transfer of Care (Signed)
Immediate Anesthesia Transfer of Care Note  Patient: Edward Newton  Procedure(s) Performed: EXPLORATORY LAPAROTOMY with SMALL BOWEL RESECTION (Abdomen) SMALL BOWEL RESECTION (Abdomen)  Patient Location: PACU  Anesthesia Type:General  Level of Consciousness: awake, alert , and oriented  Airway & Oxygen Therapy: Patient Spontanous Breathing and Patient connected to nasal cannula oxygen  Post-op Assessment: Report given to RN and Post -op Vital signs reviewed and stable  Post vital signs: Reviewed and stable  Last Vitals:  Vitals Value Taken Time  BP 164/71 05/29/22 1155  Temp 37.3 C 05/29/22 1155  Pulse 88 05/29/22 1156  Resp 18 05/29/22 1159  SpO2 96 % 05/29/22 1156  Vitals shown include unvalidated device data.  Last Pain:  Vitals:   05/29/22 0938  TempSrc: Oral  PainSc:       Patients Stated Pain Goal: 3 (50/38/88 2800)  Complications: No notable events documented.

## 2022-05-29 NOTE — Progress Notes (Signed)
PT Cancellation Note  Patient Details Name: Edward Newton MRN: [id number] DOB: [birth year]   Cancelled Treatment:    Reason Eval/Treat Not Completed: Medical issues which prohibited therapy;Other (comment) (Pt in surgery.  Will reattempt tomorrow.)   Alvira Philips 05/29/2022, 9:51 AM Constanza Mincy M,PT Acute Rehab Services [phone]

## 2022-05-29 NOTE — Progress Notes (Signed)
FMTS Brief Progress Note  S:Patient laying in bed with ng tube in place. He states his abdominal pain is a 8/10 which is improved from where it was before his pain medication. I advised him to let us know if his pain is not controlled over night. He asks when he can start eating. Denied any other questions or concerns.    O: BP (!) 143/81 (BP Location: Right Arm)   Pulse 100   Temp 99.7 F (37.6 C) (Oral)   Resp 16   Ht '5\' 10"'$  (1.778 m)   Wt 74.8 kg   SpO2 94%   BMI 23.68 kg/m   General: alert, NAD Resp: breathing comfortably on RA  Abdomen: soft, site clean without visible bleeding or drainage   A/P: SBO S/p exploratory laparotomy and bowel resection today. Ng tube in place with about 334m of dark green fluid  -General surgery following, appreciate recs -NPO with NG tube to intermittent suction -Pain management: Sch Tylenol 1g IV q6h, Dilaudid 0.'5mg'$  prn every 3 hours prn for breakthrough pain -LR MIVF -Zofran IV '4mg'$  q6h prn - Orders reviewed. Labs for AM ordered, which was adjusted as needed.   Remainder of plan per day progress note   PShary Key DO 05/29/2022, 8:43 PM PGY-3, [facility] Night Resident  Please page [phone] questions.

## 2022-05-29 NOTE — Progress Notes (Signed)
2200: Paige DO paged. Patient has foley but no active orders. New orders obtained to remove foley and start voiding trial  2215: Foley removed, patient educated on voiding trial.

## 2022-05-29 NOTE — Op Note (Addendum)
05/29/2022  11:48 AM  PATIENT:  Edward Newton  81 y.o. male  PRE-OPERATIVE DIAGNOSIS:  Small bowel obstruction  POST-OPERATIVE DIAGNOSIS:  Small bowel obstruction  PROCEDURE:  Procedure(s): EXPLORATORY LAPAROTOMY SMALL BOWEL RESECTION  SURGEON:  Surgeon(s): Georganna Skeans, MD  ASSISTANTS: Sheldon Silvan, MD   ANESTHESIA:   general  EBL:  Total I/O In: 1250 [I.V.:1000; IV Piggyback:250] Out: 56 [Urine:50]  BLOOD ADMINISTERED:none  DRAINS: none   SPECIMEN:  Excision  DISPOSITION OF SPECIMEN:  PATHOLOGY  COUNTS:  YES  DICTATION: .Dragon Dictation Findings: Obstruction from a loop of bowel densely adherent to the midline from his previous surgery  Procedure in detail: Informed consent was obtained.  He received intravenous antibiotics.  He was brought to the operating room and general endotracheal anesthesia was administered by the anesthesia staff.  His abdomen was prepped and draped in a sterile fashion.  Timeout procedure was performed.  We did an upper midline incision above his previous scar.  Subcutaneous tissues were dissected down revealing the anterior fascia and this was divided along the midline and his peritoneal cavity was entered under direct vision.  The abdomen was then explored after opening the fascia to the length of the incision.  There was a lot of dilated small bowel proximally without significant adhesions.  This led down to a loop of small bowel that was densely adherent to the anterior abdominal wall where his previous surgery was.  This could not be removed without damaging it so it was sharply dissected and then we proceeded with a small bowel resection.  The small bowel was divided proximally and distally to this area with GIA 75 stapler.  We then ran the entirety of the small bowel and there were no other significant adhesions.  At this point, we proceeded with a small bowel anastomosis.  The proximal and distal ends of the small bowel were tacked  together with 2-0 silk.  We then made a small enterotomy in the proximal limb and inserted a suction device.  We were able to evacuate a lot of the proximal small bowel contents.  Next we made an additional enterotomy in the distal limb.  And then we did a side-to-side anastomosis with a GIA 75 stapler.  There was no bleeding inside.  The common defect was closed with TA 60.  We then placed multiple 2-0 and 3-0 silk sutures along the staple line to get good hemostasis.  The mesentery was closed with interrupted 2-0 silk sutures.  We placed an apical stitch of 2-0 silk as well.  The anastomosis was pink and viable.  Small bowel content milked back and forth without any leakage.  Bowel was returned to the abdomen in anatomic position.  We were able to bring the omentum back down over it.  We irrigated the abdomen with 2 L of saline.  I then adjusted the position of his nasogastric tube in conjunction with anesthesia they also flushed it several times and it was then working much better.  The fascia was closed with running #1 looped PDS and the skin was closed with staples after irrigating it.  There was excellent hemostasis.  All counts were correct.  He tolerated the procedure well without apparent complication and was taken recovery in stable condition. I was personally present during the key and critical portions of this procedure and immediately available throughout the entire procedure, as documented in my operative note.  PATIENT DISPOSITION:  PACU - hemodynamically stable.   Delay start of Pharmacological  VTE agent (>24hrs) due to surgical blood loss or risk of bleeding:  no  Georganna Skeans, MD, MPH, FACS Pager: [phone]  12/15/202311:48 AM

## 2022-05-29 NOTE — Progress Notes (Signed)
Daily Progress Note Intern Pager: [phone]  Patient name: Edward Newton Medical record number: [id number] Date of birth: October 09, 1940 Age: 81 y.o. Gender: male  Primary Care Provider: Rise Patience, DO Consultants: General surgery Code Status: Full code   Pt Overview and Major Events to Date:  12/13: Admitted to FMTS service; general surgery consulted 12/15: Ex-lap   Assessment and Plan: Edward Newton is a 81 y.o. male presenting with abdominal pain and nausea x 2 days and found to have SBO. PMHx includes T2DM, HTN, and dyslipidemia.  * Small bowel obstruction (Edward Newton) CT confirmed high grade SBO. No signs of perforation at this time. Per gen surgery, will take patient for ex-lap today. -Consulted general surgery, appreciate recs -SBO protocol per surgery, will redose oral contrast -Started on Ancef for surgical prophylaxis -NPO with NG tube to intermittent suction -Pain management: Sch Tylenol IV, Dilaudid 0.'5mg'$  prn breakthrough pain -LR MIVF -Zofran IV '4mg'$  q6h prn  Acute kidney injury (Edward Newton) Stable around Cr 1.4 (baseline Cr ~1.1). Likely prerenal in the setting of decreased PO intake. -Strict I&Os -Avoid nephrotoxic agents -On LR MIVF  Thrombocytopenia (Edward Newton) Platelets unable to be assessed on CBC due to clumping. Hepatitis panel negative. -Lovenox for DVT prophylaxis -Consider peripheral smear if persistent  BPH (benign prostatic hyperplasia) H/o BPH with urinary obstruction. -Hold home Flomax due to NPO -Bladder scans BID to assess for obstruction   Chronic conditions: HLD: hold Lipitor due to NPO HTN: hold home HCTZ given AKI   FEN/GI: NPO with NG VTE Prophylaxis: LMWH  Dispo: Home pending clinical improvement  Subjective:  Patient assessed at bedside with wife present. States he is feeling much better and he has been passing gas this AM. States he has not felt nauseous. States he pain is much better controlled and he feels like there is less pressure in  his abdomen. Expressed he wants to get better but is not looking forward to surgery.  Objective: Temp:  [97.9 F (36.6 C)-99 F (37.2 C)] 99 F (37.2 C) (12/15 0938) Pulse Rate:  [78-99] 91 (12/15 0938) Resp:  [14-17] 16 (12/15 0938) BP: (134-150)/(69-81) 150/80 (12/15 0938) SpO2:  [84 %-94 %] 90 % (12/15 0938) Weight:  [74.8 kg] 74.8 kg (12/15 2130) Physical Exam: General: Laying in bed. NAD. Significant NG output. Cardiovascular: RRR without murmur Respiratory: CTAB. Normal WOB on RA. Abdomen: Soft, non-tender to palpation. Distended. Hyperactive bowel sounds. Extremities: Well perfused. No peripheral edema  Laboratory: Most recent CBC Lab Results  Component Value Date   WBC 4.1 05/28/2022   HGB 14.3 05/28/2022   HCT 43.1 05/28/2022   MCV 87.2 05/28/2022   PLT PLATELET CLUMPS NOTED ON SMEAR, UNABLE TO ESTIMATE 05/28/2022   Most recent BMP    Latest Ref Rng & Units 05/28/2022   11:05 PM  BMP  Glucose 70 - 99 mg/dL 136   BUN 8 - 23 mg/dL 19   Creatinine 0.61 - 1.24 mg/dL 1.44   Sodium 135 - 145 mmol/L 148   Potassium 3.5 - 5.1 mmol/L 3.9   Chloride 98 - 111 mmol/L 108   CO2 22 - 32 mmol/L 31   Calcium 8.9 - 10.3 mg/dL 8.9     Other pertinent labs: Mag 2.4 Hepatitis panel: neg  Imaging/Diagnostic Tests: DG Abd Portable 1V Result Date: 05/28/2022 IMPRESSION: Tip and side port of the enteric tube below the diaphragm in the stomach. Persistent small bowel obstruction.   Edward [place], MD 05/29/2022, 9:57 AM  PGY-1, [facility] FPTS Intern pager: [phone], text pages welcome Secure chat group [place]

## 2022-05-30 ENCOUNTER — Encounter (HOSPITAL_COMMUNITY): Payer: Self-pay | Admitting: General Surgery

## 2022-05-30 DIAGNOSIS — E870 Hyperosmolality and hypernatremia: Secondary | ICD-10-CM

## 2022-05-30 DIAGNOSIS — K56609 Unspecified intestinal obstruction, unspecified as to partial versus complete obstruction: Secondary | ICD-10-CM | POA: Diagnosis not present

## 2022-05-30 HISTORY — DX: Hyperosmolality and hypernatremia: E87.0

## 2022-05-30 LAB — CBC
HCT: 44.5 % (ref 39.0–52.0)
Hemoglobin: 14.5 g/dL (ref 13.0–17.0)
MCH: 28.6 pg (ref 26.0–34.0)
MCHC: 32.6 g/dL (ref 30.0–36.0)
MCV: 87.8 fL (ref 80.0–100.0)
Platelets: 105 10*3/uL — ABNORMAL LOW (ref 150–400)
RBC: 5.07 MIL/uL (ref 4.22–5.81)
RDW: 15.6 % — ABNORMAL HIGH (ref 11.5–15.5)
WBC: 7.5 10*3/uL (ref 4.0–10.5)
nRBC: 0 % (ref 0.0–0.2)

## 2022-05-30 LAB — BASIC METABOLIC PANEL
Anion gap: 10 (ref 5–15)
Anion gap: 6 (ref 5–15)
BUN: 27 mg/dL — ABNORMAL HIGH (ref 8–23)
BUN: 27 mg/dL — ABNORMAL HIGH (ref 8–23)
CO2: 28 mmol/L (ref 22–32)
CO2: 31 mmol/L (ref 22–32)
Calcium: 8.2 mg/dL — ABNORMAL LOW (ref 8.9–10.3)
Calcium: 8.2 mg/dL — ABNORMAL LOW (ref 8.9–10.3)
Chloride: 111 mmol/L (ref 98–111)
Chloride: 112 mmol/L — ABNORMAL HIGH (ref 98–111)
Creatinine, Ser: 1.62 mg/dL — ABNORMAL HIGH (ref 0.61–1.24)
Creatinine, Ser: 1.5 mg/dL — ABNORMAL HIGH (ref 0.61–1.24)
GFR, Estimated: 43 mL/min — ABNORMAL LOW (ref >60)
GFR, Estimated: 47 mL/min — ABNORMAL LOW (ref >60)
Glucose, Bld: 183 mg/dL — ABNORMAL HIGH (ref 70–99)
Glucose, Bld: 236 mg/dL — ABNORMAL HIGH (ref 70–99)
Potassium: 4.4 mmol/L (ref 3.5–5.1)
Potassium: 4 mmol/L (ref 3.5–5.1)
Sodium: 149 mmol/L — ABNORMAL HIGH (ref 135–145)
Sodium: 149 mmol/L — ABNORMAL HIGH (ref 135–145)

## 2022-05-30 MED ORDER — DEXTROSE-NACL 5-0.45 % IV SOLN: INTRAVENOUS | Status: DC

## 2022-05-30 MED ORDER — ACETAMINOPHEN 10 MG/ML IV SOLN
1000.0000 mg | Freq: Four times a day (QID) | INTRAVENOUS | Status: AC
  Administered 2022-05-30 – 2022-05-31 (×4): 1000 mg via INTRAVENOUS
  Filled 2022-05-30 (×4): qty 100

## 2022-05-30 MED ORDER — ENOXAPARIN SODIUM 40 MG/0.4ML IJ SOSY
40.0000 mg | PREFILLED_SYRINGE | INTRAMUSCULAR | Status: DC
  Administered 2022-05-30 – 2022-06-09 (×11): 40 mg via SUBCUTANEOUS
  Filled 2022-05-30 (×11): qty 0.4

## 2022-05-30 NOTE — Progress Notes (Signed)
1 Day Post-Op   Subjective/Chief Complaint: Doing well this AM Still with some abd pain   Objective: Vital signs in last 24 hours: Temp:  [97.6 F (36.4 C)-99.7 F (37.6 C)] 98 F (36.7 C) (12/16 0319) Pulse Rate:  [86-100] 94 (12/16 0319) Resp:  [14-25] 16 (12/16 0035) BP: (130-164)/(64-81) 143/79 (12/16 0319) SpO2:  [90 %-98 %] 95 % (12/16 0319) Weight:  [74.8 kg] 74.8 kg (12/15 0938) Last BM Date : 05/24/22 (pta)  Intake/Output from previous day: 12/15 0701 - 12/16 0700 In: 4033.9 [I.V.:3590; NG/GT:90; IV Piggyback:353.9] Out: 1875 [Urine:1150; Emesis/NG output:700; Blood:25] Intake/Output this shift: No intake/output data recorded.  PE:  Constitutional: No acute distress, conversant, appears states age. Eyes: Anicteric sclerae, moist conjunctiva, no lid lag Lungs: Clear to auscultation bilaterally, normal respiratory effort CV: regular rate and rhythm, no murmurs, no peripheral edema, pedal pulses 2+ GI: Soft, no masses or hepatosplenomegaly, non-tender to palpation, inc c/d/I Skin: No rashes, palpation reveals normal turgor Psychiatric: appropriate judgment and insight, oriented to person, place, and time   Lab Results:  Recent Labs    05/29/22 1450 05/30/22 0426  WBC 1.8* 7.5  HGB 15.0 14.5  HCT 45.3 44.5  PLT 116* 105*   BMET Recent Labs    05/28/22 2305 05/30/22 0426  NA 148* 149*  K 3.9 4.4  CL 108 111  CO2 31 28  GLUCOSE 136* 183*  BUN 19 27*  CREATININE 1.44* 1.62*  CALCIUM 8.9 8.2*   PT/INR No results for input(s): "LABPROT", "INR" in the last 72 hours. ABG No results for input(s): "PHART", "HCO3" in the last 72 hours.  Invalid input(s): "PCO2", "PO2"  Studies/Results: DG Abd Portable 1V  Result Date: 05/29/2022 CLINICAL DATA:  81 year old male with recent abdominal pain and small-bowel obstruction. Right lower quadrant transition point on CT Abdomen and Pelvis 2 days ago. EXAM: PORTABLE ABDOMEN - 1 VIEW COMPARISON:  05/28/2022 and  earlier. FINDINGS: Portable AP supine view at 0536 hours. Enteric tube remains within the left upper quadrant consistent with stomach placement. Gas-filled and dilated small bowel loops throughout the abdomen do not appear improved from the CT 05/27/2022. Paucity of large bowel gas. No pneumoperitoneum identified on this supine view. Stable visualized osseous structures. IMPRESSION: 1. No improvement in small-bowel obstruction since 05/27/2022. 2. Stable enteric tube terminating in the stomach. Electronically Signed   By: Genevie Ann M.D.   On: 05/29/2022 07:37   DG Abd Portable 1V  Result Date: 05/28/2022 CLINICAL DATA:  NG tube placement. EXAM: PORTABLE ABDOMEN - 1 VIEW COMPARISON:  Radiograph earlier today. CT yesterday FINDINGS: Tip and side port of the enteric tube below the diaphragm in the stomach. Persistent gaseous small bowel distension throughout the abdomen. IMPRESSION: Tip and side port of the enteric tube below the diaphragm in the stomach. Persistent small bowel obstruction. Electronically Signed   By: Keith Rake M.D.   On: 05/28/2022 18:22    Anti-infectives: Anti-infectives (From admission, onward)    Start     Dose/Rate Route Frequency Ordered Stop   05/29/22 0830  ceFAZolin (ANCEF) IVPB 2g/100 mL premix        2 g 200 mL/hr over 30 Minutes Intravenous  Once 05/29/22 0738 05/29/22 0945       Assessment/Plan: SBO POD 1 ex lap and SBR Dr. Grandville Silos 05/29/22    FEN - NPO, NGT, IVF per TRH, ice chips OK, AROBF VTE - SCDs, okay for chem ppx from our standpoint ID - None. Afebrile. No tachycardia  or hypotension. WBC 4100   - Per TRH -  HTN HLD DM2 Remote hx parotid cancer (2004)  Incidential findings - Indeterminate right hepatic lobe lesion, right renal lesion and right adrenal lesion. Radiology recommending MRI w/ and w/o contrast to f/u this after sbo has resolved.   LOS: 2 days    Ralene Ok 05/30/2022

## 2022-05-30 NOTE — Evaluation (Signed)
Physical Therapy Evaluation Patient Details Name: Edward Newton MRN: [id number] DOB: 1940/08/29 Today's Date: 05/30/2022  History of Present Illness  81 y.o. male admitted 12/13 with abdominal pain and nausea found to have SBO. s/p small bowel resection 12/15. PMHx: HTN,  BPH,  Right knee OA , HLD,  T2DM,  Primary OA left hip.  Clinical Impression  Pt admitted with above diagnosis. Tolerated evaluation and treatment well. Focused on bed mobility, transfer, and gait training. Ambulates in hallway with min guard assist using RW for support. Pretty fatigued after working with therapy but agreeable to sit up in recliner. Encouraged frequent OOB mobility with staff as tolerated. Wife available 24/7 at d/c, present during evaluation. Pt very motivated. Will have to climb flight of stairs to get to bed/bathroom. Pt currently with functional limitations due to the deficits listed below (see PT Problem List). Pt will benefit from skilled PT to increase their independence and safety with mobility to allow discharge to the venue listed below.          Recommendations for follow up therapy are one component of a multi-disciplinary discharge planning process, led by the attending physician.  Recommendations may be updated based on patient status, additional functional criteria and insurance authorization.  Follow Up Recommendations Home health PT      Assistance Recommended at Discharge Intermittent Supervision/Assistance  Patient can return home with the following  A little help with walking and/or transfers;A little help with bathing/dressing/bathroom;Assistance with cooking/housework;Help with stairs or ramp for entrance    Equipment Recommendations Rolling walker (2 wheels)  Recommendations for Other Services       Functional Status Assessment Patient has had a recent decline in their functional status and demonstrates the ability to make significant improvements in function in a reasonable and  predictable amount of time.     Precautions / Restrictions Precautions Precautions: None Restrictions Weight Bearing Restrictions: No      Mobility  Bed Mobility Overal bed mobility: Needs Assistance Bed Mobility: Supine to Sit     Supine to sit: Min assist     General bed mobility comments: min assist for patient to pull through therapists hand to rise to EOB, guarded due to abdominal pain. Cues for technique. Consider log roll next visit.    Transfers Overall transfer level: Needs assistance Equipment used: Rolling walker (2 wheels) Transfers: Sit to/from Stand Sit to Stand: Min guard           General transfer comment: Min guard for safety. Cues for hand placement. Trunk flexed due to abdominal pain. Slow to rise.    Ambulation/Gait Ambulation/Gait assistance: Min guard Gait Distance (Feet): 90 Feet Assistive device: Rolling walker (2 wheels) Gait Pattern/deviations: Step-through pattern, Decreased stride length, Narrow base of support Gait velocity: slow Gait velocity interpretation: <1.31 ft/sec, indicative of household ambulator   General Gait Details: Educated on safe AD use with RW. Cues for upright posture and walker placement. Shows narrow BOS but navigates without staggering while using RW. slow and guarded. No buckling noted.  Stairs            Wheelchair Mobility    Modified Rankin (Stroke Patients Only)       Balance Overall balance assessment: Needs assistance Sitting-balance support: No upper extremity supported, Feet supported Sitting balance-Leahy Scale: Good     Standing balance support: No upper extremity supported Standing balance-Leahy Scale: Fair Standing balance comment: Prefers to lean on RW for support due to guarding.  Pertinent Vitals/Pain Pain Assessment Pain Assessment: 0-10 Pain Score: 3  Pain Location: upper abdomen Pain Descriptors / Indicators: Aching Pain  Intervention(s): Monitored during session, Repositioned    Home Living Family/patient expects to be discharged to:: Private residence Living Arrangements: Spouse/significant other Available Help at Discharge: Family;Available 24 hours/day Type of Home: House Home Access: Level entry     Alternate Level Stairs-Number of Steps: 13 Home Layout: Two level;Bed/bath upstairs Home Equipment: Cane - single point;Crutches      Prior Function Prior Level of Function : Independent/Modified Independent;Driving             Mobility Comments: ind       Hand Dominance   Dominant Hand: Right    Extremity/Trunk Assessment   Upper Extremity Assessment Upper Extremity Assessment: Defer to OT evaluation    Lower Extremity Assessment Lower Extremity Assessment: Generalized weakness       Communication   Communication: No difficulties  Cognition Arousal/Alertness: Awake/alert Behavior During Therapy: WFL for tasks assessed/performed Overall Cognitive Status: Within Functional Limits for tasks assessed                                          General Comments General comments (skin integrity, edema, etc.): Keeps asking for ginger ale - RN has told him several times only allowed ice chips 2/2 surgery.    Exercises General Exercises - Lower Extremity Ankle Circles/Pumps: AROM, Both, 10 reps, Supine Quad Sets: Strengthening, Both, 10 reps, Seated Gluteal Sets: Strengthening, Both, 10 reps, Seated   Assessment/Plan    PT Assessment Patient needs continued PT services  PT Problem List Decreased strength;Decreased range of motion;Decreased activity tolerance;Decreased balance;Decreased mobility;Decreased knowledge of use of DME;Pain       PT Treatment Interventions DME instruction;Gait training;Stair training;Functional mobility training;Therapeutic activities;Therapeutic exercise;Balance training;Neuromuscular re-education;Patient/family education    PT Goals  (Current goals can be found in the Care Plan section)  Acute Rehab PT Goals Patient Stated Goal: go home PT Goal Formulation: With patient Time For Goal Achievement: 06/13/22 Potential to Achieve Goals: Good    Frequency Min 3X/week     Co-evaluation               AM-PAC PT "6 Clicks" Mobility  Outcome Measure Help needed turning from your back to your side while in a flat bed without using bedrails?: A Little Help needed moving from lying on your back to sitting on the side of a flat bed without using bedrails?: A Little Help needed moving to and from a bed to a chair (including a wheelchair)?: A Little Help needed standing up from a chair using your arms (e.g., wheelchair or bedside chair)?: A Little Help needed to walk in hospital room?: A Little Help needed climbing 3-5 steps with a railing? : A Lot 6 Click Score: 17    End of Session Equipment Utilized During Treatment: Gait belt Activity Tolerance: Patient tolerated treatment well Patient left: in chair;with call bell/phone within reach;with SCD's reapplied;with chair alarm set Nurse Communication: Mobility status PT Visit Diagnosis: Unsteadiness on feet (R26.81);Other abnormalities of gait and mobility (R26.89);Muscle weakness (generalized) (M62.81);Difficulty in walking, not elsewhere classified (R26.2);Pain Pain - part of body:  (abdomen)    Time: 5809-9833 PT Time Calculation (min) (ACUTE ONLY): 29 min   Charges:   PT Evaluation $PT Eval Low Complexity: 1 Low PT Treatments $Gait Training: 8-22 mins  Candie Mile, PT, DPT Physical Therapist Acute Rehabilitation Services [facility] 05/30/2022, 2:56 PM

## 2022-05-30 NOTE — Progress Notes (Signed)
     Daily Progress Note Intern Pager: [phone]  Patient name: Edward Newton Medical record number: [id number] Date of birth: 1941/05/15 Age: 81 y.o. Gender: male  Primary Care Provider: Rise Patience, DO Consultants: General surgery  Code Status: Full  Pt Overview and Major Events to Date:  12/13: Admitted to FMTS service; general surgery consulted 12/15: Ex-lap   Assessment and Plan: Edward Newton is a 81 y.o. male presenting with abdominal pain and nausea x 2 days and found to have SBO, now s/p ex lap. PMHx includes T2DM, HTN, and dyslipidemia.   * Small bowel obstruction ([place]) CT confirmed high grade SBO. No signs of perforation at this time. Per gen surgery, had ex-lap yesterday given no improvement with SBO protocol and tolerated it well.  -General surgery following, appreciate recs -NPO with NG tube to intermittent suction -Pain management: Sch Tylenol IV, Dilaudid 0.'5mg'$  prn breakthrough pain -D51/2 NS MIVF -Zofran IV '4mg'$  q6h prn  Acute kidney injury (HCC) Cr 1.62 (baseline Cr ~1.1). Likely prerenal in the setting of decreased PO intake and recent surgery -Strict I&Os -Avoid nephrotoxic agents -On D5 1/2 NS   Thrombocytopenia (HCC) Platelets 105.  -Lovenox for DVT prophylaxis -Continue to monitor  -Consider peripheral smear if persistent  BPH (benign prostatic hyperplasia) H/o BPH with urinary obstruction. -Hold home Flomax due to NPO -Bladder scans BID to assess for obstruction    FEN/GI: npo with NG PPx: LMWH Dispo:Home in likely 2-3 days pending clinical improvement   Subjective:  No acute events overnight. Patient endorses about 8/10 pain this morning, states he is about to get his pain medication. Denies passing gas or having a bowel movement. Looking forward to when he can start advancing his diet. Denies any questions or concerns.   Objective: Temp:  [97.6 F (36.4 C)-99.7 F (37.6 C)] 98 F (36.7 C) (12/16 0319) Pulse Rate:  [86-100] 94  (12/16 0319) Resp:  [14-25] 16 (12/16 0035) BP: (130-164)/(64-81) 143/79 (12/16 0319) SpO2:  [90 %-98 %] 95 % (12/16 0319) Weight:  [74.8 kg] 74.8 kg (12/15 0938) Physical Exam: General: alert, pleasant, NAD Cardiovascular: RRR no murmurs Respiratory: CTAB normal WOB Abdomen: soft, normoactive BS, TTP in RUQ Extremities: warm, dry. Moving spontaneously   Laboratory: Most recent CBC Lab Results  Component Value Date   WBC 7.5 05/30/2022   HGB 14.5 05/30/2022   HCT 44.5 05/30/2022   MCV 87.8 05/30/2022   PLT 105 (L) 05/30/2022   Most recent BMP    Latest Ref Rng & Units 05/30/2022    4:26 AM  BMP  Glucose 70 - 99 mg/dL 183   BUN 8 - 23 mg/dL 27   Creatinine 0.61 - 1.24 mg/dL 1.62   Sodium 135 - 145 mmol/L 149   Potassium 3.5 - 5.1 mmol/L 4.4   Chloride 98 - 111 mmol/L 111   CO2 22 - 32 mmol/L 28   Calcium 8.9 - 10.3 mg/dL 8.2      Imaging/Diagnostic Tests:  None new   Shary Key, DO 05/30/2022, 7:55 AM  PGY-3, [place] Intern pager: [phone], text pages welcome Secure chat group [place]

## 2022-05-30 NOTE — Progress Notes (Signed)
@   2147 patient informed this RN that he accidentally pulled out his NGT, states "thought he was dreaming".  Patient also states does not want NGT replaced. Call placed to ON-Call service @ [phone] Roselyn Reef) on call Provider ( Dr. Michaelle Birks) to be paged--awaiting call back

## 2022-05-30 NOTE — Progress Notes (Signed)
@   2241 spoke with Dr A. Shelby.Marland KitchenNGT does not need to be replaced at this time..replace for increased N/V, and or increased abdominal distention

## 2022-05-31 ENCOUNTER — Inpatient Hospital Stay (HOSPITAL_COMMUNITY): Payer: Medicare Other

## 2022-05-31 DIAGNOSIS — R739 Hyperglycemia, unspecified: Secondary | ICD-10-CM | POA: Insufficient documentation

## 2022-05-31 DIAGNOSIS — E870 Hyperosmolality and hypernatremia: Secondary | ICD-10-CM | POA: Diagnosis not present

## 2022-05-31 DIAGNOSIS — K56609 Unspecified intestinal obstruction, unspecified as to partial versus complete obstruction: Secondary | ICD-10-CM | POA: Diagnosis not present

## 2022-05-31 DIAGNOSIS — E119 Type 2 diabetes mellitus without complications: Secondary | ICD-10-CM

## 2022-05-31 LAB — BASIC METABOLIC PANEL
Anion gap: 6 (ref 5–15)
Anion gap: 6 (ref 5–15)
BUN: 27 mg/dL — ABNORMAL HIGH (ref 8–23)
BUN: 28 mg/dL — ABNORMAL HIGH (ref 8–23)
CO2: 33 mmol/L — ABNORMAL HIGH (ref 22–32)
CO2: 33 mmol/L — ABNORMAL HIGH (ref 22–32)
Calcium: 8.2 mg/dL — ABNORMAL LOW (ref 8.9–10.3)
Calcium: 8.3 mg/dL — ABNORMAL LOW (ref 8.9–10.3)
Chloride: 109 mmol/L (ref 98–111)
Chloride: 109 mmol/L (ref 98–111)
Creatinine, Ser: 1.6 mg/dL — ABNORMAL HIGH (ref 0.61–1.24)
Creatinine, Ser: 1.52 mg/dL — ABNORMAL HIGH (ref 0.61–1.24)
GFR, Estimated: 44 mL/min — ABNORMAL LOW (ref >60)
GFR, Estimated: 46 mL/min — ABNORMAL LOW (ref >60)
Glucose, Bld: 209 mg/dL — ABNORMAL HIGH (ref 70–99)
Glucose, Bld: 186 mg/dL — ABNORMAL HIGH (ref 70–99)
Potassium: 3.7 mmol/L (ref 3.5–5.1)
Potassium: 3.8 mmol/L (ref 3.5–5.1)
Sodium: 148 mmol/L — ABNORMAL HIGH (ref 135–145)
Sodium: 148 mmol/L — ABNORMAL HIGH (ref 135–145)

## 2022-05-31 LAB — CBC
HCT: 40.4 % (ref 39.0–52.0)
Hemoglobin: 13.7 g/dL (ref 13.0–17.0)
MCH: 29.4 pg (ref 26.0–34.0)
MCHC: 33.9 g/dL (ref 30.0–36.0)
MCV: 86.7 fL (ref 80.0–100.0)
Platelets: ADEQUATE 10*3/uL (ref 150–400)
RBC: 4.66 MIL/uL (ref 4.22–5.81)
RDW: 15.6 % — ABNORMAL HIGH (ref 11.5–15.5)
WBC: 7 10*3/uL (ref 4.0–10.5)
nRBC: 0.3 % — ABNORMAL HIGH (ref 0.0–0.2)

## 2022-05-31 LAB — GLUCOSE, CAPILLARY
Glucose-Capillary: 184 mg/dL — ABNORMAL HIGH (ref 70–99)
Glucose-Capillary: 186 mg/dL — ABNORMAL HIGH (ref 70–99)

## 2022-05-31 MED ORDER — ACETAMINOPHEN 10 MG/ML IV SOLN
1000.0000 mg | Freq: Four times a day (QID) | INTRAVENOUS | Status: AC
  Administered 2022-05-31 – 2022-06-01 (×4): 1000 mg via INTRAVENOUS
  Filled 2022-05-31 (×4): qty 100

## 2022-05-31 MED ORDER — DEXTROSE-NACL 5-0.2 % IV SOLN: INTRAVENOUS | Status: DC

## 2022-05-31 MED ORDER — INSULIN ASPART 100 UNIT/ML IJ SOLN
0.0000 [IU] | Freq: Three times a day (TID) | INTRAMUSCULAR | Status: DC
  Administered 2022-05-31 – 2022-06-01 (×3): 2 [IU] via SUBCUTANEOUS
  Administered 2022-06-01: 1 [IU] via SUBCUTANEOUS
  Administered 2022-06-02: 2 [IU] via SUBCUTANEOUS

## 2022-05-31 NOTE — Progress Notes (Signed)
Encouraged and offered to assist patient ambulate to chair, patient refused. Will continue to encourage. Educated patient to notify staff post void for bladder scan. Verbalized understanding.

## 2022-05-31 NOTE — Progress Notes (Addendum)
     Daily Progress Note Intern Pager: [phone]  Patient name: Edward Newton Medical record number: [id number] Date of birth: 06/23/1940 Age: 81 y.o. Gender: male  Primary Care Provider: Rise Patience, DO Consultants: General surgery  Code Status: Full   Pt Overview and Major Events to Date:  12/13: Admitted to FMTS service; general surgery consulted 12/15: Ex-lap   Assessment and Plan: Edward Newton is a 81 y.o. male presenting with abdominal pain and nausea x 2 days and found to have SBO, now s/p ex lap. PMHx includes T2DM, HTN, and dyslipidemia.   * Small bowel obstruction (HCC) S/p ex-lap resulting small bowel resection with anastomosis on 12/15. Pain mostly controlled post-op. Patient pulled NG overnight in episode of delirium. Upon exam, distended with hypoactive bowel sounds. Continues to be hypernatremic to 148. -General surgery following, appreciate recs -NPO, no NG tube currently  -Pain management: Sch Tylenol IV, Dilaudid 0.'5mg'$  prn breakthrough pain -Change to D5 1/4 NS MIVF -Zofran IV '4mg'$  q6h prn -PM CMP  Acute kidney injury (HCC) Cr 1.6 (baseline Cr ~1.1). Likely prerenal in the setting of decreased PO intake and recent surgery -Strict I&Os -Avoid nephrotoxic agents -On D5 1/2 NS   Hyperglycemia BGL in 200s. A1c 7.4 in 06/2021. -CBG with meals + qhs -SSI  Thrombocytopenia (HCC) Platelets clumped on CBC, appear to be adequate count. Peripheral smear unremarkable. -Lovenox for DVT prophylaxis -Continue to monitor   BPH (benign prostatic hyperplasia) H/o BPH with urinary obstruction. Voiding spontaneously multiple times overnight. -Hold home Flomax due to NPO -D/c bladder scans   FEN/GI: NPO with NG PPx: Lovenox Dispo: Home with [place] pending post-op recovery   Subjective:  States he woke up feeling disoriented last night and pulled out his NG because he thought something was "getting him". States he reoriented and feels more alert this AM. States he  has some pain, feels the current regimen is still working. States he would like to get up and walk and wash up today. Requested ginger ale when able to drink. Spoke with nursing about walking patient as tolerated today.  Objective: Temp:  [98 F (36.7 C)-98.4 F (36.9 C)] 98.4 F (36.9 C) (12/17 0751) Pulse Rate:  [99-109] 109 (12/17 0751) Resp:  [17-18] 18 (12/17 0751) BP: (117-131)/(74-87) 117/87 (12/17 0751) SpO2:  [86 %-96 %] 86 % (12/17 0751) Physical Exam: General: Alert, NAD Cardiovascular: RRR no murmurs Respiratory: CTAB normal WOB Abdomen: Distended, tender to palpation over periumbilical area. Hypoactive bowel sounds. Extremities: Well perfused. No peripheral edema  Laboratory: Most recent CBC Lab Results  Component Value Date   WBC 7.0 05/31/2022   HGB 13.7 05/31/2022   HCT 40.4 05/31/2022   MCV 86.7 05/31/2022   PLT  05/31/2022    PLATELET CLUMPS NOTED ON SMEAR, COUNT APPEARS ADEQUATE   Most recent BMP    Latest Ref Rng & Units 05/31/2022    2:08 AM  BMP  Glucose 70 - 99 mg/dL 209   BUN 8 - 23 mg/dL 27   Creatinine 0.61 - 1.24 mg/dL 1.60   Sodium 135 - 145 mmol/L 148   Potassium 3.5 - 5.1 mmol/L 3.7   Chloride 98 - 111 mmol/L 109   CO2 22 - 32 mmol/L 33   Calcium 8.9 - 10.3 mg/dL 8.2     [name], MD 05/31/2022, 11:50 AM  PGY-1, [place] Intern pager: [phone], text pages welcome Secure chat group [place]

## 2022-05-31 NOTE — Discharge Instructions (Addendum)
Dear Edward Newton,   Thank you for letting us participate in your care! In this section, you will find a brief hospital admission summary of why you were admitted to the hospital, what happened during your admission, your diagnosis/diagnoses, and recommended follow up.  Primary diagnosis: Small bowel obstruction  Treatment plan: You had surgery to remove a portion of your bowel that was causing the obstruction.   POST-HOSPITAL & CARE INSTRUCTIONS We recommend following up with your PCP within 1 week from being discharged from the hospital. Please let PCP/Specialists know of any changes in medications that were made which you will be able to see in the medications section of this packet. Please also follow up with general surgery  DOCTOR'S APPOINTMENTS & FOLLOW UP No future appointments.   Thank you for choosing [facility]! Take care and be well!  [facility]  [place], [place] [address] [phone]

## 2022-05-31 NOTE — Assessment & Plan Note (Addendum)
BGL more controlled in 100s. -Continue 55U insulin in TPN per pharmacy -CBG +  resistant SSI

## 2022-05-31 NOTE — Progress Notes (Signed)
FMTS Brief Progress Note  S: Patient found resting in bed in the dark watching TV.  His only complaint is the abdominal pain.  Reports he would like some pain medicine.  O: BP 129/83 (BP Location: Left Arm)   Pulse (!) 111   Temp 98.2 F (36.8 C) (Oral)   Resp 18   Ht '5\' 10"'$  (1.778 m)   Wt 74.8 kg   SpO2 (!) 89%   BMI 23.68 kg/m   General: Awake, alert, responding appropriately Respiratory: Speaking clearly emphasizes, no respiratory distress Abdomen: Severely distended, tympanic in all quadrants, bowel sounds present all quadrants, overlying midline honeycomb bandage with dark blood inferiorly and slight serous colored staining midway down, TTP in all quadrants  A/P: Currently has Dilaudid 0.5-1 mg ordered every 3 as needed, last received at 1756.  Patient asking for pain medicine 2.5 hours post meds.  Will move up pain medicine slightly.  Must use care given recent reports of delirium. - Continue Dilaudid every 3 as needed - Will start IV Tylenol every 6 x 24 hours - Will try to reduce opioid analgesia is much as possible - Delirium precautions  Ezequiel Essex, MD 05/31/2022, 8:33 PM PGY-3, [place] Night Resident  Please page [phone] with questions.

## 2022-05-31 NOTE — Progress Notes (Signed)
2 Days Post-Op   Subjective/Chief Complaint: Pulled NGT overnight No flatus   Objective: Vital signs in last 24 hours: Temp:  [98 F (36.7 C)-98.4 F (36.9 C)] 98.4 F (36.9 C) (12/17 0751) Pulse Rate:  [93-109] 109 (12/17 0751) Resp:  [16-18] 18 (12/17 0751) BP: (117-131)/(71-87) 117/87 (12/17 0751) SpO2:  [86 %-97 %] 86 % (12/17 0751) Last BM Date : 05/24/22 (pta)  Intake/Output from previous day: 12/16 0701 - 12/17 0700 In: 2798.6 [I.V.:2568.6; NG/GT:30; IV Piggyback:200] Out: 1550 [Urine:1250; Emesis/NG output:300] Intake/Output this shift: No intake/output data recorded.  PE:  Constitutional: No acute distress, conversant, appears states age. Eyes: Anicteric sclerae, moist conjunctiva, no lid lag Lungs: Clear to auscultation bilaterally, normal respiratory effort CV: regular rate and rhythm, no murmurs, no peripheral edema, pedal pulses 2+ GI: distended, Soft, no masses or hepatosplenomegaly, non-tender to palpation Skin: No rashes, palpation reveals normal turgor Psychiatric: appropriate judgment and insight, oriented to person, place, and time   Lab Results:  Recent Labs    05/30/22 0426 05/31/22 0208  WBC 7.5 7.0  HGB 14.5 13.7  HCT 44.5 40.4  PLT 105* PLATELET CLUMPS NOTED ON SMEAR, COUNT APPEARS ADEQUATE   BMET Recent Labs    05/30/22 1921 05/31/22 0208  NA 149* 148*  K 4.0 3.7  CL 112* 109  CO2 31 33*  GLUCOSE 236* 209*  BUN 27* 27*  CREATININE 1.50* 1.60*  CALCIUM 8.2* 8.2*   PT/INR No results for input(s): "LABPROT", "INR" in the last 72 hours. ABG No results for input(s): "PHART", "HCO3" in the last 72 hours.  Invalid input(s): "PCO2", "PO2"  Studies/Results: No results found.  Anti-infectives: Anti-infectives (From admission, onward)    Start     Dose/Rate Route Frequency Ordered Stop   05/29/22 0830  ceFAZolin (ANCEF) IVPB 2g/100 mL premix        2 g 200 mL/hr over 30 Minutes Intravenous  Once 05/29/22 0738 05/29/22 0945        Assessment/Plan: SBO POD 2 ex lap and SBR Dr. Grandville Silos 05/29/22     FEN - cont NPO, NGT, IVF per TRH, ice chips OK, AROBF.  If has n/v will have to replace NGT VTE - SCDs, okay for chem ppx from our standpoint ID - None. Afebrile. No tachycardia or hypotension. WBC 4100   - Per TRH -  HTN HLD DM2 Remote hx parotid cancer (2004)  Incidential findings - Indeterminate right hepatic lobe lesion, right renal lesion and right adrenal lesion. Radiology recommending MRI w/ and w/o contrast to f/u this after sbo has resolved.   LOS: 3 days    Ralene Ok 05/31/2022

## 2022-05-31 NOTE — Plan of Care (Signed)

## 2022-05-31 NOTE — Assessment & Plan Note (Signed)
>>  ASSESSMENT AND PLAN FOR TYPE 2 DIABETES MELLITUS ([place]) WRITTEN ON 06/09/2022  8:31 AM BY DAHBURA, ANTON, DO  BGL in 100-200s. TPN discontinued yesterday. -CBG 4x daily, with meals and at bedtime, -resistant SSI

## 2022-05-31 NOTE — Plan of Care (Signed)
Problem: Education: Goal: Knowledge of General Education information will improve Description: Including pain rating scale, medication(s)/side effects and non-pharmacologic comfort measures 05/31/2022 2304 by Jule Ser, RN Outcome: Progressing 05/31/2022 2301 by Jule Ser, RN Outcome: Progressing   Problem: Health Behavior/Discharge Planning: Goal: Ability to manage health-related needs will improve 05/31/2022 2304 by Jule Ser, RN Outcome: Progressing 05/31/2022 2301 by Jule Ser, RN Outcome: Progressing   Problem: Clinical Measurements: Goal: Ability to maintain clinical measurements within normal limits will improve 05/31/2022 2304 by Jule Ser, RN Outcome: Progressing 05/31/2022 2301 by Jule Ser, RN Outcome: Progressing Goal: Will remain free from infection 05/31/2022 2304 by Jule Ser, RN Outcome: Progressing 05/31/2022 2301 by Jule Ser, RN Outcome: Progressing Goal: Diagnostic test results will improve 05/31/2022 2304 by Jule Ser, RN Outcome: Progressing 05/31/2022 2301 by Jule Ser, RN Outcome: Progressing Goal: Respiratory complications will improve 05/31/2022 2304 by Jule Ser, RN Outcome: Progressing 05/31/2022 2301 by Jule Ser, RN Outcome: Progressing Goal: Cardiovascular complication will be avoided 05/31/2022 2304 by Jule Ser, RN Outcome: Progressing 05/31/2022 2301 by Jule Ser, RN Outcome: Progressing   Problem: Activity: Goal: Risk for activity intolerance will decrease 05/31/2022 2304 by Jule Ser, RN Outcome: Progressing 05/31/2022 2301 by Jule Ser, RN Outcome: Progressing   Problem: Nutrition: Goal: Adequate nutrition will be maintained 05/31/2022 2304 by Jule Ser, RN Outcome: Progressing 05/31/2022 2301 by Jule Ser, RN Outcome: Progressing   Problem: Coping: Goal: Level of anxiety will decrease 05/31/2022  2304 by Jule Ser, RN Outcome: Progressing 05/31/2022 2301 by Jule Ser, RN Outcome: Progressing   Problem: Elimination: Goal: Will not experience complications related to bowel motility 05/31/2022 2304 by Jule Ser, RN Outcome: Progressing 05/31/2022 2301 by Jule Ser, RN Outcome: Progressing Goal: Will not experience complications related to urinary retention 05/31/2022 2304 by Jule Ser, RN Outcome: Progressing 05/31/2022 2301 by Jule Ser, RN Outcome: Progressing   Problem: Pain Managment: Goal: General experience of comfort will improve 05/31/2022 2304 by Jule Ser, RN Outcome: Progressing 05/31/2022 2301 by Jule Ser, RN Outcome: Progressing   Problem: Safety: Goal: Ability to remain free from injury will improve 05/31/2022 2304 by Jule Ser, RN Outcome: Progressing 05/31/2022 2301 by Jule Ser, RN Outcome: Progressing   Problem: Skin Integrity: Goal: Risk for impaired skin integrity will decrease 05/31/2022 2304 by Jule Ser, RN Outcome: Progressing 05/31/2022 2301 by Jule Ser, RN Outcome: Progressing   Problem: Education: Goal: Ability to describe self-care measures that may prevent or decrease complications (Diabetes Survival Skills Education) will improve 05/31/2022 2304 by Jule Ser, RN Outcome: Progressing 05/31/2022 2301 by Jule Ser, RN Outcome: Progressing Goal: Individualized Educational Video(s) 05/31/2022 2304 by Jule Ser, RN Outcome: Progressing 05/31/2022 2301 by Jule Ser, RN Outcome: Progressing   Problem: Coping: Goal: Ability to adjust to condition or change in health will improve 05/31/2022 2304 by Jule Ser, RN Outcome: Progressing 05/31/2022 2301 by Jule Ser, RN Outcome: Progressing   Problem: Fluid Volume: Goal: Ability to maintain a balanced intake and output will improve 05/31/2022 2304 by Jule Ser, RN Outcome: Progressing 05/31/2022 2301 by Jule Ser, RN Outcome: Progressing   Problem: Health Behavior/Discharge Planning: Goal: Ability to identify and utilize available resources and services will improve 05/31/2022 2304 by Jule Ser, RN Outcome: Progressing 05/31/2022 2301 by Jule Ser,  RN Outcome: Progressing Goal: Ability to manage health-related needs will improve 05/31/2022 2304 by Jule Ser, RN Outcome: Progressing 05/31/2022 2301 by Jule Ser, RN Outcome: Progressing   Problem: Metabolic: Goal: Ability to maintain appropriate glucose levels will improve 05/31/2022 2304 by Jule Ser, RN Outcome: Progressing 05/31/2022 2301 by Jule Ser, RN Outcome: Progressing   Problem: Nutritional: Goal: Maintenance of adequate nutrition will improve 05/31/2022 2304 by Jule Ser, RN Outcome: Progressing 05/31/2022 2301 by Jule Ser, RN Outcome: Progressing Goal: Progress toward achieving an optimal weight will improve 05/31/2022 2304 by Jule Ser, RN Outcome: Progressing 05/31/2022 2301 by Jule Ser, RN Outcome: Progressing   Problem: Skin Integrity: Goal: Risk for impaired skin integrity will decrease 05/31/2022 2304 by Jule Ser, RN Outcome: Progressing 05/31/2022 2301 by Jule Ser, RN Outcome: Progressing   Problem: Tissue Perfusion: Goal: Adequacy of tissue perfusion will improve 05/31/2022 2304 by Jule Ser, RN Outcome: Progressing 05/31/2022 2301 by Jule Ser, RN Outcome: Progressing

## 2022-06-01 ENCOUNTER — Inpatient Hospital Stay (HOSPITAL_COMMUNITY): Payer: Medicare Other

## 2022-06-01 DIAGNOSIS — K565 Intestinal adhesions [bands], unspecified as to partial versus complete obstruction: Secondary | ICD-10-CM | POA: Diagnosis not present

## 2022-06-01 LAB — COMPREHENSIVE METABOLIC PANEL
ALT: 14 U/L (ref 0–44)
AST: 19 U/L (ref 15–41)
Albumin: 2.3 g/dL — ABNORMAL LOW (ref 3.5–5.0)
Alkaline Phosphatase: 45 U/L (ref 38–126)
Anion gap: 7 (ref 5–15)
BUN: 28 mg/dL — ABNORMAL HIGH (ref 8–23)
CO2: 31 mmol/L (ref 22–32)
Calcium: 8.2 mg/dL — ABNORMAL LOW (ref 8.9–10.3)
Chloride: 107 mmol/L (ref 98–111)
Creatinine, Ser: 1.3 mg/dL — ABNORMAL HIGH (ref 0.61–1.24)
GFR, Estimated: 56 mL/min — ABNORMAL LOW (ref >60)
Glucose, Bld: 185 mg/dL — ABNORMAL HIGH (ref 70–99)
Potassium: 3.8 mmol/L (ref 3.5–5.1)
Sodium: 145 mmol/L (ref 135–145)
Total Bilirubin: 0.7 mg/dL (ref 0.3–1.2)
Total Protein: 5.6 g/dL — ABNORMAL LOW (ref 6.5–8.1)

## 2022-06-01 LAB — BASIC METABOLIC PANEL
Anion gap: 7 (ref 5–15)
Anion gap: 9 (ref 5–15)
BUN: 24 mg/dL — ABNORMAL HIGH (ref 8–23)
BUN: 21 mg/dL (ref 8–23)
CO2: 31 mmol/L (ref 22–32)
CO2: 31 mmol/L (ref 22–32)
Calcium: 8.1 mg/dL — ABNORMAL LOW (ref 8.9–10.3)
Calcium: 8.2 mg/dL — ABNORMAL LOW (ref 8.9–10.3)
Chloride: 108 mmol/L (ref 98–111)
Chloride: 107 mmol/L (ref 98–111)
Creatinine, Ser: 1.24 mg/dL (ref 0.61–1.24)
Creatinine, Ser: 1.18 mg/dL (ref 0.61–1.24)
GFR, Estimated: 59 mL/min — ABNORMAL LOW (ref >60)
GFR, Estimated: >60 mL/min (ref >60)
Glucose, Bld: 136 mg/dL — ABNORMAL HIGH (ref 70–99)
Glucose, Bld: 114 mg/dL — ABNORMAL HIGH (ref 70–99)
Potassium: 3.5 mmol/L (ref 3.5–5.1)
Potassium: 3.5 mmol/L (ref 3.5–5.1)
Sodium: 146 mmol/L — ABNORMAL HIGH (ref 135–145)
Sodium: 147 mmol/L — ABNORMAL HIGH (ref 135–145)

## 2022-06-01 LAB — GLUCOSE, CAPILLARY
Glucose-Capillary: 181 mg/dL — ABNORMAL HIGH (ref 70–99)
Glucose-Capillary: 132 mg/dL — ABNORMAL HIGH (ref 70–99)
Glucose-Capillary: 94 mg/dL (ref 70–99)
Glucose-Capillary: 136 mg/dL — ABNORMAL HIGH (ref 70–99)

## 2022-06-01 LAB — CBC
HCT: 38.7 % — ABNORMAL LOW (ref 39.0–52.0)
Hemoglobin: 12.6 g/dL — ABNORMAL LOW (ref 13.0–17.0)
MCH: 28.9 pg (ref 26.0–34.0)
MCHC: 32.6 g/dL (ref 30.0–36.0)
MCV: 88.8 fL (ref 80.0–100.0)
Platelets: 137 10*3/uL — ABNORMAL LOW (ref 150–400)
RBC: 4.36 MIL/uL (ref 4.22–5.81)
RDW: 15.7 % — ABNORMAL HIGH (ref 11.5–15.5)
WBC: 7.8 10*3/uL (ref 4.0–10.5)
nRBC: 0.3 % — ABNORMAL HIGH (ref 0.0–0.2)

## 2022-06-01 LAB — MAGNESIUM: Magnesium: 2.5 mg/dL — ABNORMAL HIGH (ref 1.7–2.4)

## 2022-06-01 LAB — SURGICAL PATHOLOGY

## 2022-06-01 MED ORDER — SODIUM CHLORIDE 0.9 % IV BOLUS: 500.0000 mL | Freq: Once | INTRAVENOUS | Status: DC

## 2022-06-01 MED ORDER — ACETAMINOPHEN 10 MG/ML IV SOLN
650.0000 mg | Freq: Four times a day (QID) | INTRAVENOUS | Status: AC
  Administered 2022-06-02 (×4): 650 mg via INTRAVENOUS
  Filled 2022-06-01 (×4): qty 100

## 2022-06-01 MED ORDER — DEXTROSE-NACL 5-0.45 % IV SOLN: INTRAVENOUS | Status: AC

## 2022-06-01 MED ORDER — IOHEXOL 9 MG/ML PO SOLN
500.0000 mL | ORAL | Status: AC
  Administered 2022-06-01: 500 mL via ORAL

## 2022-06-01 MED ORDER — IOHEXOL 350 MG/ML SOLN
75.0000 mL | Freq: Once | INTRAVENOUS | Status: AC | PRN
  Administered 2022-06-01: 75 mL via INTRAVENOUS

## 2022-06-01 MED ORDER — POTASSIUM CHLORIDE 10 MEQ/100ML IV SOLN
10.0000 meq | INTRAVENOUS | Status: AC
  Administered 2022-06-01 (×4): 10 meq via INTRAVENOUS
  Filled 2022-06-01 (×4): qty 100

## 2022-06-01 MED ORDER — SODIUM CHLORIDE 0.9 % IV BOLUS
1000.0000 mL | Freq: Once | INTRAVENOUS | Status: AC
  Administered 2022-06-01: 1000 mL via INTRAVENOUS

## 2022-06-01 NOTE — Progress Notes (Signed)
     Daily Progress Note Intern Pager: [phone]  Patient name: Edward Newton Medical record number: [id number] Date of birth: [birth year] Age: 81 y.o. Gender: male  Primary Care Provider: Rise Patience, DO Consultants: General surgery  Code Status: Full   Pt Overview and Major Events to Date:  12/13: Admitted to FMTS service; general surgery consulted 12/15: Ex-lap resulting in small bowel resection   Assessment and Plan: Edward Newton is a 81 y.o. male presenting with abdominal pain and nausea x 2 days and found to have SBO, now s/p ex lap. PMHx includes T2DM, HTN, and dyslipidemia.   * Small bowel obstruction (HCC) S/p ex-lap resulting small bowel resection with anastomosis on 12/15. Significant distension upon exam. Per gen surg, obtain Ct abdomen with contrast this AM. -General surgery following, appreciate recs -NPO with NG tube to intermittent suction -Pain management: Sch Tylenol IV, Dilaudid 0.'5mg'$  prn breakthrough pain -Change to D51/4 NS MIVF -Zofran IV '4mg'$  q6h prn  Acute kidney injury (HCC) Improving, Cr 1.3 (baseline Cr ~1.1). Likely prerenal in the setting of decreased PO intake and recent surgery -Strict I&Os -Avoid nephrotoxic agents -On D5 1/4 NS   Hyperglycemia BGL in 180s. A1c 7.4 in 06/2021. -CBG with meals + qhs -SSI  Thrombocytopenia (HCC) Plts 137, stable -Lovenox for DVT prophylaxis   Chronic conditions: BPH: hold home Flomax due to NPO  FEN/GI: NPO with NG PPx: Lovenox Dispo: Home with [place] pending post-op recovery   Subjective:  Patient assessed at bedside, states "I want to eat". States his pain feels mostly controlled with current regimen. Denies N/V, NG with copious output. States he might have passed gas but not sure but denies BM.  Objective: Temp:  [98 F (36.7 C)-98.4 F (36.9 C)] 98.2 F (36.8 C) (12/18 0732) Pulse Rate:  [102-111] 105 (12/18 0732) Resp:  [14-19] 14 (12/18 0732) BP: (125-146)/(82-89) 125/82 (12/18  0732) SpO2:  [89 %-94 %] 93 % (12/18 0321) Physical Exam: General: Alert, NAD Cardiovascular: Tachycardia. Regular rhythm. No murmurs Respiratory: CTAB normal WOB Abdomen: Soft, distended, tender to palpation over midline. Absent bowel sounds. Central dressing saturated with red-brown drainage.  Extremities: Well perfused. No peripheral edema  Laboratory: Most recent CBC Lab Results  Component Value Date   WBC 7.8 06/01/2022   HGB 12.6 (L) 06/01/2022   HCT 38.7 (L) 06/01/2022   MCV 88.8 06/01/2022   PLT 137 (L) 06/01/2022   Most recent BMP    Latest Ref Rng & Units 06/01/2022    3:21 AM  BMP  Glucose 70 - 99 mg/dL 185   BUN 8 - 23 mg/dL 28   Creatinine 0.61 - 1.24 mg/dL 1.30   Sodium 135 - 145 mmol/L 145   Potassium 3.5 - 5.1 mmol/L 3.8   Chloride 98 - 111 mmol/L 107   CO2 22 - 32 mmol/L 31   Calcium 8.9 - 10.3 mg/dL 8.2     Imaging/Diagnostic Tests: DG Abd Portable 1V Result Date: 05/31/2022 IMPRESSION: 1. Side port and distal tip of the NG tube terminates in the stomach. 2. Dilated loops of small bowel are identified in the upper abdomen.   [name], MD 06/01/2022, 9:12 AM  PGY-1, [place] Intern pager: [phone], text pages welcome Secure chat group [place]

## 2022-06-01 NOTE — Plan of Care (Signed)
  Problem: Education: Goal: Knowledge of General Education information will improve Description: Including pain rating scale, medication(s)/side effects and non-pharmacologic comfort measures Outcome: Progressing   Problem: Clinical Measurements: Goal: Ability to maintain clinical measurements within normal limits will improve Outcome: Progressing   Problem: Activity: Goal: Risk for activity intolerance will decrease Outcome: Progressing   Problem: Coping: Goal: Level of anxiety will decrease Outcome: Progressing   Problem: Safety: Goal: Ability to remain free from injury will improve Outcome: Progressing   Problem: Nutrition: Goal: Adequate nutrition will be maintained Outcome: Not Progressing   Problem: Pain Managment: Goal: General experience of comfort will improve Outcome: Not Progressing

## 2022-06-01 NOTE — Progress Notes (Signed)
FMTS Brief Progress Note  S: Night rounds. Patient found awake in bed watching tv. Reports abdominal pain "isn't too bad" at this time. He has received IV tylenol 1,000 mg q6 scheduled since 12/13 '@2026'$ . No robaxin given during this time. Has required dilaudid 1 mg x3 today, last '@1649'$ .   Patient quite thirsty and requesting sprite to drink.   O: BP 137/81 (BP Location: Left Arm)   Pulse (!) 109   Temp (!) 97.4 F (36.3 C) (Oral)   Resp 18   Ht '5\' 10"'$  (1.778 m)   Wt 74.8 kg   SpO2 94%   BMI 23.68 kg/m   Gen: awake, alert, pleasant, NAD Abd: midline honeycomb bandage no longer present, replaced with gauze secured with tape - serosanguinous drainage noted over inferior portion of bandage; tympanic abd in all quadrants, abd wall feels slightly softer to palpation than last night, mod TTP in all quadrants, no peritoneal signs or rebound tenderness, bowel sounds present in all quadrants   A/P: Given continued distended abdomen and SBO, will continue with scheduled pain control. Plan to reduce tylenol dose and incorporate more robaxin PRN. Overall goal to use dilaudid and other opiates as little as possible given concern for delirium.  - tylenol 650 mg IV q6 scheduled - robaxin q6 PRN first line for abdominal pain - dilaudid 0.5-1 mg q3 PRN as second line  Ezequiel Essex, MD 06/01/2022, 8:58 PM PGY-3, [facility] Night Resident  Please page [phone] with questions.

## 2022-06-01 NOTE — Progress Notes (Signed)
Subjective No acute events. Soreness about his incision. Denies flatus or BM. Denies n/v with NG in palce  Objective: Vital signs in last 24 hours: Temp:  [98 F (36.7 C)-98.4 F (36.9 C)] 98.2 F (36.8 C) (12/18 0732) Pulse Rate:  [102-111] 105 (12/18 0732) Resp:  [14-19] 14 (12/18 0732) BP: (125-146)/(82-89) 125/82 (12/18 0732) SpO2:  [89 %-94 %] 93 % (12/18 0338) Last BM Date : 05/24/22 (pta)  Intake/Output from previous day: 12/17 0701 - 12/18 0700 In: 1100.2 [I.V.:1000; IV Piggyback:100.2] Out: 1300 [Urine:300; Emesis/NG output:1000] Intake/Output this shift: No intake/output data recorded.  Gen: NAD, comfortable CV: RRR Pulm: Normal work of breathing Abd: Soft, midline incisional tenderness, no tenderness laterally; moderately distended; honecomb with inferior stained with foul smelling drainage Ext: SCDs in place  Lab Results: CBC  Recent Labs    05/31/22 0208 06/01/22 0321  WBC 7.0 7.8  HGB 13.7 12.6*  HCT 40.4 38.7*  PLT PLATELET CLUMPS NOTED ON SMEAR, COUNT APPEARS ADEQUATE 137*   BMET Recent Labs    05/31/22 1440 06/01/22 0321  NA 148* 145  K 3.8 3.8  CL 109 107  CO2 33* 31  GLUCOSE 186* 185*  BUN 28* 28*  CREATININE 1.52* 1.30*  CALCIUM 8.3* 8.2*   PT/INR No results for input(s): "LABPROT", "INR" in the last 72 hours. ABG No results for input(s): "PHART", "HCO3" in the last 72 hours.  Invalid input(s): "PCO2", "PO2"  Studies/Results:  Anti-infectives: Anti-infectives (From admission, onward)    Start     Dose/Rate Route Frequency Ordered Stop   05/29/22 0830  ceFAZolin (ANCEF) IVPB 2g/100 mL premix        2 g 200 mL/hr over 30 Minutes Intravenous  Once 05/29/22 0738 05/29/22 0945        Assessment/Plan: Patient Active Problem List   Diagnosis Date Noted   Hyperglycemia 05/31/2022   Hypernatremia 05/30/2022   Generalized abdominal pain 05/27/2022   Small bowel obstruction ([place]) 05/27/2022   Acute kidney injury ([place])  05/27/2022   SBO (small bowel obstruction) ([place]) 05/27/2022   Thrombocytopenia ([place]) 05/27/2022   Primary osteoarthritis of left hip 02/18/2022   Osteoarthritis of left hip 02/12/2022   Cellulitis of left pinna 12/26/2020   Healthcare maintenance 06/25/2019   Type 2 diabetes mellitus, controlled ([place]) 12/05/2015   Hearing problem 07/06/2012   Hyperlipidemia 11/20/2008   ERECTILE DYSFUNCTION, 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 [place] Silos 05/29/22     FEN - cont NPO, NGT, IVF per [facility], AROBF VTE - SCDs, okay for chem ppx from our standpoint ID - None. Afebrile  HR elevated for past 24 hrs, in low 100s; drainage from midline on POD#3; Afebrile with no peritonitis on exam, WBC normal. Will proceed with CT abdomen/pelvis for further evaluation - discussed with Arjun bedside nurse regarding this plan - oral contrast can be given via NG tube, noted we would like CT completed this morning.  NG has had a liter of bilious fluid; Cr mildly elevated; 300 cc documented of UOP yesterday, unclear per nursing overnight urine output - resuscitate with additional 1L bolus NS ordered   - Per TR[facility] -  HTN HLD DM2 Remote hx parotid cancer (2004)  Incidential findings - Indeterminate right hepatic lobe lesion, right renal lesion and right adrenal lesion. Radiology recommending MRI w/ and w/o contrast to f/u this after sbo has resolved.  LOS: 4 days   Nadeen Landau, MD [facility] Surgery, [place]

## 2022-06-01 NOTE — Progress Notes (Signed)
Patient ambulated in hallway about 200 ft. Patient tolerated ambulation well.

## 2022-06-01 NOTE — Care Management Important Message (Signed)
Important Message  Patient Details  Name: Edward Newton MRN: [id number] Date of Birth: March 26, 1941   Medicare Important Message Given:  Yes     Orbie Pyo 06/01/2022, 3:25 PM

## 2022-06-01 NOTE — Progress Notes (Signed)
Physical Therapy Treatment Patient Details Name: Edward Newton MRN: [id number] DOB: 03/22/1941 Today's Date: 06/01/2022   History of Present Illness 81 y.o. male admitted 12/13 with abdominal pain and nausea found to have SBO. s/p small bowel resection 12/15. PMHx: HTN,  BPH,  Right knee OA , HLD,  T2DM,  Primary OA left hip.    PT Comments    Pt reporting abdominal discomfort and discomfort related to NGT, but agreeable to OOB mobility. Pt ambulatory for hallway distance with use of RW, pt struggling most physically with bed mobility and upright posture during gait all secondary to abdominal pain. Pt progressing well, PT encouraged OOB and ambulating working up to 6x/day with staff assist. Pt expresses understanding.      Recommendations for follow up therapy are one component of a multi-disciplinary discharge planning process, led by the attending physician.  Recommendations may be updated based on patient status, additional functional criteria and insurance authorization.  Follow Up Recommendations  Home health PT     Assistance Recommended at Discharge Intermittent Supervision/Assistance  Patient can return home with the following A little help with walking and/or transfers;A little help with bathing/dressing/bathroom;Assistance with cooking/housework;Help with stairs or ramp for entrance   Equipment Recommendations  Rolling walker (2 wheels)    Recommendations for Other Services       Precautions / Restrictions Precautions Precautions: Fall Restrictions Weight Bearing Restrictions: No     Mobility  Bed Mobility Overal bed mobility: Needs Assistance Bed Mobility: Supine to Sit     Supine to sit: Min assist, HOB elevated     General bed mobility comments: assist for rolling trunk, LE lowering over EOB. cues for sequencing    Transfers Overall transfer level: Needs assistance Equipment used: Rolling walker (2 wheels) Transfers: Sit to/from Stand Sit to Stand:  Min guard           General transfer comment: for safety, cues for hand placement when rising/sitting    Ambulation/Gait Ambulation/Gait assistance: Min guard Gait Distance (Feet): 150 Feet Assistive device: Rolling walker (2 wheels) Gait Pattern/deviations: Step-through pattern, Decreased stride length, Narrow base of support Gait velocity: decr     General Gait Details: cues for upright posture multiple times during session, no physical assist   Stairs             Wheelchair Mobility    Modified Rankin (Stroke Patients Only)       Balance Overall balance assessment: Needs assistance Sitting-balance support: No upper extremity supported, Feet supported Sitting balance-Leahy Scale: Good     Standing balance support: No upper extremity supported Standing balance-Leahy Scale: Fair                              Cognition Arousal/Alertness: Awake/alert Behavior During Therapy: WFL for tasks assessed/performed Overall Cognitive Status: Within Functional Limits for tasks assessed                                          Exercises Other Exercises Other Exercises: ankle pumps while up in chair    General Comments        Pertinent Vitals/Pain Pain Assessment Pain Assessment: Faces Faces Pain Scale: Hurts little more Pain Location: upper abdomen Pain Descriptors / Indicators: Aching, Sore Pain Intervention(s): Limited activity within patient's tolerance, Monitored during session, Repositioned    Home Living  Prior Function            PT Goals (current goals can now be found in the care plan section) Acute Rehab PT Goals Patient Stated Goal: go home PT Goal Formulation: With patient Time For Goal Achievement: 06/13/22 Potential to Achieve Goals: Good Progress towards PT goals: Progressing toward goals    Frequency    Min 3X/week      PT Plan Current plan remains appropriate     Co-evaluation              AM-PAC PT "6 Clicks" Mobility   Outcome Measure  Help needed turning from your back to your side while in a flat bed without using bedrails?: A Little Help needed moving from lying on your back to sitting on the side of a flat bed without using bedrails?: A Little Help needed moving to and from a bed to a chair (including a wheelchair)?: A Little Help needed standing up from a chair using your arms (e.g., wheelchair or bedside chair)?: A Little Help needed to walk in hospital room?: A Little Help needed climbing 3-5 steps with a railing? : A Lot 6 Click Score: 17    End of Session   Activity Tolerance: Patient tolerated treatment well Patient left: in chair;with call bell/phone within reach;with chair alarm set;with family/visitor present Nurse Communication: Mobility status PT Visit Diagnosis: Unsteadiness on feet (R26.81);Other abnormalities of gait and mobility (R26.89);Muscle weakness (generalized) (M62.81);Difficulty in walking, not elsewhere classified (R26.2);Pain     Time: 1340-1400 PT Time Calculation (min) (ACUTE ONLY): 20 min  Charges:  $Gait Training: 8-22 mins                     Stacie Glaze, PT DPT Acute Rehabilitation Services Pager [phone]  Office [phone]   [place] 06/01/2022, 4:40 PM

## 2022-06-02 ENCOUNTER — Inpatient Hospital Stay: Payer: Self-pay

## 2022-06-02 DIAGNOSIS — E870 Hyperosmolality and hypernatremia: Secondary | ICD-10-CM | POA: Diagnosis not present

## 2022-06-02 DIAGNOSIS — G4734 Idiopathic sleep related nonobstructive alveolar hypoventilation: Secondary | ICD-10-CM | POA: Diagnosis not present

## 2022-06-02 DIAGNOSIS — D696 Thrombocytopenia, unspecified: Secondary | ICD-10-CM

## 2022-06-02 DIAGNOSIS — R059 Cough, unspecified: Secondary | ICD-10-CM

## 2022-06-02 DIAGNOSIS — K56609 Unspecified intestinal obstruction, unspecified as to partial versus complete obstruction: Secondary | ICD-10-CM | POA: Diagnosis not present

## 2022-06-02 LAB — BASIC METABOLIC PANEL
Anion gap: 7 (ref 5–15)
BUN: 19 mg/dL (ref 8–23)
CO2: 32 mmol/L (ref 22–32)
Calcium: 8.4 mg/dL — ABNORMAL LOW (ref 8.9–10.3)
Chloride: 107 mmol/L (ref 98–111)
Creatinine, Ser: 1.21 mg/dL (ref 0.61–1.24)
GFR, Estimated: >60 mL/min (ref >60)
Glucose, Bld: 185 mg/dL — ABNORMAL HIGH (ref 70–99)
Potassium: 3.7 mmol/L (ref 3.5–5.1)
Sodium: 146 mmol/L — ABNORMAL HIGH (ref 135–145)

## 2022-06-02 LAB — GLUCOSE, CAPILLARY
Glucose-Capillary: 152 mg/dL — ABNORMAL HIGH (ref 70–99)
Glucose-Capillary: 163 mg/dL — ABNORMAL HIGH (ref 70–99)
Glucose-Capillary: 180 mg/dL — ABNORMAL HIGH (ref 70–99)
Glucose-Capillary: 185 mg/dL — ABNORMAL HIGH (ref 70–99)
Glucose-Capillary: 212 mg/dL — ABNORMAL HIGH (ref 70–99)

## 2022-06-02 LAB — MAGNESIUM: Magnesium: 2.5 mg/dL — ABNORMAL HIGH (ref 1.7–2.4)

## 2022-06-02 LAB — CBC
HCT: 38.2 % — ABNORMAL LOW (ref 39.0–52.0)
Hemoglobin: 12.9 g/dL — ABNORMAL LOW (ref 13.0–17.0)
MCH: 29.5 pg (ref 26.0–34.0)
MCHC: 33.8 g/dL (ref 30.0–36.0)
MCV: 87.4 fL (ref 80.0–100.0)
Platelets: UNDETERMINED 10*3/uL (ref 150–400)
RBC: 4.37 MIL/uL (ref 4.22–5.81)
RDW: 15.7 % — ABNORMAL HIGH (ref 11.5–15.5)
WBC: 10 10*3/uL (ref 4.0–10.5)
nRBC: 0 % (ref 0.0–0.2)

## 2022-06-02 MED ORDER — INSULIN ASPART 100 UNIT/ML IJ SOLN
0.0000 [IU] | Freq: Four times a day (QID) | INTRAMUSCULAR | Status: DC
  Administered 2022-06-02: 3 [IU] via SUBCUTANEOUS
  Administered 2022-06-02 – 2022-06-03 (×2): 2 [IU] via SUBCUTANEOUS

## 2022-06-02 MED ORDER — SODIUM CHLORIDE 0.9% FLUSH
10.0000 mL | Freq: Two times a day (BID) | INTRAVENOUS | Status: DC
  Administered 2022-06-02: 40 mL
  Administered 2022-06-03 – 2022-06-09 (×12): 10 mL

## 2022-06-02 MED ORDER — SODIUM CHLORIDE 0.45 % IV SOLN: INTRAVENOUS | Status: DC

## 2022-06-02 MED ORDER — TRAVASOL 10 % IV SOLN
INTRAVENOUS | Status: AC
  Filled 2022-06-02: qty 756

## 2022-06-02 MED ORDER — SODIUM CHLORIDE 0.9% FLUSH: 10.0000 mL | INTRAVENOUS | Status: DC | PRN

## 2022-06-02 MED ORDER — POTASSIUM CHLORIDE 10 MEQ/100ML IV SOLN
10.0000 meq | INTRAVENOUS | Status: AC
  Administered 2022-06-02 (×4): 10 meq via INTRAVENOUS
  Filled 2022-06-02 (×4): qty 100

## 2022-06-02 MED ORDER — CHLORHEXIDINE GLUCONATE CLOTH 2 % EX PADS
6.0000 | MEDICATED_PAD | Freq: Every day | CUTANEOUS | Status: DC
  Administered 2022-06-02 – 2022-06-09 (×8): 6 via TOPICAL

## 2022-06-02 MED ORDER — BENZONATATE 100 MG PO CAPS
100.0000 mg | ORAL_CAPSULE | Freq: Three times a day (TID) | ORAL | Status: DC | PRN
  Administered 2022-06-03: 100 mg via ORAL
  Filled 2022-06-02 (×2): qty 1

## 2022-06-02 NOTE — Progress Notes (Addendum)
Daily Progress Note Intern Pager: [phone]  Patient name: Edward Newton Medical record number: [id number] Date of birth: [birth year] Age: 81 y.o. Gender: male  Primary Care Provider: Rise Patience, DO Consultants: General surgery  Code Status: Full   Pt Overview and Major Events to Date:  12/13: Admitted to FMTS service; general surgery consulted 12/15: Ex-lap resulting in small bowel resection   Assessment and Plan: Edward Newton is a 81 y.o. male presenting with abdominal pain and nausea x 2 days and found to have SBO, now s/p ex lap. PMHx includes T2DM, HTN, and dyslipidemia.   * S/P small bowel resection Distension improving and passing gas. Pain mostly controlled. CT abdomen showed potential secondary obstruction vs ileus. Per general surgery, no further intervention at this time. In discussion with surgery, patient needs nutritional support. Mild hypernatremia to 146, likely due to intravascular depletion. Some persistent tachycardia, will monitor for pain and infection. Surgical pathology showed benign tissue. Voiding appropriately without signs of urinary retention. -General surgery following, appreciate recs -NPO with NG tube to intermittent suction -Place PICC line -Start TPN given prolonged NPO -Pain management: Sch Tylenol IV, Methocarbamol prn, Dilaudid 0.'5mg'$  prn breakthrough pain -D51/2 NS MIVF -Monitor fever curver  Acute kidney injury ([place]) Continues to trend down, Cr 1.2 (baseline Cr ~1.1). -Continue D5 1/2 NS for hydration  Nocturnal hypoxemia Intermittently put on [place] for comfort overnight. CXR showed opacities in LLL consistent with atalectasis. Likely due to poor lung expansion in the setting of abdominal pain. -IS  Type 2 diabetes mellitus (HCC) Glucose ranging from 110-180s. -CBG with meals + qhs -SSI   Chronic conditions: BPH: hold home Flomax due to NPO   FEN/GI: NPO with NG PPx: Lovenox Dispo: Home with [place] pending post-op recovery    Subjective:  Patient assessed at bedside with daughter present. States he wants to eat or drink something. States "something with flavor, salt or pepper". States he is passing gas but has not had a BM. Denies N/V. States he got up to walk twice yesterday and would like to do so again today.  Objective: Temp:  [97.4 F (36.3 C)-100 F (37.8 C)] 98.7 F (37.1 C) (12/19 0909) Pulse Rate:  [94-109] 94 (12/19 0909) Resp:  [15-18] 17 (12/19 0909) BP: (137-162)/(74-86) 162/80 (12/19 0909) SpO2:  [92 %-95 %] 95 % (12/19 0909) Physical Exam: General: Alert, NAD Cardiovascular: Tachycardia. Regular rhythm. No murmurs Respiratory: CTAB normal WOB Abdomen: Soft. Improving distension. Tender to palpation over RLQ. Clean dressing over midline incision. Extremities: Well perfused. No peripheral edema  Laboratory: Most recent CBC Lab Results  Component Value Date   WBC 10.0 06/02/2022   HGB 12.9 (L) 06/02/2022   HCT 38.2 (L) 06/02/2022   MCV 87.4 06/02/2022   PLT PLATELET CLUMPS NOTED ON SMEAR, UNABLE TO ESTIMATE 06/02/2022   Most recent BMP    Latest Ref Rng & Units 06/02/2022    3:07 AM  BMP  Glucose 70 - 99 mg/dL 185   BUN 8 - 23 mg/dL 19   Creatinine 0.61 - 1.24 mg/dL 1.21   Sodium 135 - 145 mmol/L 146   Potassium 3.5 - 5.1 mmol/L 3.7   Chloride 98 - 111 mmol/L 107   CO2 22 - 32 mmol/L 32   Calcium 8.9 - 10.3 mg/dL 8.4     Other pertinent labs: Mag: 2.5 Surgical pathology: benign tissue  Imaging/Diagnostic Tests: CT ABDOMEN PELVIS W CONTRAST Result Date: 06/01/2022 IMPRESSION: 1. Status post small bowel resection with reanastomosis  in the ventral left hemiabdomen. 2. Persistently fluid-filled, mildly distended loops of distal small bowel in the ventral and low abdomen distal to the anastomosis measuring 4.2 cm in caliber. Suspect an additional transition point in the left hemiabdomen near the anastomosis concerning for persistent small bowel obstruction, although some  component of postoperative ileus could explain this finding. Fluoroscopic interrogation may be helpful. 3. Small volume ascites throughout the abdomen and pelvis. Minimal postoperative pneumoperitoneum in the ventral abdomen. No specific findings to suggest anastomotic leak. As above, fluoroscopic interrogation or oral contrast administration may be helpful to more specifically assess for contrast leak if clinically suspected. 4. Unchanged soft tissue attenuation subcapsular lesion of the posterior right lobe of the liver, hepatic segment VII measuring 1.9 x 1.3 cm. This is incompletely characterized and as previously reported, may be further characterized by nonemergent, outpatient multiphasic contrast enhanced CT or MRI. 5. Unchanged intermediate attenuation lesion of the inferior pole of the right kidney measuring 1.0 cm, incompletely characterized and possibly a hemorrhagic or proteinaceous cyst or solid renal lesion. This could be further characterized in conjunction with study above. 6. Unchanged soft tissue attenuation right adrenal nodule measuring 1.5 x 1.2 cm, statistically almost certainly a benign adenoma. This could be further characterize in conjunction with study above. 7. Severe prostatomegaly. 8. Small bilateral pleural effusions and associated atelectasis or consolidation. Aortic Atherosclerosis (ICD10-I70.0).  DG CHEST PORT 1 VIEW Result Date: 06/01/2022 IMPRESSION: Streaky opacities at the LEFT lung base, overlying an elevated LEFT hemidiaphragm, atelectasis versus pneumonia.  [name], MD 06/02/2022, 11:26 AM  PGY-1, [facility] Intern pager: [phone], text pages welcome Secure chat group [place]

## 2022-06-02 NOTE — Progress Notes (Signed)
FMTS Brief Progress Note  S: Patient has any complaints.  Says he occasionally has nausea.  He does endorse abdominal pain when he coughs.  He said cough started yesterday.  He denies shortness of breath or chest pain.  Denies fevers or chills.   O: BP (!) 140/81 (BP Location: Left Arm)   Pulse 94   Temp 98.9 F (37.2 C) (Oral)   Resp 18   Ht '5\' 10"'$  (1.778 m)   Wt 74.8 kg   SpO2 91%   BMI 23.68 kg/m   General: In no acute distress, with NGT in place CV: Tachycardic but regular well-perfused Respiratory: No increased work of breathing on room air, faint rhonchi, 500 cc on incentive spirometer Abdomen: Distended but improved compared to prior exam, soft, no tenderness to palpation  A/P: Patient improved in terms of SBO compared to prior.  His abdominal exam is improved and he is passing flatus.  In terms of cough could be in the setting of atelectasis.  Patient is currently afebrile without leukocytosis hypoxia.  Chest x-ray shows streaky left opacities concerning for either atelectasis or pneumonia.  No concern for pneumonia at this time -Tessalon Perles for cough - Continue to monitor vital signs - Continue NGT - Orders reviewed. Labs for AM ordered, which was adjusted as needed.  - If condition changes, plan includes contact general surgery.   Lowry Ram, MD 06/02/2022, 10:45 PM PGY-1, [facility] Night Resident  Please page [phone] with questions.

## 2022-06-02 NOTE — Progress Notes (Signed)
Peripherally Inserted Central Catheter Placement  The IV Nurse has discussed with the patient and/or persons authorized to consent for the patient, the purpose of this procedure and the potential benefits and risks involved with this procedure.  The benefits include less needle sticks, lab draws from the catheter, and the patient may be discharged home with the catheter. Risks include, but not limited to, infection, bleeding, blood clot (thrombus formation), and puncture of an artery; nerve damage and irregular heartbeat and possibility to perform a PICC exchange if needed/ordered by physician.  Alternatives to this procedure were also discussed.  Bard Power PICC patient education guide, fact sheet on infection prevention and patient information card has been provided to patient /or left at bedside.    PICC Placement Documentation  PICC Double Lumen 06/02/22 Right Brachial 41 cm 1 cm (Active)  Indication for Insertion or Continuance of Line Administration of hyperosmolar/irritating solutions (i.e. TPN, Vancomycin, etc.) 06/02/22 1500  Exposed Catheter (cm) 1 cm 06/02/22 1500  Site Assessment Clean, Dry, Intact 06/02/22 1500  Lumen #1 Status Flushed;Saline locked;Blood return noted 06/02/22 1500  Lumen #2 Status Flushed;Saline locked;Blood return noted 06/02/22 1500  Dressing Type Transparent;Securing device 06/02/22 1500  Dressing Status Antimicrobial disc in place;Clean, Dry, Intact 06/02/22 1500  Safety Lock Not Applicable 86/75/44 9201  Line Adjustment (NICU/IV Team Only) No 06/02/22 1500  Dressing Intervention New dressing;Other (Comment) 06/02/22 1500  Dressing Change Due 06/09/22 06/02/22 1500       Enos Fling 06/02/2022, 3:10 PM

## 2022-06-02 NOTE — Progress Notes (Signed)
PHARMACY - TOTAL PARENTERAL NUTRITION CONSULT NOTE   Indication: Small bowel obstruction  Patient Measurements: Height: 5' 10" (177.8 cm) Weight: 74.8 kg (165 lb) IBW/kg (Calculated) : 73 TPN AdjBW (KG): 74.8 Body mass index is 23.68 kg/m. Usual Weight: 75-77 kg  Assessment:  81 yo man with persistent SBO after exploratory laparotomy and small bowel resection on 12/15. Patient has had poor PO intake for > 9 days. Patient has been receiving maintenance fluid with more than 100g dextrose per day, which decreases risk for refeeding syndrome. Pharmacy consulted for TPN.   Glucose / Insulin: BG < 180, A1C 7.4, used 3 units SSI last 24 hrs Electrolytes: Na 146, K 3.7 (40 mEq IV ordered), Mg 2.5, CoCa 9.7 others wnl Renal: Scr 1.21 (BL 1.1), BUN wnl  Hepatic: AST/ALT/Alk phos/Tbili wnl, Albumin 2.3, TG pending Intake / Output; MIVF: on D5-halfNS _0  ml/hr; UOP 1.3 ml/kg/hr, NGT 1769m, LBM PTA, +flatus GI Imaging: 12/13 CT: high-grade small-bowel obstruction  12/14 KUB: Ongoing small bowel obstruction.  12/15 KUB: No improvement in small-bowel obstruction since 05/27/2022 12/17 KUB: Dilated loops of small bowel  12/18 CT: concerning for persistent SBO or ileus,  No specific findings to suggest anastomotic leak GI Surgeries / Procedures:  12/15  ex lap and SBR   Central access: ordered 12/19 TPN start date: 12/19   Nutritional Goals: Goal TPN rate is 100 mL/hr (provides 101 g of protein, 276 g dextrose and 1918 kcals per day)   RD Assessment: Estimated Needs Total Energy Estimated Needs: 1825-2190 kcals Total Protein Estimated Needs: 90-110 gm Total Fluid Estimated Needs: >/= 1.8 L  Current Nutrition:  NPO    Plan:  Start TPN at 75 mL/hr at 1800, provides 76g protein and 1438 kcal, meeting ~75% of estimated needs Electrolytes in TPN: Na 50 mEq/L, K 30 mEq/L, Ca 2 mEq/L, Mg 0 mEq/L, and Phos 10 mmol/L. Cl:Ac 2:1 Add standard MVI and trace elements to TPN Initiate Sensitive  q6h SSI and adjust as needed  Change MIVF to half NS at 25 mL/hr at 1800 Monitor TPN labs daily until stable at goal then on Mon/Thurs  LBenetta Spar PharmD, BCPS, BEnglewoodPharmacist  Please check AMION for all [place] numbers After 10:00 PM, call [place]

## 2022-06-02 NOTE — Progress Notes (Signed)
Mobility Specialist Progress Note    06/02/22 1154  Mobility  Activity Ambulated with assistance in hallway  Level of Assistance Contact guard assist, steadying assist  Assistive Device Front wheel walker  Distance Ambulated (ft) 390 ft  Activity Response Tolerated well  Mobility Referral Yes  $Mobility charge 1 Mobility   Pt received sitting EOB and agreeable. No complaints on walk. Returned to sitting EOB with call bell in reach.    Hildred Alamin Mobility Specialist  Please Psychologist, sport and exercise or Rehab Office at [phone]

## 2022-06-02 NOTE — Progress Notes (Signed)
Nutrition Follow-up  DOCUMENTATION CODES:   Not applicable  INTERVENTION:  - Initiate TPN per pharmacy management.   NUTRITION DIAGNOSIS:   Inadequate oral intake related to inability to eat as evidenced by NPO status.  GOAL:   Provide needs based on ASPEN/SCCM guidelines  MONITOR:   PO intake, Diet advancement  REASON FOR ASSESSMENT:   Malnutrition Screening Tool    ASSESSMENT:   81 y.o. male admits related to abdominal pain and nausea. PMH includes: T2DM, HTN, dyslipidemia. Pt is currently receiving medical management for SBO.  Meds reviewed: sliding scale insulin. Labs reviewed: Na high; Mag low.   MD consult to initiate TPN. Pt has been NPO x6 days. TPN starting today per pharmacy management. RD will continue to monitor POC and diet advancement.   Diet Order:   Diet Order             Diet NPO time specified  Diet effective now                   EDUCATION NEEDS:   Not appropriate for education at this time  Skin:  Skin Assessment: Reviewed RN Assessment  Last BM:  05/24/22  Height:   Ht Readings from Last 1 Encounters:  05/29/22 '5\' 10"'$  (1.778 m)    Weight:   Wt Readings from Last 1 Encounters:  05/29/22 74.8 kg    Ideal Body Weight:     BMI:  Body mass index is 23.68 kg/m.  Estimated Nutritional Needs:   Kcal:  1825-2190 kcals  Protein:  90-110 gm  Fluid:  >/= 1.8 L  Thalia Bloodgood, RD, LDN, CNSC.

## 2022-06-02 NOTE — Progress Notes (Signed)
[facility] Progress Note  4 Days Post-Op  Subjective: CC-  States that his abdominal pain is a little less today. Denies n/v. Passing more flatus, no BM. NG with 1750cc bilious output. He got OOB twice yesterday. Wants to ambulate more today.  Objective: Vital signs in last 24 hours: Temp:  [97.4 F (36.3 C)-100 F (37.8 C)] 98.7 F (37.1 C) (12/19 0909) Pulse Rate:  [94-109] 94 (12/19 0909) Resp:  [15-18] 17 (12/19 0909) BP: (137-162)/(74-86) 162/80 (12/19 0909) SpO2:  [92 %-95 %] 95 % (12/19 0909) Last BM Date : 05/24/22 (pta)  Intake/Output from previous day: 12/18 0701 - 12/19 0700 In: 2232.8 [P.O.:100; I.V.:1351.1; NG/GT:120; IV Piggyback:661.7] Out: 4020 [Urine:2270; Emesis/NG output:1750] Intake/Output this shift: Total I/O In: -  Out: 250 [Urine:100; Emesis/NG output:150]  PE: Gen:  Alert, NAD, pleasant Pulm:  rate and effort normal Abd: Soft, distended, appropriately tender over incision, midline incision cdi with staples present and no erythema or drainage/ overlying dressing also clean  Lab Results:  Recent Labs    06/01/22 0321 06/02/22 0303  WBC 7.8 10.0  HGB 12.6* 12.9*  HCT 38.7* 38.2*  PLT 137* PLATELET CLUMPS NOTED ON SMEAR, UNABLE TO ESTIMATE   BMET Recent Labs    06/01/22 1658 06/02/22 0307  NA 147* 146*  K 3.5 3.7  CL 107 107  CO2 31 32  GLUCOSE 114* 185*  BUN 21 19  CREATININE 1.18 1.21  CALCIUM 8.2* 8.4*   PT/INR No results for input(s): "LABPROT", "INR" in the last 72 hours. CMP     Component Value Date/Time   NA 146 (H) 06/02/2022 0307   NA 140 06/25/2021 0945   K 3.7 06/02/2022 0307   CL 107 06/02/2022 0307   CO2 32 06/02/2022 0307   GLUCOSE 185 (H) 06/02/2022 0307   BUN 19 06/02/2022 0307   BUN 16 06/25/2021 0945   CREATININE 1.21 06/02/2022 0307   CREATININE 1.27 (H) 05/14/2016 1534   CALCIUM 8.4 (L) 06/02/2022 0307   PROT 5.6 (L) 06/01/2022 0321   PROT 6.8 02/02/2020 1017   ALBUMIN 2.3 (L) 06/01/2022  0321   ALBUMIN 4.1 02/02/2020 1017   AST 19 06/01/2022 0321   ALT 14 06/01/2022 0321   ALKPHOS 45 06/01/2022 0321   BILITOT 0.7 06/01/2022 0321   BILITOT 0.7 02/02/2020 1017   GFRNONAA >60 06/02/2022 0307   GFRNONAA 56 (L) 05/14/2016 1534   GFRAA 58 (L) 02/02/2020 1017   GFRAA 64 05/14/2016 1534   Lipase     Component Value Date/Time   LIPASE 23 05/27/2022 1009       Studies/Results: CT ABDOMEN PELVIS W CONTRAST  Result Date: 06/01/2022 CLINICAL DATA:  Abdominal pain, status post small-bowel resection, follow-up foul-smelling wound drainage, evaluate for leak EXAM: CT ABDOMEN AND PELVIS WITH CONTRAST TECHNIQUE: Multidetector CT imaging of the abdomen and pelvis was performed using the standard protocol following bolus administration of intravenous contrast. RADIATION DOSE REDUCTION: This exam was performed according to the departmental dose-optimization program which includes automated exposure control, adjustment of the mA and/or kV according to patient size and/or use of iterative reconstruction technique. CONTRAST:  80m OMNIPAQUE IOHEXOL 350 MG/ML SOLN COMPARISON:  05/27/2022 FINDINGS: Lower chest: Small bilateral pleural effusions and associated atelectasis or consolidation. Hepatobiliary: Unchanged soft tissue attenuation subcapsular lesion of the posterior right lobe of the liver, hepatic segment VII measuring 1.9 x 1.3 cm (series 3, image 18). No gallstones, gallbladder wall thickening, or biliary dilatation. Pancreas: Unremarkable. No pancreatic ductal dilatation  or surrounding inflammatory changes. Spleen: Normal in size without significant abnormality. Adrenals/Urinary Tract: Unchanged soft tissue attenuation right adrenal nodule measuring 1.5 x 1.2 cm (series 3, image 22). Unchanged intermediate attenuation lesion of the posterior inferior pole of the right kidney measuring 1.0 cm (series 3, image 37). No calculi or hydronephrosis. Bladder is unremarkable. Stomach/Bowel: Stomach  is within normal limits. Status post small bowel resection in the ventral left hemiabdomen with reanastomosis. Persistently fluid-filled, mildly distended loops of distal small bowel in the ventral and low abdomen distal to the anastomosis measuring 4.2 cm in caliber (series 3, image 75). Suspect an additional transition point in the left hemiabdomen near the anastomosis (series 3, image 56, series 6, image 44). Descending and sigmoid diverticulosis. Vascular/Lymphatic: Aortic atherosclerosis. No enlarged abdominal or pelvic lymph nodes. Reproductive: Severe prostatomegaly. Other: No abdominal wall hernia. Status post midline laparotomy. Small volume ascites throughout the abdomen pelvis. Minimal postoperative pneumoperitoneum in the ventral abdomen (series 3, image 42). Anasarca. Musculoskeletal: No acute or significant osseous findings. IMPRESSION: 1. Status post small bowel resection with reanastomosis in the ventral left hemiabdomen. 2. Persistently fluid-filled, mildly distended loops of distal small bowel in the ventral and low abdomen distal to the anastomosis measuring 4.2 cm in caliber. Suspect an additional transition point in the left hemiabdomen near the anastomosis concerning for persistent small bowel obstruction, although some component of postoperative ileus could explain this finding. Fluoroscopic interrogation may be helpful. 3. Small volume ascites throughout the abdomen and pelvis. Minimal postoperative pneumoperitoneum in the ventral abdomen. No specific findings to suggest anastomotic leak. As above, fluoroscopic interrogation or oral contrast administration may be helpful to more specifically assess for contrast leak if clinically suspected. 4. Unchanged soft tissue attenuation subcapsular lesion of the posterior right lobe of the liver, hepatic segment VII measuring 1.9 x 1.3 cm. This is incompletely characterized and as previously reported, may be further characterized by nonemergent,  outpatient multiphasic contrast enhanced CT or MRI. 5. Unchanged intermediate attenuation lesion of the inferior pole of the right kidney measuring 1.0 cm, incompletely characterized and possibly a hemorrhagic or proteinaceous cyst or solid renal lesion. This could be further characterized in conjunction with study above. 6. Unchanged soft tissue attenuation right adrenal nodule measuring 1.5 x 1.2 cm, statistically almost certainly a benign adenoma. This could be further characterize in conjunction with study above. 7. Severe prostatomegaly. 8. Small bilateral pleural effusions and associated atelectasis or consolidation. Aortic Atherosclerosis (ICD10-I70.0). Electronically Signed   By: Delanna Ahmadi M.D.   On: 06/01/2022 13:16   DG CHEST PORT 1 VIEW  Result Date: 06/01/2022 CLINICAL DATA:  Hypoxia EXAM: PORTABLE CHEST 1 VIEW COMPARISON:  Chest x-ray dated 04/20/2021. FINDINGS: Enteric tube appears well positioned in the stomach. Heart size and mediastinal contours are within normal limits. Streaky opacities at the LEFT lung base, overlying an elevated LEFT hemidiaphragm. Lungs otherwise clear. No pleural effusion or pneumothorax is seen. IMPRESSION: Streaky opacities at the LEFT lung base, overlying an elevated LEFT hemidiaphragm, atelectasis versus pneumonia. Electronically Signed   By: Franki Cabot M.D.   On: 06/01/2022 10:56   DG Abd Portable 1V  Result Date: 05/31/2022 CLINICAL DATA:  Impaired gastric feeding tube EXAM: PORTABLE ABDOMEN - 1 VIEW COMPARISON:  May 29, 2022 FINDINGS: Side port and distal tip of the NG tube terminates in the stomach. The stomach is mildly distended with gas. Dilated loops of small bowel are are identified throughout the upper abdomen. No other changes. IMPRESSION: 1. Side port and distal  tip of the NG tube terminates in the stomach. 2. Dilated loops of small bowel are identified in the upper abdomen. Electronically Signed   By: Dorise Bullion III M.D.   On:  05/31/2022 16:56    Anti-infectives: Anti-infectives (From admission, onward)    Start     Dose/Rate Route Frequency Ordered Stop   05/29/22 0830  ceFAZolin (ANCEF) IVPB 2g/100 mL premix        2 g 200 mL/hr over 30 Minutes Intravenous  Once 05/29/22 0738 05/29/22 0945        Assessment/Plan SBO POD #4 s/p ex lap and SBR Dr. Grandville Silos 05/29/22 - CT 12/18 with no signs of anastomotic leak - Continue NPO/NGT to LIWS and await return in bowel function - Mobilize, continue PT  FEN - NPO, NGT, IVF VTE - SCDs, lovenox ID - None   - Per TRH -  HTN HLD DM2 Remote hx parotid cancer (2004)  Incidential findings - Indeterminate right hepatic lobe lesion, right renal lesion and right adrenal lesion. Radiology recommending MRI w/ and w/o contrast to f/u this after sbo has resolved.     LOS: 5 days    [facility] 06/02/2022, 9:25 AM Please see Amion for pager number during day hours 7:00am-4:30pm

## 2022-06-02 NOTE — Assessment & Plan Note (Deleted)
Intermittent cough, likely secondary to NG irritation per patient. -Continue Flonase for nasal irritation

## 2022-06-03 DIAGNOSIS — K56609 Unspecified intestinal obstruction, unspecified as to partial versus complete obstruction: Secondary | ICD-10-CM | POA: Diagnosis not present

## 2022-06-03 LAB — TRIGLYCERIDES: Triglycerides: 147 mg/dL (ref <150)

## 2022-06-03 LAB — GLUCOSE, CAPILLARY
Glucose-Capillary: 193 mg/dL — ABNORMAL HIGH (ref 70–99)
Glucose-Capillary: 231 mg/dL — ABNORMAL HIGH (ref 70–99)
Glucose-Capillary: 190 mg/dL — ABNORMAL HIGH (ref 70–99)

## 2022-06-03 LAB — COMPREHENSIVE METABOLIC PANEL
ALT: 82 U/L — ABNORMAL HIGH (ref 0–44)
AST: 104 U/L — ABNORMAL HIGH (ref 15–41)
Albumin: 2.2 g/dL — ABNORMAL LOW (ref 3.5–5.0)
Alkaline Phosphatase: 55 U/L (ref 38–126)
Anion gap: 5 (ref 5–15)
BUN: 16 mg/dL (ref 8–23)
CO2: 32 mmol/L (ref 22–32)
Calcium: 8.3 mg/dL — ABNORMAL LOW (ref 8.9–10.3)
Chloride: 111 mmol/L (ref 98–111)
Creatinine, Ser: 1.08 mg/dL (ref 0.61–1.24)
GFR, Estimated: >60 mL/min (ref >60)
Glucose, Bld: 188 mg/dL — ABNORMAL HIGH (ref 70–99)
Potassium: 3.8 mmol/L (ref 3.5–5.1)
Sodium: 148 mmol/L — ABNORMAL HIGH (ref 135–145)
Total Bilirubin: 0.5 mg/dL (ref 0.3–1.2)
Total Protein: 5.6 g/dL — ABNORMAL LOW (ref 6.5–8.1)

## 2022-06-03 LAB — PHOSPHORUS: Phosphorus: 2.9 mg/dL (ref 2.5–4.6)

## 2022-06-03 LAB — CBC
HCT: 36.2 % — ABNORMAL LOW (ref 39.0–52.0)
Hemoglobin: 11.5 g/dL — ABNORMAL LOW (ref 13.0–17.0)
MCH: 28.3 pg (ref 26.0–34.0)
MCHC: 31.8 g/dL (ref 30.0–36.0)
MCV: 88.9 fL (ref 80.0–100.0)
RBC: 4.07 MIL/uL — ABNORMAL LOW (ref 4.22–5.81)
RDW: 16 % — ABNORMAL HIGH (ref 11.5–15.5)
WBC: 12.2 10*3/uL — ABNORMAL HIGH (ref 4.0–10.5)
nRBC: 0.2 % (ref 0.0–0.2)

## 2022-06-03 LAB — MAGNESIUM: Magnesium: 2.4 mg/dL (ref 1.7–2.4)

## 2022-06-03 LAB — SODIUM: Sodium: 150 mmol/L — ABNORMAL HIGH (ref 135–145)

## 2022-06-03 MED ORDER — DEXTROSE-NACL 5-0.2 % IV SOLN
INTRAVENOUS | Status: DC
  Filled 2022-06-03: qty 1000

## 2022-06-03 MED ORDER — ACETAMINOPHEN 10 MG/ML IV SOLN
650.0000 mg | Freq: Four times a day (QID) | INTRAVENOUS | Status: AC
  Administered 2022-06-03 – 2022-06-04 (×4): 650 mg via INTRAVENOUS
  Filled 2022-06-03 (×4): qty 100

## 2022-06-03 MED ORDER — INSULIN ASPART 100 UNIT/ML IJ SOLN
0.0000 [IU] | Freq: Four times a day (QID) | INTRAMUSCULAR | Status: DC
  Administered 2022-06-03: 5 [IU] via SUBCUTANEOUS
  Administered 2022-06-03: 3 [IU] via SUBCUTANEOUS
  Administered 2022-06-04: 8 [IU] via SUBCUTANEOUS
  Administered 2022-06-04: 5 [IU] via SUBCUTANEOUS

## 2022-06-03 MED ORDER — TRAVASOL 10 % IV SOLN
INTRAVENOUS | Status: AC
  Filled 2022-06-03: qty 1008

## 2022-06-03 NOTE — Progress Notes (Signed)
Physical Therapy Treatment Patient Details Name: Edward Newton MRN: [id number] DOB: [birth year] Today's Date: 06/03/2022   History of Present Illness 81 y.o. male admitted 12/13 with abdominal pain and nausea found to have SBO. s/p small bowel resection 12/15. PMHx: HTN,  BPH,  Right knee OA , HLD,  T2DM,  Primary OA left hip.    PT Comments    Patient received on [facility] in room. He is agreeable to PT session. Patient required RN assist to clamp NG tube. Assisted patient in changing gown and clean up from [facility]. He transfers with min guard, ambulated 300 feet with RW and min guard/supervision. Patient without lob. He ambulates at good pace. He will continue to benefit from skilled PT to improve functional independence and safety with mobility.      Recommendations for follow up therapy are one component of a multi-disciplinary discharge planning process, led by the attending physician.  Recommendations may be updated based on patient status, additional functional criteria and insurance authorization.  Follow Up Recommendations  Home health PT     Assistance Recommended at Discharge Intermittent Supervision/Assistance  Patient can return home with the following A little help with bathing/dressing/bathroom;Assistance with cooking/housework;Assist for transportation;Help with stairs or ramp for entrance;A little help with walking and/or transfers   Equipment Recommendations  Rolling walker (2 wheels)    Recommendations for Other Services       Precautions / Restrictions Precautions Precaution Comments: low fall Restrictions Weight Bearing Restrictions: No Other Position/Activity Restrictions: NG to suction     Mobility  Bed Mobility               General bed mobility comments: NT, patient on BSC on arrival and returned to [facility] after walking    Transfers Overall transfer level: Needs assistance Equipment used: Rolling walker (2 wheels) Transfers: Sit to/from Stand Sit to  Stand: Min guard           General transfer comment: for safety, cues for hand placement when rising/sitting    Ambulation/Gait Ambulation/Gait assistance: Min guard, Supervision Gait Distance (Feet): 300 Feet Assistive device: Rolling walker (2 wheels) Gait Pattern/deviations: Step-through pattern Gait velocity: WNL     General Gait Details: cues for upright posture during session, supervision   Stairs             Wheelchair Mobility    Modified Rankin (Stroke Patients Only)       Balance Overall balance assessment: Modified Independent Sitting-balance support: Feet supported Sitting balance-Leahy Scale: Normal     Standing balance support: Bilateral upper extremity supported, During functional activity, Reliant on assistive device for balance Standing balance-Leahy Scale: Good Standing balance comment: Prefers to lean on RW for support due to guarding.                            Cognition Arousal/Alertness: Awake/alert Behavior During Therapy: Flat affect Overall Cognitive Status: Within Functional Limits for tasks assessed                                          Exercises      General Comments        Pertinent Vitals/Pain Pain Assessment Pain Assessment: Faces Faces Pain Scale: Hurts a little bit Breathing: normal Negative Vocalization: none Facial Expression: smiling or inexpressive Body Language: relaxed Consolability: no need to console PAINAD Score:  0 Pain Location: upper abdomen Pain Descriptors / Indicators: Discomfort, Sore Pain Intervention(s): Monitored during session    Home Living                          Prior Function            PT Goals (current goals can now be found in the care plan section) Acute Rehab PT Goals Patient Stated Goal: go home PT Goal Formulation: With patient Time For Goal Achievement: 06/13/22 Potential to Achieve Goals: Good Progress towards PT goals:  Progressing toward goals    Frequency    Min 3X/week      PT Plan Current plan remains appropriate    Co-evaluation              AM-PAC PT "6 Clicks" Mobility   Outcome Measure  Help needed turning from your back to your side while in a flat bed without using bedrails?: A Little Help needed moving from lying on your back to sitting on the side of a flat bed without using bedrails?: A Little Help needed moving to and from a bed to a chair (including a wheelchair)?: A Little Help needed standing up from a chair using your arms (e.g., wheelchair or bedside chair)?: A Little Help needed to walk in hospital room?: A Little Help needed climbing 3-5 steps with a railing? : A Little 6 Click Score: 18    End of Session Equipment Utilized During Treatment: Gait belt Activity Tolerance: Patient tolerated treatment well Patient left: Other (comment) (on BSC, RN Notified) Nurse Communication: Mobility status PT Visit Diagnosis: Muscle weakness (generalized) (M62.81);Pain Pain - part of body:  (abdomen)     Time: 6384-5364 PT Time Calculation (min) (ACUTE ONLY): 22 min  Charges:  $Gait Training: 8-22 mins                     Wilbert Schouten, PT, GCS 06/03/22,11:52 AM

## 2022-06-03 NOTE — Progress Notes (Signed)
[facility] Progress Note  5 Days Post-Op  Subjective: CC-  Feels about the same. Continues to have some abdominal pain but it is well controlled and no worse. Denies n/v. Passing flatus, no BM. NG with 1175cc bilious output. WBC slightly up today, TMAX 99.8. denies dysuria, CP, SOB, cough.  Objective: Vital signs in last 24 hours: Temp:  [98.7 F (37.1 C)-99.8 F (37.7 C)] 99.8 F (37.7 C) (12/20 0811) Pulse Rate:  [94-96] 94 (12/20 0811) Resp:  [17-18] 18 (12/20 0811) BP: (139-162)/(75-82) 139/82 (12/20 0811) SpO2:  [91 %-100 %] 100 % (12/20 0811) Last BM Date : 05/24/22 (pta)  Intake/Output from previous day: 12/19 0701 - 12/20 0700 In: 40 [NG/GT:40] Out: 2450 [Urine:1275; Emesis/NG output:1175] Intake/Output this shift: No intake/output data recorded.  PE: Gen:  Alert, NAD, pleasant Pulm:  rate and effort normal Abd: Soft, mild distension, appropriately tender over incision, midline incision with staples present and no erythema or induration/ some bloody drainaged noted on dressing  Lab Results:  Recent Labs    06/02/22 0303 06/03/22 0247  WBC 10.0 12.2*  HGB 12.9* 11.5*  HCT 38.2* 36.2*  PLT PLATELET CLUMPS NOTED ON SMEAR, UNABLE TO ESTIMATE PLATELET CLUMPS NOTED ON SMEAR   BMET Recent Labs    06/02/22 0307 06/03/22 0247  NA 146* 148*  K 3.7 3.8  CL 107 111  CO2 32 32  GLUCOSE 185* 188*  BUN 19 16  CREATININE 1.21 1.08  CALCIUM 8.4* 8.3*   PT/INR No results for input(s): "LABPROT", "INR" in the last 72 hours. CMP     Component Value Date/Time   NA 148 (H) 06/03/2022 0247   NA 140 06/25/2021 0945   K 3.8 06/03/2022 0247   CL 111 06/03/2022 0247   CO2 32 06/03/2022 0247   GLUCOSE 188 (H) 06/03/2022 0247   BUN 16 06/03/2022 0247   BUN 16 06/25/2021 0945   CREATININE 1.08 06/03/2022 0247   CREATININE 1.27 (H) 05/14/2016 1534   CALCIUM 8.3 (L) 06/03/2022 0247   PROT 5.6 (L) 06/03/2022 0247   PROT 6.8 02/02/2020 1017   ALBUMIN 2.2  (L) 06/03/2022 0247   ALBUMIN 4.1 02/02/2020 1017   AST 104 (H) 06/03/2022 0247   ALT 82 (H) 06/03/2022 0247   ALKPHOS 55 06/03/2022 0247   BILITOT 0.5 06/03/2022 0247   BILITOT 0.7 02/02/2020 1017   GFRNONAA >60 06/03/2022 0247   GFRNONAA 56 (L) 05/14/2016 1534   GFRAA 58 (L) 02/02/2020 1017   GFRAA 64 05/14/2016 1534   Lipase     Component Value Date/Time   LIPASE 23 05/27/2022 1009       Studies/Results: [place] EKG SITE RITE  Result Date: 06/02/2022 If Site Rite image not attached, placement could not be confirmed due to current cardiac rhythm.  CT ABDOMEN PELVIS W CONTRAST  Result Date: 06/01/2022 CLINICAL DATA:  Abdominal pain, status post small-bowel resection, follow-up foul-smelling wound drainage, evaluate for leak EXAM: CT ABDOMEN AND PELVIS WITH CONTRAST TECHNIQUE: Multidetector CT imaging of the abdomen and pelvis was performed using the standard protocol following bolus administration of intravenous contrast. RADIATION DOSE REDUCTION: This exam was performed according to the departmental dose-optimization program which includes automated exposure control, adjustment of the mA and/or kV according to patient size and/or use of iterative reconstruction technique. CONTRAST:  52m OMNIPAQUE IOHEXOL 350 MG/ML SOLN COMPARISON:  05/27/2022 FINDINGS: Lower chest: Small bilateral pleural effusions and associated atelectasis or consolidation. Hepatobiliary: Unchanged soft tissue attenuation subcapsular lesion of the posterior  right lobe of the liver, hepatic segment VII measuring 1.9 x 1.3 cm (series 3, image 18). No gallstones, gallbladder wall thickening, or biliary dilatation. Pancreas: Unremarkable. No pancreatic ductal dilatation or surrounding inflammatory changes. Spleen: Normal in size without significant abnormality. Adrenals/Urinary Tract: Unchanged soft tissue attenuation right adrenal nodule measuring 1.5 x 1.2 cm (series 3, image 22). Unchanged intermediate attenuation lesion  of the posterior inferior pole of the right kidney measuring 1.0 cm (series 3, image 37). No calculi or hydronephrosis. Bladder is unremarkable. Stomach/Bowel: Stomach is within normal limits. Status post small bowel resection in the ventral left hemiabdomen with reanastomosis. Persistently fluid-filled, mildly distended loops of distal small bowel in the ventral and low abdomen distal to the anastomosis measuring 4.2 cm in caliber (series 3, image 75). Suspect an additional transition point in the left hemiabdomen near the anastomosis (series 3, image 56, series 6, image 44). Descending and sigmoid diverticulosis. Vascular/Lymphatic: Aortic atherosclerosis. No enlarged abdominal or pelvic lymph nodes. Reproductive: Severe prostatomegaly. Other: No abdominal wall hernia. Status post midline laparotomy. Small volume ascites throughout the abdomen pelvis. Minimal postoperative pneumoperitoneum in the ventral abdomen (series 3, image 42). Anasarca. Musculoskeletal: No acute or significant osseous findings. IMPRESSION: 1. Status post small bowel resection with reanastomosis in the ventral left hemiabdomen. 2. Persistently fluid-filled, mildly distended loops of distal small bowel in the ventral and low abdomen distal to the anastomosis measuring 4.2 cm in caliber. Suspect an additional transition point in the left hemiabdomen near the anastomosis concerning for persistent small bowel obstruction, although some component of postoperative ileus could explain this finding. Fluoroscopic interrogation may be helpful. 3. Small volume ascites throughout the abdomen and pelvis. Minimal postoperative pneumoperitoneum in the ventral abdomen. No specific findings to suggest anastomotic leak. As above, fluoroscopic interrogation or oral contrast administration may be helpful to more specifically assess for contrast leak if clinically suspected. 4. Unchanged soft tissue attenuation subcapsular lesion of the posterior right lobe of  the liver, hepatic segment VII measuring 1.9 x 1.3 cm. This is incompletely characterized and as previously reported, may be further characterized by nonemergent, outpatient multiphasic contrast enhanced CT or MRI. 5. Unchanged intermediate attenuation lesion of the inferior pole of the right kidney measuring 1.0 cm, incompletely characterized and possibly a hemorrhagic or proteinaceous cyst or solid renal lesion. This could be further characterized in conjunction with study above. 6. Unchanged soft tissue attenuation right adrenal nodule measuring 1.5 x 1.2 cm, statistically almost certainly a benign adenoma. This could be further characterize in conjunction with study above. 7. Severe prostatomegaly. 8. Small bilateral pleural effusions and associated atelectasis or consolidation. Aortic Atherosclerosis (ICD10-I70.0). Electronically Signed   By: Delanna Ahmadi M.D.   On: 06/01/2022 13:16   DG CHEST PORT 1 VIEW  Result Date: 06/01/2022 CLINICAL DATA:  Hypoxia EXAM: PORTABLE CHEST 1 VIEW COMPARISON:  Chest x-ray dated 04/20/2021. FINDINGS: Enteric tube appears well positioned in the stomach. Heart size and mediastinal contours are within normal limits. Streaky opacities at the LEFT lung base, overlying an elevated LEFT hemidiaphragm. Lungs otherwise clear. No pleural effusion or pneumothorax is seen. IMPRESSION: Streaky opacities at the LEFT lung base, overlying an elevated LEFT hemidiaphragm, atelectasis versus pneumonia. Electronically Signed   By: Franki Cabot M.D.   On: 06/01/2022 10:56    Anti-infectives: Anti-infectives (From admission, onward)    Start     Dose/Rate Route Frequency Ordered Stop   05/29/22 0830  ceFAZolin (ANCEF) IVPB 2g/100 mL premix  2 g 200 mL/hr over 30 Minutes Intravenous  Once 05/29/22 0738 05/29/22 0945        Assessment/Plan SBO POD #5 s/p ex lap and SBR Dr. Grandville Silos 05/29/22 - CT 12/18 with no signs of anastomotic leak - WBC slightly up today, TMAX 99.8.  monitor - Continue NPO/NGT to LIWS and await return in bowel function - Mobilize, continue PT   FEN - NPO, NGT, IVF VTE - SCDs, lovenox ID - None   - Per TRH -  HTN HLD DM2 Remote hx parotid cancer (2004)  Incidential findings - Indeterminate right hepatic lobe lesion, right renal lesion and right adrenal lesion. Radiology recommending MRI w/ and w/o contrast to f/u this after sbo has resolved.     LOS: 6 days    [facility] 06/03/2022, 8:56 AM Please see Amion for pager number during day hours 7:00am-4:30pm

## 2022-06-03 NOTE — Plan of Care (Signed)

## 2022-06-03 NOTE — Progress Notes (Signed)
PHARMACY - TOTAL PARENTERAL NUTRITION CONSULT NOTE   Indication: Small bowel obstruction  Patient Measurements: Height: _0  (177.8 cm) Weight: 74.8 kg (165 lb) IBW/kg (Calculated) : 73 TPN AdjBW (KG): 74.8 Body mass index is 23.68 kg/m. Usual Weight: 75-77 kg  Assessment:  81 yo man with persistent SBO after exploratory laparotomy and small bowel resection on 12/15. Patient has had poor PO intake for > 9 days. Patient was receiving maintenance fluid with more than 100g dextrose per day prior to TPN initiation, which decreases risk for refeeding syndrome. Pharmacy consulted for TPN.   Glucose / Insulin: BG < 220, A1C 7.4, used 9 units SSI last 24 hrs Electrolytes: Na 148, CoCa 9.7 others wnl Renal: Scr 1.08 (BL 1.1), BUN wnl  Hepatic: AST/ALT trending up to 104/82, Alk phos/Tbili wnl, Albumin 2.2, TG pending Intake / Output; MIVF: on 1/2NS _1  ml/hr; UOP 0.7 ml/kg/hr, NGT 1152m, LBM PTA, +flatus GI Imaging: 12/13 CT: high-grade small-bowel obstruction  12/14 KUB: Ongoing small bowel obstruction.  12/15 KUB: No improvement in small-bowel obstruction since 05/27/2022 12/17 KUB: Dilated loops of small bowel  12/18 CT: concerning for persistent SBO or ileus,  No specific findings to suggest anastomotic leak GI Surgeries / Procedures:  12/15  ex lap and SBR   Central access: PICC placed 12/19 TPN start date: 12/19   Nutritional Goals: Goal TPN rate is 100 mL/hr (provides 101 g of protein, 276 g dextrose and 1918 kcals per day)   RD Assessment: Estimated Needs Total Energy Estimated Needs: 1825-2190 kcals Total Protein Estimated Needs: 90-110 gm Total Fluid Estimated Needs: >/= 1.8 L  Current Nutrition:  NPO and TPN    Plan:  Advance TPN to goal rate of 100 mL/hr at 1800, provides 101g protein and 1918 kcal, meeting ~100% of estimated needs Electrolytes in TPN: Na 38 mEq/L, K 30 mEq/L, Ca 2 mEq/L, Mg 0 mEq/L, and Phos 12 mmol/L. Cl:Ac Max Cl Add standard MVI and trace  elements to TPN Change to Moderate q6h SSI and adjust as needed  Discontinue MIVF at 1800 Monitor TPN labs daily until stable at goal then on Mon/Thurs  CLuisa Hart PharmD, BCPS Clinical Pharmacist 06/03/2022 7:42 AM   Please [facility] to [facility] phone number

## 2022-06-03 NOTE — Progress Notes (Signed)
     Daily Progress Note Intern Pager: [phone]  Patient name: Edward Newton Medical record number: [id number] Date of birth: Nov 09, 1940 Age: 81 y.o. Gender: male  Primary Care Provider: Rise Patience, DO Consultants: General surgery  Code Status: Full   Pt Overview and Major Events to Date:  12/13: Admitted to FMTS service; general surgery consulted 12/15: Ex-lap resulting in small bowel resection   Assessment and Plan: Edward Newton is a 81 y.o. male presenting with abdominal pain and nausea x 2 days and found to have SBO, now s/p ex lap. PMHx includes T2DM, HTN, and dyslipidemia.   * S/P small bowel resection Patient continues to clinically improve, only mild abdominal distension and passing gas. Tachycardia resolving and patient remains afebrile. Now on TPN, likely only need for a couple days given improvement. Some persistent hypernatremia to 148, likely due to intravascular depletion and losses from copious NG output. Other electrolytes remain stable. -General surgery following, appreciate recs -NPO with NG tube to intermittent suction -TPN per pharmacy -Pain management: Sch Tylenol IV, Methocarbamol prn, Dilaudid 0.5-'1mg'$  prn breakthrough pain -PM Na -Repeat CMP in Am given mild elevation in LFTs -Consider adding D51/4NS if persistent hypernatremia  Nocturnal hypoxemia Breathing comfortably on RA overnight and sating well. Encouraged regular ambulation and IS use.  Type 2 diabetes mellitus (HCC) Glucose continue to be elevated to 180-190s. -CBG checks -Change to mSSI as indicated  Acute kidney injury (HCC)-resolved as of 06/03/2022 Cr now at baseline.   Chronic conditions: BPH: hold home Flomax due to NPO   FEN/GI: NPO with NG PPx: Lovenox Dispo: Home with [place] pending post-op recovery   Subjective:  Patient assessed at bedside, states he feels improved. Requested more lollipops as he still feels hungry and thirsty. States he had ambulated once already this AM  and felt well. Goal to ambulate two more times today. Denies N/V. States he is passing gas and feels like he could stool soon. Continues to have copious bilious output.  Objective: Temp:  [98.9 F (37.2 C)-99.8 F (37.7 C)] 99.8 F (37.7 C) (12/20 0811) Pulse Rate:  [94-96] 94 (12/20 0811) Resp:  [18] 18 (12/20 0811) BP: (139-140)/(75-82) 139/82 (12/20 0811) SpO2:  [91 %-100 %] 100 % (12/20 0017) Physical Exam: General: Alert, NAD Cardiovascular: RRR. No murmurs Respiratory: CTAB normal WOB Abdomen: Soft. Improving and mild distension. Non-tender to palpation. Midline dressing saturated with red-brown drainage. Extremities: Well perfused. No peripheral edema  Laboratory: Most recent CBC Lab Results  Component Value Date   WBC 12.2 (H) 06/03/2022   HGB 11.5 (L) 06/03/2022   HCT 36.2 (L) 06/03/2022   MCV 88.9 06/03/2022   PLT PLATELET CLUMPS NOTED ON SMEAR 06/03/2022   Most recent BMP    Latest Ref Rng & Units 06/03/2022    2:47 AM  BMP  Glucose 70 - 99 mg/dL 188   BUN 8 - 23 mg/dL 16   Creatinine 0.61 - 1.24 mg/dL 1.08   Sodium 135 - 145 mmol/L 148   Potassium 3.5 - 5.1 mmol/L 3.8   Chloride 98 - 111 mmol/L 111   CO2 22 - 32 mmol/L 32   Calcium 8.9 - 10.3 mg/dL 8.3     Other pertinent labs: AST: 104 ALT: 82   [name], MD 06/03/2022, 11:48 AM  PGY-1, [place] Intern pager: [phone], text pages welcome Secure chat group [place]

## 2022-06-04 DIAGNOSIS — Z9049 Acquired absence of other specified parts of digestive tract: Secondary | ICD-10-CM | POA: Diagnosis not present

## 2022-06-04 LAB — GLUCOSE, CAPILLARY
Glucose-Capillary: 219 mg/dL — ABNORMAL HIGH (ref 70–99)
Glucose-Capillary: 287 mg/dL — ABNORMAL HIGH (ref 70–99)
Glucose-Capillary: 232 mg/dL — ABNORMAL HIGH (ref 70–99)
Glucose-Capillary: 204 mg/dL — ABNORMAL HIGH (ref 70–99)

## 2022-06-04 LAB — CBC
HCT: 35.9 % — ABNORMAL LOW (ref 39.0–52.0)
Hemoglobin: 11.5 g/dL — ABNORMAL LOW (ref 13.0–17.0)
MCH: 28.6 pg (ref 26.0–34.0)
MCHC: 32 g/dL (ref 30.0–36.0)
MCV: 89.3 fL (ref 80.0–100.0)
Platelets: 156 10*3/uL (ref 150–400)
RBC: 4.02 MIL/uL — ABNORMAL LOW (ref 4.22–5.81)
RDW: 15.9 % — ABNORMAL HIGH (ref 11.5–15.5)
WBC: 11.6 10*3/uL — ABNORMAL HIGH (ref 4.0–10.5)
nRBC: 0 % (ref 0.0–0.2)

## 2022-06-04 LAB — COMPREHENSIVE METABOLIC PANEL
ALT: 95 U/L — ABNORMAL HIGH (ref 0–44)
AST: 90 U/L — ABNORMAL HIGH (ref 15–41)
Albumin: 2.2 g/dL — ABNORMAL LOW (ref 3.5–5.0)
Alkaline Phosphatase: 62 U/L (ref 38–126)
Anion gap: 8 (ref 5–15)
BUN: 15 mg/dL (ref 8–23)
CO2: 31 mmol/L (ref 22–32)
Calcium: 8.8 mg/dL — ABNORMAL LOW (ref 8.9–10.3)
Chloride: 109 mmol/L (ref 98–111)
Creatinine, Ser: 1.03 mg/dL (ref 0.61–1.24)
GFR, Estimated: >60 mL/min (ref >60)
Glucose, Bld: 249 mg/dL — ABNORMAL HIGH (ref 70–99)
Potassium: 3.5 mmol/L (ref 3.5–5.1)
Sodium: 148 mmol/L — ABNORMAL HIGH (ref 135–145)
Total Bilirubin: 0.5 mg/dL (ref 0.3–1.2)
Total Protein: 5.8 g/dL — ABNORMAL LOW (ref 6.5–8.1)

## 2022-06-04 LAB — TRIGLYCERIDES: Triglycerides: 131 mg/dL (ref <150)

## 2022-06-04 LAB — PHOSPHORUS: Phosphorus: 3.6 mg/dL (ref 2.5–4.6)

## 2022-06-04 LAB — MAGNESIUM: Magnesium: 2 mg/dL (ref 1.7–2.4)

## 2022-06-04 MED ORDER — FLUTICASONE PROPIONATE 50 MCG/ACT NA SUSP
1.0000 | Freq: Once | NASAL | Status: AC
  Administered 2022-06-04: 1 via NASAL
  Filled 2022-06-04 (×2): qty 16

## 2022-06-04 MED ORDER — POTASSIUM CHLORIDE 10 MEQ/100ML IV SOLN
10.0000 meq | INTRAVENOUS | Status: AC
  Administered 2022-06-04 (×4): 10 meq via INTRAVENOUS
  Filled 2022-06-04 (×4): qty 100

## 2022-06-04 MED ORDER — FLUTICASONE PROPIONATE 50 MCG/ACT NA SUSP
1.0000 | Freq: Every day | NASAL | Status: DC
  Administered 2022-06-04 – 2022-06-07 (×4): 1 via NASAL

## 2022-06-04 MED ORDER — INSULIN ASPART 100 UNIT/ML IJ SOLN
0.0000 [IU] | Freq: Four times a day (QID) | INTRAMUSCULAR | Status: DC
  Administered 2022-06-04 (×2): 7 [IU] via SUBCUTANEOUS
  Administered 2022-06-04: 5 [IU] via SUBCUTANEOUS
  Administered 2022-06-05: 7 [IU] via SUBCUTANEOUS

## 2022-06-04 MED ORDER — TRAVASOL 10 % IV SOLN
INTRAVENOUS | Status: AC
  Filled 2022-06-04: qty 1008

## 2022-06-04 NOTE — Progress Notes (Signed)
Physical Therapy Treatment Patient Details Name: Edward Newton MRN: [id number] DOB: 1940/07/02 Today's Date: 06/04/2022   History of Present Illness 81 y.o. male admitted 12/13 with abdominal pain and nausea found to have SBO. s/p small bowel resection 12/15. PMHx: HTN,  BPH,  Right knee OA , HLD,  T2DM,  Primary OA left hip.    PT Comments    Pt amenable to therapy session and initiated with LE ROM/strength as warm-up to reduce c/o soreness/stiffness and able to perform largely with supervision and occasional tactile assist for sequence.  Continued with mobility training requiring min A for supine to sit due to abdominal pain/weakness from recent surgical procedure and cues in splinting surgical site with pillow to good effect. Continued with transfer training and demo set-up to supervision level using RW for sit to stand and EOB to [facility] mobility.  Proceeded with ambulation training to increase independence and safety with walking in home/community and demonstrates slow, but steady pattern with heavy UE reliance on RW due to abdominal discomfort but overall did quite well with ambulation and maintaining requisite balance/stability on level surfaces with periodic standing rest breaks.  Returned to room without incident and no adverse effects and pt able to return to supine in bed with set-up assist and good practice of splinting for sit to supine.     Recommendations for follow up therapy are one component of a multi-disciplinary discharge planning process, led by the attending physician.  Recommendations may be updated based on patient status, additional functional criteria and insurance authorization.  Follow Up Recommendations  Home health PT     Assistance Recommended at Discharge Intermittent Supervision/Assistance  Patient can return home with the following A little help with bathing/dressing/bathroom;Assistance with cooking/housework;Assist for transportation;Help with stairs or ramp for  entrance;A little help with walking and/or transfers   Equipment Recommendations  Rolling walker (2 wheels)    Recommendations for Other Services       Precautions / Restrictions Restrictions Other Position/Activity Restrictions: NG to suction     Mobility  Bed Mobility   Bed Mobility: Supine to Sit, Sit to Supine     Supine to sit: Min assist, HOB elevated Sit to supine: Supervision   General bed mobility comments: good use of abdominal splinting with pillow    Transfers Overall transfer level: Needs assistance Equipment used: Rolling walker (2 wheels) Transfers: Sit to/from Stand, Bed to chair/wheelchair/BSC Sit to Stand: Supervision Stand pivot transfers: Supervision         General transfer comment: cues for line mgmt    Ambulation/Gait Ambulation/Gait assistance: Supervision Gait Distance (Feet): 300 Feet Assistive device: Rolling walker (2 wheels) Gait Pattern/deviations: WFL(Within Functional Limits)       General Gait Details: heavy reliance on UE support from RW due to abdominal discomfort   Stairs             Wheelchair Mobility    Modified Rankin (Stroke Patients Only)       Balance                                            Cognition Arousal/Alertness: Awake/alert Behavior During Therapy: WFL for tasks assessed/performed Overall Cognitive Status: Within Functional Limits for tasks assessed  General Comments: pleasant and cooperative        Exercises General Exercises - Lower Extremity Ankle Circles/Pumps: Strengthening, Both, 20 reps Quad Sets: Strengthening, Both, 20 reps Heel Slides: AROM, Both, 20 reps    General Comments        Pertinent Vitals/Pain Pain Assessment Pain Assessment: 0-10 Pain Score: 3  Pain Location: upper abdomen Pain Descriptors / Indicators: Discomfort, Sore Pain Intervention(s): Limited activity within patient's tolerance     Home Living                          Prior Function            PT Goals (current goals can now be found in the care plan section) Acute Rehab PT Goals Patient Stated Goal: go home PT Goal Formulation: With patient Time For Goal Achievement: 06/13/22 Potential to Achieve Goals: Good    Frequency    Min 3X/week      PT Plan Current plan remains appropriate    Co-evaluation              AM-PAC PT "6 Clicks" Mobility   Outcome Measure  Help needed turning from your back to your side while in a flat bed without using bedrails?: A Little Help needed moving from lying on your back to sitting on the side of a flat bed without using bedrails?: A Little Help needed moving to and from a bed to a chair (including a wheelchair)?: A Little Help needed standing up from a chair using your arms (e.g., wheelchair or bedside chair)?: A Little Help needed to walk in hospital room?: A Little Help needed climbing 3-5 steps with a railing? : A Little 6 Click Score: 18    End of Session Equipment Utilized During Treatment: Gait belt Activity Tolerance: Patient tolerated treatment well Patient left: in bed;with call bell/phone within reach Nurse Communication: Mobility status PT Visit Diagnosis: Muscle weakness (generalized) (M62.81);Pain Pain - part of body:  (abdomen)     Time: 0920-0950 PT Time Calculation (min) (ACUTE ONLY): 30 min  Charges:  $Gait Training: 8-22 mins $Therapeutic Activity: 8-22 mins                     10:32 AM, 06/04/22 M. Sherlyn Lees, PT, DPT Physical Therapist- [place] Office Number: [phone]

## 2022-06-04 NOTE — Progress Notes (Signed)
[facility] Progress Note  6 Days Post-Op  Subjective: CC-  States that he is feeling a little better. Abdominal pain is less. Passing flatus and has had 2 Bms today. NG still with high bilious output (2250cc, 1/2 during the day yesterday and 1/2 over night).  Having more drainage from midline wound. WBC down 11.6, TMAX 99.8  Objective: Vital signs in last 24 hours: Temp:  [98.6 F (37 C)-99.4 F (37.4 C)] 98.8 F (37.1 C) (12/21 0824) Pulse Rate:  [96-99] 99 (12/21 0824) Resp:  [16-19] 16 (12/21 0824) BP: (154-167)/(74-95) 155/95 (12/21 0824) SpO2:  [94 %-100 %] 95 % (12/21 0824) Last BM Date : 05/24/22 (pta)  Intake/Output from previous day: 12/20 0701 - 12/21 0700 In: 40 [I.V.:40] Out: 3775 [Urine:1525; Emesis/NG output:2250] Intake/Output this shift: No intake/output data recorded.  PE: Gen:  Alert, NAD, pleasant Pulm:  rate and effort normal Abd: Soft, mild distension, appropriately tender over incision, midline incision with staples present and purulent drainage from inferior portion  Lab Results:  Recent Labs    06/03/22 0247 06/04/22 0449  WBC 12.2* 11.6*  HGB 11.5* 11.5*  HCT 36.2* 35.9*  PLT PLATELET CLUMPS NOTED ON SMEAR 156   BMET Recent Labs    06/03/22 0247 06/03/22 1637 06/04/22 0449  NA 148* 150* 148*  K 3.8  --  3.5  CL 111  --  109  CO2 32  --  31  GLUCOSE 188*  --  249*  BUN 16  --  15  CREATININE 1.08  --  1.03  CALCIUM 8.3*  --  8.8*   PT/INR No results for input(s): "LABPROT", "INR" in the last 72 hours. CMP     Component Value Date/Time   NA 148 (H) 06/04/2022 0449   NA 140 06/25/2021 0945   K 3.5 06/04/2022 0449   CL 109 06/04/2022 0449   CO2 31 06/04/2022 0449   GLUCOSE 249 (H) 06/04/2022 0449   BUN 15 06/04/2022 0449   BUN 16 06/25/2021 0945   CREATININE 1.03 06/04/2022 0449   CREATININE 1.27 (H) 05/14/2016 1534   CALCIUM 8.8 (L) 06/04/2022 0449   PROT 5.8 (L) 06/04/2022 0449   PROT 6.8 02/02/2020 1017    ALBUMIN 2.2 (L) 06/04/2022 0449   ALBUMIN 4.1 02/02/2020 1017   AST 90 (H) 06/04/2022 0449   ALT 95 (H) 06/04/2022 0449   ALKPHOS 62 06/04/2022 0449   BILITOT 0.5 06/04/2022 0449   BILITOT 0.7 02/02/2020 1017   GFRNONAA >60 06/04/2022 0449   GFRNONAA 56 (L) 05/14/2016 1534   GFRAA 58 (L) 02/02/2020 1017   GFRAA 64 05/14/2016 1534   Lipase     Component Value Date/Time   LIPASE 23 05/27/2022 1009       Studies/Results: [place] EKG SITE RITE  Result Date: 06/02/2022 If Site Rite image not attached, placement could not be confirmed due to current cardiac rhythm.   Anti-infectives: Anti-infectives (From admission, onward)    Start     Dose/Rate Route Frequency Ordered Stop   05/29/22 0830  ceFAZolin (ANCEF) IVPB 2g/100 mL premix        2 g 200 mL/hr over 30 Minutes Intravenous  Once 05/29/22 0738 05/29/22 0945        Assessment/Plan SBO POD #6 s/p ex lap and SBR Dr. Grandville Silos 05/29/22 - CT 12/18 with no signs of anastomotic leak - WBC slightly down 11.6, TMAX 99.8 - 3 staples removed today 12/21 from midline incision for infection - pack BID  with 2x2 gauze - Having more bowel function but still with high bilious NG tube output. Will monitor volume of NG output this morning, if decreasing I think we could try clamping his NG tube - Mobilize, continue PT   FEN - NPO, NGT, IVF VTE - SCDs, lovenox ID - None   - Per TRH -  HTN HLD DM2 Remote hx parotid cancer (2004)  Incidential findings - Indeterminate right hepatic lobe lesion, right renal lesion and right adrenal lesion. Radiology recommending MRI w/ and w/o contrast to f/u this after sbo has resolved.    LOS: 7 days    [facility] 06/04/2022, 9:16 AM Please see Amion for pager number during day hours 7:00am-4:30pm

## 2022-06-04 NOTE — Progress Notes (Signed)
PHARMACY - TOTAL PARENTERAL NUTRITION CONSULT NOTE   Indication: Small bowel obstruction  Patient Measurements: Height: _0  (177.8 cm) Weight: 74.8 kg (165 lb) IBW/kg (Calculated) : 73 TPN AdjBW (KG): 74.8 Body mass index is 23.68 kg/m. Usual Weight: 75-77 kg  Assessment:  81 yo man with persistent SBO after exploratory laparotomy and small bowel resection on 12/15. Patient has had poor PO intake for > 9 days. Patient was receiving maintenance fluid with more than 100g dextrose per day prior to TPN initiation, which decreases risk for refeeding syndrome. Pharmacy consulted for TPN.   Glucose / Insulin: BG 190-287, A1C 7.4, used 21 units SSI last 24 hrs Electrolytes: Na 148, K 3.5, Mg 2 down, CoCa 10.2, others wnl Renal: Scr 1.08 (BL 1.1), BUN wnl  Hepatic: AST/ALT 90/95 stable x1, Alk phos/Tbili wnl, Albumin 2.2, TG 131 Intake / Output; MIVF:  UOP 0.8 ml/kg/hr, NGT 2250 ml, LBM 12/20, +flatus GI Imaging: 12/13 CT: high-grade small-bowel obstruction  12/14 KUB: Ongoing small bowel obstruction.  12/15 KUB: No improvement in small-bowel obstruction since 05/27/2022 12/17 KUB: Dilated loops of small bowel  12/18 CT: concerning for persistent SBO or ileus,  No specific findings to suggest anastomotic leak GI Surgeries / Procedures:  12/15 ex lap and SBR   Central access: PICC placed 12/19 TPN start date: 12/19   Nutritional Goals: Goal TPN rate is 100 mL/hr (provides 101 g of protein, 276 g dextrose and 1918 kcals per day)   RD Assessment: Estimated Needs Total Energy Estimated Needs: 1825-2190 kcals Total Protein Estimated Needs: 90-110 gm Total Fluid Estimated Needs: >/= 1.8 L  Current Nutrition:  NPO and TPN    Plan:  Continue TPN at goal rate of 100 mL/hr at 1800, provides 101g protein and 1918 kcal, meeting ~100% of estimated needs Electrolytes in TPN: Decrease Na 25 mEq/L and Phos 6 mmol/L; Increase K 45 mEq/L, Mg 5 mEq/L; Continue Ca 2 mEq/L and Cl:Ac Max Cl Add  standard MVI and trace elements to TPN Add 20 units regular insulin in TPN Change to resistant q6h SSI and adjust as needed   Additional fluid per team  Monitor TPN labs daily until stable at goal then on Mon/Thurs  Benetta Spar, PharmD, BCPS, [place] Pharmacist  Please check AMION for all [place] phone numbers After 10:00 PM, call [place]

## 2022-06-04 NOTE — Inpatient Diabetes Management (Signed)
Inpatient Diabetes Program Recommendations  AACE/ADA: New Consensus Statement on Inpatient Glycemic Control (2015)  Target Ranges:  Prepandial:   less than 140 mg/dL      Peak postprandial:   less than 180 mg/dL (1-2 hours)      Critically ill patients:  140 - 180 mg/dL   Lab Results  Component Value Date   GLUCAP 232 (H) 06/04/2022   HGBA1C 7.4 (A) 06/25/2021    Review of Glycemic Control  Latest Reference Range & Units 06/04/22 01:10 06/04/22 06:39 06/04/22 07:52  Glucose-Capillary 70 - 99 mg/dL 219 (H) 287 (H) 232 (H)  (H): Data is abnormally high  Diabetes history: DM Outpatient Diabetes medications: none Current orders for Inpatient glycemic control:  Novolog 0-20 units Q6H TPD with 20 units of regular insulin @ 100 ml/hr  Inpatient Diabetes Program Recommendations:    Might consider:  Levemir 8 units QD (0.1 units x 74.8 kg)  Will continue to follow while inpatient.  Thank you, Reche Dixon, MSN, [place] Diabetes Coordinator Inpatient Diabetes Program [phone] (team pager from 8a-5p)

## 2022-06-04 NOTE — Progress Notes (Signed)
     Daily Progress Note Intern Pager: [phone]  Patient name: Edward Newton Medical record number: [id number] Date of birth: 03-27-1941 Age: 81 y.o. Gender: male  Primary Care Provider: Rise Patience, DO Consultants: General surgery  Code Status: Full   Pt Overview and Major Events to Date:  12/13: Admitted to FMTS service; general surgery consulted 12/15: Ex-lap resulting in small bowel resection   Assessment and Plan: Edward Newton is a 81 y.o. male presenting with abdominal pain and nausea x 2 days and found to have SBO, now s/p ex lap. PMHx includes T2DM, HTN, and dyslipidemia.   * S/P small bowel resection Patient with multiple BMs, abdomen is only mildly distended. Continues to have copious NG output, will trial clamping NG per surgery. Midline incision with some dehiscence and drainage, addressed at bedside by surgery this AM. Some persistent hypernatremia to 148, likely due to intravascular depletion and losses from copious NG output. LFTs stable, will trend. Patient ambulating 1-2 times per day, encouraged continued activity. -General surgery following, appreciate recs -NPO with NG tube to intermittent suction, will trial clamping today -TPN per pharmacy -Pain management: Sch Tylenol IV, Methocarbamol prn, Dilaudid 0.5-'1mg'$  prn breakthrough pain -Transition to D5 1/4NS -Trend CMP -PT following  Cough Complained of cough overnight, likely secondary to post nasal drip. Patient improved this AM without intervention. -Trial Flonase if persistent cough  Type 2 diabetes mellitus (HCC) Glucose increased to 280s. Per pharmacy, will add 20U insulin to TPN for better sugar control -CBG +  resistant SSI  Hypertension Mildly hypertensive to 150-160s. -Consider starting BP agent with improving clinical picture   Chronic conditions: BPH: hold home Flomax due to NPO   FEN/GI: NPO with NG PPx: Lovenox Dispo: Home with [place] pending post-op recovery   Subjective:  Patient  assessed at bedside, states he has had multiple bowel movement yesterday evening and today. States he is ready to eat and feels some reflux like pain in the back of his throat. He continues to have copious NG output. States he is still urinating well without issue.  Objective: Temp:  [98.6 F (37 C)-99.4 F (37.4 C)] 98.8 F (37.1 C) (12/21 0824) Pulse Rate:  [96-99] 99 (12/21 0824) Resp:  [16-19] 16 (12/21 0824) BP: (154-167)/(74-95) 155/95 (12/21 0824) SpO2:  [94 %-100 %] 95 % (12/21 0824) Physical Exam: General: Alert, NAD Cardiovascular: RRR. No murmurs Respiratory: CTAB normal WOB Abdomen: Soft. Improving and minimal distension. Non-tender to palpation. Midline dressing with drainage on lower half. Extremities: Well perfused. No peripheral edema  Laboratory: Most recent CBC Lab Results  Component Value Date   WBC 11.6 (H) 06/04/2022   HGB 11.5 (L) 06/04/2022   HCT 35.9 (L) 06/04/2022   MCV 89.3 06/04/2022   PLT 156 06/04/2022   Most recent BMP    Latest Ref Rng & Units 06/04/2022    4:49 AM  BMP  Glucose 70 - 99 mg/dL 249   BUN 8 - 23 mg/dL 15   Creatinine 0.61 - 1.24 mg/dL 1.03   Sodium 135 - 145 mmol/L 148   Potassium 3.5 - 5.1 mmol/L 3.5   Chloride 98 - 111 mmol/L 109   CO2 22 - 32 mmol/L 31   Calcium 8.9 - 10.3 mg/dL 8.8     Other pertinent labs: Glu 220-280s  [name], MD 06/04/2022, 1:32 PM  PGY-1, [place] Intern pager: [phone], text pages welcome Secure chat group [email or web address]

## 2022-06-04 NOTE — Assessment & Plan Note (Deleted)
Stable, continue to monitor.

## 2022-06-05 ENCOUNTER — Inpatient Hospital Stay (HOSPITAL_COMMUNITY): Payer: Medicare Other

## 2022-06-05 DIAGNOSIS — Z9049 Acquired absence of other specified parts of digestive tract: Secondary | ICD-10-CM | POA: Diagnosis not present

## 2022-06-05 LAB — COMPREHENSIVE METABOLIC PANEL
ALT: 100 U/L — ABNORMAL HIGH (ref 0–44)
AST: 72 U/L — ABNORMAL HIGH (ref 15–41)
Albumin: 2.4 g/dL — ABNORMAL LOW (ref 3.5–5.0)
Alkaline Phosphatase: 64 U/L (ref 38–126)
Anion gap: 9 (ref 5–15)
BUN: 18 mg/dL (ref 8–23)
CO2: 30 mmol/L (ref 22–32)
Calcium: 9.2 mg/dL (ref 8.9–10.3)
Chloride: 109 mmol/L (ref 98–111)
Creatinine, Ser: 1.08 mg/dL (ref 0.61–1.24)
GFR, Estimated: >60 mL/min (ref >60)
Glucose, Bld: 309 mg/dL — ABNORMAL HIGH (ref 70–99)
Potassium: 4 mmol/L (ref 3.5–5.1)
Sodium: 148 mmol/L — ABNORMAL HIGH (ref 135–145)
Total Bilirubin: 0.5 mg/dL (ref 0.3–1.2)
Total Protein: 6.1 g/dL — ABNORMAL LOW (ref 6.5–8.1)

## 2022-06-05 LAB — CBC
HCT: 36.9 % — ABNORMAL LOW (ref 39.0–52.0)
Hemoglobin: 12.5 g/dL — ABNORMAL LOW (ref 13.0–17.0)
MCH: 29.7 pg (ref 26.0–34.0)
MCHC: 33.9 g/dL (ref 30.0–36.0)
MCV: 87.6 fL (ref 80.0–100.0)
Platelets: 237 10*3/uL (ref 150–400)
RBC: 4.21 MIL/uL — ABNORMAL LOW (ref 4.22–5.81)
RDW: 15.9 % — ABNORMAL HIGH (ref 11.5–15.5)
WBC: 12.4 10*3/uL — ABNORMAL HIGH (ref 4.0–10.5)
nRBC: 0 % (ref 0.0–0.2)

## 2022-06-05 LAB — GLUCOSE, CAPILLARY
Glucose-Capillary: 204 mg/dL — ABNORMAL HIGH (ref 70–99)
Glucose-Capillary: 209 mg/dL — ABNORMAL HIGH (ref 70–99)
Glucose-Capillary: 223 mg/dL — ABNORMAL HIGH (ref 70–99)
Glucose-Capillary: 230 mg/dL — ABNORMAL HIGH (ref 70–99)
Glucose-Capillary: 239 mg/dL — ABNORMAL HIGH (ref 70–99)
Glucose-Capillary: 276 mg/dL — ABNORMAL HIGH (ref 70–99)

## 2022-06-05 LAB — PHOSPHORUS: Phosphorus: 3.8 mg/dL (ref 2.5–4.6)

## 2022-06-05 LAB — MAGNESIUM: Magnesium: 2.2 mg/dL (ref 1.7–2.4)

## 2022-06-05 MED ORDER — TRAVASOL 10 % IV SOLN
INTRAVENOUS | Status: AC
  Filled 2022-06-05: qty 1008

## 2022-06-05 MED ORDER — INSULIN ASPART 100 UNIT/ML IJ SOLN: 5.0000 [IU] | INTRAMUSCULAR | Status: AC

## 2022-06-05 MED ORDER — INSULIN ASPART 100 UNIT/ML IJ SOLN: 10.0000 [IU] | INTRAMUSCULAR | Status: DC

## 2022-06-05 MED ORDER — INSULIN ASPART 100 UNIT/ML IJ SOLN
0.0000 [IU] | INTRAMUSCULAR | Status: DC
  Administered 2022-06-05 – 2022-06-06 (×2): 7 [IU] via SUBCUTANEOUS
  Administered 2022-06-06: 3 [IU] via SUBCUTANEOUS

## 2022-06-05 MED ORDER — IOHEXOL 350 MG/ML SOLN
75.0000 mL | Freq: Once | INTRAVENOUS | Status: AC | PRN
  Administered 2022-06-05: 75 mL via INTRAVENOUS

## 2022-06-05 MED ORDER — IOHEXOL 9 MG/ML PO SOLN
500.0000 mL | ORAL | Status: AC
  Administered 2022-06-05 (×2): 500 mL via ORAL

## 2022-06-05 NOTE — Inpatient Diabetes Management (Signed)
Inpatient Diabetes Program Recommendations  AACE/ADA: New Consensus Statement on Inpatient Glycemic Control (2015)  Target Ranges:  Prepandial:   less than 140 mg/dL      Peak postprandial:   less than 180 mg/dL (1-2 hours)      Critically ill patients:  140 - 180 mg/dL   Lab Results  Component Value Date   GLUCAP 276 (H) 06/05/2022   HGBA1C 7.4 (A) 06/25/2021    Review of Glycemic Control  Latest Reference Range & Units 06/04/22 07:52 06/04/22 11:50 06/05/22 00:29 06/05/22 05:38  Glucose-Capillary 70 - 99 mg/dL 232 (H) 204 (H) 204 (H) 276 (H)  (H): Data is abnormally high   Current orders for Inpatient glycemic control:  TPN with 20 units regular insulin @ 100/hr (increasing to 55 units tonight) Novolog 0-20 units Q4H  Inpatient Diabetes Program Recommendations:    Agree with increasing insulin in TPN this evening.  Might consider:  Novolog 3 units Q4H today at 12:00 & 16:00 today until new TPN bag is hung at 18:00.  Will continue to follow while inpatient.  Thank you, Reche Dixon, MSN, [place] Diabetes Coordinator Inpatient Diabetes Program [phone] (team pager from 8a-5p)

## 2022-06-05 NOTE — Progress Notes (Signed)
PHARMACY - TOTAL PARENTERAL NUTRITION CONSULT NOTE   Indication: Small bowel obstruction  Patient Measurements: Height: _0  (177.8 cm) Weight: 74.8 kg (165 lb) IBW/kg (Calculated) : 73 TPN AdjBW (KG): 74.8 Body mass index is 23.68 kg/m. Usual Weight: 75-77 kg  Assessment:  81 yo man with persistent SBO after exploratory laparotomy and small bowel resection on 12/15. Patient has had poor PO intake for > 9 days. Patient was receiving maintenance fluid with more than 100g dextrose per day prior to TPN initiation, which decreases risk for refeeding syndrome. Pharmacy consulted for TPN.   Glucose / Insulin: BG 204-309, A1C 7.4, used 26 units SSI last 24 hrs Electrolytes: Na 148, K 4 (received 40 IV mEq), Mg 2 down, CoCa 10.48, others wnl Renal: Scr 1.08 (BL 1.1), BUN wnl  Hepatic: AST/ALT 72/100 stable, Alk phos/Tbili wnl, Albumin 2.2, TG 131 Intake / Output; MIVF:  UOP 0.4 ml/kg/hr, NGT 2000 ml, LBM 12/21 x3, +flatus GI Imaging: 12/13 CT: high-grade small-bowel obstruction  12/14 KUB: Ongoing small bowel obstruction.  12/15 KUB: No improvement in small-bowel obstruction since 05/27/2022 12/17 KUB: Dilated loops of small bowel  12/18 CT: concerning for persistent SBO or ileus,  No specific findings to suggest anastomotic leak GI Surgeries / Procedures:  12/15 ex lap and SBR   Central access: PICC placed 12/19 TPN start date: 12/19   Nutritional Goals: Goal TPN rate is 100 mL/hr (provides 101 g of protein, 276 g dextrose and 1918 kcals per day)   RD Assessment: Estimated Needs Total Energy Estimated Needs: 1825-2190 kcals Total Protein Estimated Needs: 90-110 gm Total Fluid Estimated Needs: >/= 1.8 L  Current Nutrition:  NPO and TPN    Plan:  Continue TPN at goal rate of 100 mL/hr at 1800, provides 101g protein and 1918 kcal, meeting 100% of estimated needs Electrolytes in TPN: Decrease Na 0 mEq/L, Ca 0 mEq/L, Mg 2 mEq/L; Continue K 45 mEq/L, Phos 6 mmol/L, and Max Cl Add  standard MVI and trace elements to TPN Increase to 55 units regular insulin in TPN Increase to resistant q4h SSI, add 5 units q4hr for 3 doses (until next bag is hung) then adjust as needed   Additional fluid per team  Monitor TPN labs daily until stable at goal then on Mon/Thurs  Benetta Spar, PharmD, BCPS, [place] Pharmacist  Please check AMION for all [place] phone numbers After 10:00 PM, call [place]

## 2022-06-05 NOTE — Progress Notes (Addendum)
     Daily Progress Note Intern Pager: [phone]  Patient name: Edward Newton Medical record number: [id number] Date of birth: 12/27/1940 Age: 81 y.o. Gender: male  Primary Care Provider: Rise Patience, DO Consultants: General surgery  Code Status: Full   Pt Overview and Major Events to Date:  12/13: Admitted to FMTS service; general surgery consulted 12/15: Ex-lap resulting in small bowel resection   Assessment and Plan: Edward Newton is a 81 y.o. male presenting with abdominal pain and nausea x 2 days and found to have SBO, now s/p ex lap. PMHx includes T2DM, HTN, and dyslipidemia.   * S/P small bowel resection Multiple reported BMs and minimal abdominal distension. Continues to have copious NG output although improving (1L less than prior day). Some nausea this AM, Zofran dose given. Per surgery, some concern about persistent midline incision drainage, plan for CT scan today. Hypernatremia stable at 148. LFTs continue to be mildly elevated. Encouraged continued ambulation as tolerated. -General surgery following, appreciate recs -NPO with NG tube to intermittent suction -Plan to trial NG clamping per surgery -CT abdomen pending -TPN per pharmacy -Pain management: Sch Tylenol IV, Methocarbamol prn, Dilaudid 0.5-'1mg'$  prn breakthrough pain -D5 1/4NS -Trend CMP -PT following, HHPT at this time  Cough Intermittent cough, likely secondary to NG irritation per patient. -Continue Flonase for nasal irritation  Type 2 diabetes mellitus (HCC) BGL in 200-300s. -Plan to increase to 55U insulin in TPN per pharmacy -CBG +  resistant SSI  Hypertension Mildly hypertensive to 150-160s. -Will defer therapy until improving clinically    Chronic conditions: BPH: hold home Flomax due to NPO   FEN/GI: NPO with NG PPx: Lovenox Dispo: Home with [place] pending post-op recovery   Subjective:  Patient assessed at bedside, spoke with surgery PA in the hallway. Patient states he is still  having BM and denies pain in his abdomen. States he has to spit a lot but doesn't really feel nauseous. Would like to walk around today. He continues to request to eat stating "even a bite of applesauce".   Objective: Temp:  [98.1 F (36.7 C)-99.2 F (37.3 C)] 99.2 F (37.3 C) (12/22 0802) Pulse Rate:  [94-102] 94 (12/22 0802) Resp:  [16-17] 16 (12/22 0802) BP: (136-160)/(75-91) 136/75 (12/22 0802) SpO2:  [95 %-100 %] 95 % (12/22 0802) Physical Exam: General: Alert, NAD Cardiovascular: RRR. No murmurs Respiratory: CTAB normal WOB Abdomen: Soft. Minimal distension. Non-tender to palpation. Midline dressing with two spots of red drainage. Extremities: Well perfused. No peripheral edema  Laboratory: Most recent CBC Lab Results  Component Value Date   WBC 12.4 (H) 06/05/2022   HGB 12.5 (L) 06/05/2022   HCT 36.9 (L) 06/05/2022   MCV 87.6 06/05/2022   PLT 237 06/05/2022   Most recent BMP    Latest Ref Rng & Units 06/05/2022    4:05 AM  BMP  Glucose 70 - 99 mg/dL 309   BUN 8 - 23 mg/dL 18   Creatinine 0.61 - 1.24 mg/dL 1.08   Sodium 135 - 145 mmol/L 148   Potassium 3.5 - 5.1 mmol/L 4.0   Chloride 98 - 111 mmol/L 109   CO2 22 - 32 mmol/L 30   Calcium 8.9 - 10.3 mg/dL 9.2     Other pertinent labs: Mag: 2.2 Phos: 3.8   [name], MD 06/05/2022, 10:54 AM  PGY-1, [place] Intern pager: [phone], text pages welcome Secure chat group [place]

## 2022-06-05 NOTE — Progress Notes (Signed)
[facility] Progress Note  7 Days Post-Op  Subjective: CC-  States that it's too early to know how he's doing. Denies worsening abdominal pain. Passing flatus and had 3 Bms yesterday. He also had 2L bilious effluent from NG tube. WBC slightly up 12.4, TMAX 99.2  Objective: Vital signs in last 24 hours: Temp:  [98.1 F (36.7 C)-99.2 F (37.3 C)] 99.2 F (37.3 C) (12/22 0802) Pulse Rate:  [94-102] 94 (12/22 0802) Resp:  [16-17] 16 (12/22 0802) BP: (136-160)/(75-91) 136/75 (12/22 0802) SpO2:  [95 %-100 %] 95 % (12/22 0802) Last BM Date : 06/04/22 (Stated per pt.)  Intake/Output from previous day: 12/21 0701 - 12/22 0700 In: 130 [I.V.:10; NG/GT:120] Out: 2725 [Urine:725; Emesis/NG output:2000] Intake/Output this shift: No intake/output data recorded.  PE: Gen:  Alert, NAD, pleasant Pulm:  rate and effort normal Abd: Soft, mild distension, appropriately tender over incision, midline incision with staples present, inferior portion of wound opened with some persistent purulent drainage   Lab Results:  Recent Labs    06/04/22 0449 06/05/22 0405  WBC 11.6* 12.4*  HGB 11.5* 12.5*  HCT 35.9* 36.9*  PLT 156 237   BMET Recent Labs    06/04/22 0449 06/05/22 0405  NA 148* 148*  K 3.5 4.0  CL 109 109  CO2 31 30  GLUCOSE 249* 309*  BUN 15 18  CREATININE 1.03 1.08  CALCIUM 8.8* 9.2   PT/INR No results for input(s): "LABPROT", "INR" in the last 72 hours. CMP     Component Value Date/Time   NA 148 (H) 06/05/2022 0405   NA 140 06/25/2021 0945   K 4.0 06/05/2022 0405   CL 109 06/05/2022 0405   CO2 30 06/05/2022 0405   GLUCOSE 309 (H) 06/05/2022 0405   BUN 18 06/05/2022 0405   BUN 16 06/25/2021 0945   CREATININE 1.08 06/05/2022 0405   CREATININE 1.27 (H) 05/14/2016 1534   CALCIUM 9.2 06/05/2022 0405   PROT 6.1 (L) 06/05/2022 0405   PROT 6.8 02/02/2020 1017   ALBUMIN 2.4 (L) 06/05/2022 0405   ALBUMIN 4.1 02/02/2020 1017   AST 72 (H) 06/05/2022 0405    ALT 100 (H) 06/05/2022 0405   ALKPHOS 64 06/05/2022 0405   BILITOT 0.5 06/05/2022 0405   BILITOT 0.7 02/02/2020 1017   GFRNONAA >60 06/05/2022 0405   GFRNONAA 56 (L) 05/14/2016 1534   GFRAA 58 (L) 02/02/2020 1017   GFRAA 64 05/14/2016 1534   Lipase     Component Value Date/Time   LIPASE 23 05/27/2022 1009       Studies/Results: No results found.  Anti-infectives: Anti-infectives (From admission, onward)    Start     Dose/Rate Route Frequency Ordered Stop   05/29/22 0830  ceFAZolin (ANCEF) IVPB 2g/100 mL premix        2 g 200 mL/hr over 30 Minutes Intravenous  Once 05/29/22 0738 05/29/22 0945        Assessment/Plan SBO POD #7 s/p ex lap and SBR Dr. Grandville Silos 05/29/22 - 3 staples removed 12/21 from midline incision for infection - pack BID with 2x2 gauze - Mobilize, continue PT - WBC slightly up and patient with persistent high NG tube output. He also has some purulent drainage from his midline abdominal wound. Will repeat CT a/p today   FEN - NPO, NGT, IVF, TPN VTE - SCDs, lovenox ID - None   - Per TRH -  HTN HLD DM2 Remote hx parotid cancer (2004)  Incidential findings - Indeterminate right hepatic  lobe lesion, right renal lesion and right adrenal lesion. Radiology recommending MRI w/ and w/o contrast to f/u this after sbo has resolved.    LOS: 8 days    [facility] 06/05/2022, 8:55 AM Please see Amion for pager number during day hours 7:00am-4:30pm

## 2022-06-05 NOTE — Progress Notes (Signed)
Mobility Specialist Progress Note:   06/05/22 1001  Mobility  Activity Ambulated with assistance in room  Level of Assistance Standby assist, set-up cues, supervision of patient - no hands on  Assistive Device Front wheel walker  Distance Ambulated (ft) 200 ft  Activity Response Tolerated well  Mobility Referral Yes  $Mobility charge 1 Mobility   Pt received in bed and eager. No complaints. Pt left in chair with all needs met and call bell in reach.   Andrey Campanile Mobility Specialist Please contact via SecureChat or  Rehab office at [phone]

## 2022-06-06 DIAGNOSIS — Z9049 Acquired absence of other specified parts of digestive tract: Secondary | ICD-10-CM | POA: Diagnosis not present

## 2022-06-06 LAB — CBC
HCT: 36.1 % — ABNORMAL LOW (ref 39.0–52.0)
Hemoglobin: 11.5 g/dL — ABNORMAL LOW (ref 13.0–17.0)
MCH: 28.6 pg (ref 26.0–34.0)
MCHC: 31.9 g/dL (ref 30.0–36.0)
MCV: 89.8 fL (ref 80.0–100.0)
Platelets: 166 10*3/uL (ref 150–400)
RBC: 4.02 MIL/uL — ABNORMAL LOW (ref 4.22–5.81)
RDW: 15.9 % — ABNORMAL HIGH (ref 11.5–15.5)
WBC: 11.9 10*3/uL — ABNORMAL HIGH (ref 4.0–10.5)
nRBC: 0 % (ref 0.0–0.2)

## 2022-06-06 LAB — BASIC METABOLIC PANEL
Anion gap: 6 (ref 5–15)
BUN: 20 mg/dL (ref 8–23)
CO2: 29 mmol/L (ref 22–32)
Calcium: 8.5 mg/dL — ABNORMAL LOW (ref 8.9–10.3)
Chloride: 111 mmol/L (ref 98–111)
Creatinine, Ser: 1.02 mg/dL (ref 0.61–1.24)
GFR, Estimated: >60 mL/min (ref >60)
Glucose, Bld: 113 mg/dL — ABNORMAL HIGH (ref 70–99)
Potassium: 4.2 mmol/L (ref 3.5–5.1)
Sodium: 146 mmol/L — ABNORMAL HIGH (ref 135–145)

## 2022-06-06 LAB — GLUCOSE, CAPILLARY
Glucose-Capillary: 131 mg/dL — ABNORMAL HIGH (ref 70–99)
Glucose-Capillary: 101 mg/dL — ABNORMAL HIGH (ref 70–99)
Glucose-Capillary: 126 mg/dL — ABNORMAL HIGH (ref 70–99)
Glucose-Capillary: 171 mg/dL — ABNORMAL HIGH (ref 70–99)
Glucose-Capillary: 142 mg/dL — ABNORMAL HIGH (ref 70–99)
Glucose-Capillary: 158 mg/dL — ABNORMAL HIGH (ref 70–99)

## 2022-06-06 LAB — PHOSPHORUS: Phosphorus: 3.9 mg/dL (ref 2.5–4.6)

## 2022-06-06 LAB — MAGNESIUM: Magnesium: 2.2 mg/dL (ref 1.7–2.4)

## 2022-06-06 MED ORDER — SODIUM CHLORIDE 0.9 % IV SOLN
2.0000 g | INTRAVENOUS | Status: DC
  Administered 2022-06-06 – 2022-06-09 (×4): 2 g via INTRAVENOUS
  Filled 2022-06-06 (×4): qty 20

## 2022-06-06 MED ORDER — METRONIDAZOLE 500 MG/100ML IV SOLN
500.0000 mg | Freq: Two times a day (BID) | INTRAVENOUS | Status: DC
  Administered 2022-06-06 – 2022-06-09 (×7): 500 mg via INTRAVENOUS
  Filled 2022-06-06 (×7): qty 100

## 2022-06-06 MED ORDER — INSULIN ASPART 100 UNIT/ML IJ SOLN
0.0000 [IU] | Freq: Three times a day (TID) | INTRAMUSCULAR | Status: DC
  Administered 2022-06-06 – 2022-06-07 (×3): 2 [IU] via SUBCUTANEOUS
  Administered 2022-06-07: 1 [IU] via SUBCUTANEOUS
  Administered 2022-06-07: 2 [IU] via SUBCUTANEOUS
  Administered 2022-06-08: 3 [IU] via SUBCUTANEOUS

## 2022-06-06 MED ORDER — SODIUM CHLORIDE 0.9 % IV SOLN: 2.0000 g | INTRAVENOUS | Status: DC

## 2022-06-06 MED ORDER — TRAVASOL 10 % IV SOLN
INTRAVENOUS | Status: AC
  Filled 2022-06-06: qty 1008

## 2022-06-06 NOTE — Progress Notes (Addendum)
Daily Progress Note Intern Pager: [phone]  Patient name: Edward Newton Medical record number: [id number] Date of birth: [birth year] Age: 81 y.o. Gender: male  Primary Care Provider: Rise Patience, DO Consultants: General surgery  Code Status: Full   Pt Overview and Major Events to Date:  12/13: Admitted to FMTS service; general surgery consulted 12/15: Ex-lap resulting in small bowel resection   Assessment and Plan: Edward Newton is a 81 y.o. male presenting with abdominal pain and nausea x 2 days and found to have SBO, now s/p ex lap. PMHx includes T2DM, HTN, and dyslipidemia.   * S/P small bowel resection Continues to have BMs. CT abdomen showed some concern for midline incision infection and persistent ileus. Upon exam, foul smelling drainage for incision site. Started on CTX per surgery for 5 days. Hypernatremia improving to 146, likely secondary to titration of Na levels in TPN. Required 2 doses of prn Dilaudid overnight. NG output not charted, appears to be improving. Ambulate as tolerated by patient. -General surgery following, appreciate recs -NPO with NG tube to intermittent suction -Plan to trial NG clamping per surgery -Start CTX per surgery -Add Flagyl for additional anaerobic coverage -TPN per pharmacy -Pain management: Sch Tylenol IV, Dilaudid 0.5-'1mg'$  prn breakthrough pain -D5 1/4NS -PT consulted  Type 2 diabetes mellitus (HCC) BGL more controlled in 100s. -Continue 55U insulin in TPN per pharmacy -CBG +  resistant SSI  Hypertension Stable, continue to monitor.   Chronic conditions: BPH: hold home Flomax due to NPO   FEN/GI: NPO with NG PPx: Lovenox Dispo: Home with [facility] pending post-op recovery  Subjective:  Patient assessed at bedside, wife present. States the pain comes and goes but the medication helps. States he is still having BMs. Some nausea and spitting up that he feels is due to the NG tube irritation. Patient requested to walk. States he  is still having some drainage from the midline incision and "it stinks".  Objective: Temp:  [97.5 F (36.4 C)-98.5 F (36.9 C)] 98.4 F (36.9 C) (12/23 0808) Pulse Rate:  [86-105] 86 (12/23 0808) Resp:  [16-18] 18 (12/23 0808) BP: (120-151)/(66-87) 120/66 (12/23 0808) SpO2:  [94 %-97 %] 97 % (12/23 6967) Physical Exam: General: Alert, NAD Cardiovascular: RRR. No murmurs Respiratory: CTAB normal WOB Abdomen: Soft. Minimal distension. Non-tender to palpation. Midline dressing saturated with foul smelling brownish discharge. Extremities: Well perfused. No peripheral edema  Laboratory: Most recent CBC Lab Results  Component Value Date   WBC 11.9 (H) 06/06/2022   HGB 11.5 (L) 06/06/2022   HCT 36.1 (L) 06/06/2022   MCV 89.8 06/06/2022   PLT 166 06/06/2022   Most recent BMP    Latest Ref Rng & Units 06/06/2022    4:50 AM  BMP  Glucose 70 - 99 mg/dL 113   BUN 8 - 23 mg/dL 20   Creatinine 0.61 - 1.24 mg/dL 1.02   Sodium 135 - 145 mmol/L 146   Potassium 3.5 - 5.1 mmol/L 4.2   Chloride 98 - 111 mmol/L 111   CO2 22 - 32 mmol/L 29   Calcium 8.9 - 10.3 mg/dL 8.5     Other pertinent labs: Mag: 2.2 Phos: 3.9   Imaging/Diagnostic Tests: CT ABDOMEN PELVIS W CONTRAST Result Date: 06/05/2022 IMPRESSION: 1. Prior partial small-bowel resection. Multiple mildly dilated loops of small bowel again seen with gradual transition point just proximal to the anastomosis, findings are favored to be due to ileus, persistent partial small bowel obstruction is also a consideration. Degree  of small-bowel distention is similar to prior exam. 2. Midline abdominal incision with increased loculated air and trace fluid, concerning for infection. Recommend clinical correlation. 3. Trace abdominal ascites, decreased when compared with the prior exam. 4. Small bilateral pleural effusions and bibasilar atelectasis. 5. Unchanged indeterminate lesions of the liver, right kidney, and right adrenal gland. Recommend  contrast-enhanced abdominal MRI for further characterization of these lesions, this can be performed non emergently. 6. Aortic Atherosclerosis (ICD10-I70.0).  Edward Maryland, MD 06/06/2022, 10:55 AM  PGY-1, [facility] Intern pager: [phone], text pages welcome Secure chat group [place]

## 2022-06-06 NOTE — Progress Notes (Signed)
PHARMACY - TOTAL PARENTERAL NUTRITION CONSULT NOTE   Indication: Small bowel obstruction  Patient Measurements: Height: _0  (177.8 cm) Weight: 74.8 kg (165 lb) IBW/kg (Calculated) : 73 TPN AdjBW (KG): 74.8 Body mass index is 23.68 kg/m. Usual Weight: 75-77 kg  Assessment:  81 yo man with persistent SBO after exploratory laparotomy and small bowel resection on 12/15. Patient has had poor PO intake for > 9 days. Patient was receiving maintenance fluid with more than 100g dextrose per day prior to TPN initiation, which decreases risk for refeeding syndrome. Pharmacy consulted for TPN.   Glucose / Insulin: BG 101-239, A1C 7.4, used 17 units SSI last 24 hrs  Electrolytes: Na 146, CoCa 9.7, others wnl Renal: Scr 1.08 (BL 1.1), BUN wnl  Hepatic: AST/ALT 72/100 stable, Alk phos/Tbili wnl, Albumin 2.2, TG 131 Intake / Output; MIVF:  UOP 0.7 ml/kg/hr, NGT 0 ml charted but was at 2019m, LBM 12/21 x3, +flatus GI Imaging: 12/13 CT: high-grade small-bowel obstruction  12/14 KUB: Ongoing small bowel obstruction.  12/15 KUB: No improvement in small-bowel obstruction since 05/27/2022 12/17 KUB: Dilated loops of small bowel  12/18 CT: concerning for persistent SBO or ileus,  No specific findings to suggest anastomotic leak GI Surgeries / Procedures:  12/15 ex lap and SBR   Central access: PICC placed 12/19 TPN start date: 12/19   Nutritional Goals: Goal TPN rate is 100 mL/hr (provides 101 g of protein, 276 g dextrose and 1918 kcals per day)   RD Assessment: Estimated Needs Total Energy Estimated Needs: 1825-2190 kcals Total Protein Estimated Needs: 90-110 gm Total Fluid Estimated Needs: >/= 1.8 L  Current Nutrition:  NPO and TPN    Plan:  Continue TPN at goal rate of 100 mL/hr at 1800, provides 101g protein and 1918 kcal, meeting 100% of estimated needs Electrolytes in TPN: Decrease K 35 mEq/L; Continue  Na 0 mEq/L, Ca 0 mEq/L, Mg 2 mEq/L, Phos 6 mmol/L, and Max Cl Add standard MVI  and trace elements to TPN Decrease to 48 units regular insulin in TPN Decrease to sensitive q8h SSI, then adjust as needed   Additional fluid per team  Monitor TPN labs daily until stable at goal then on Mon/Thurs  LBenetta Spar PharmD, BCPS, BMaugansvillePharmacist  Please check AMION for all [place] numbers After 10:00 PM, call [place]

## 2022-06-06 NOTE — Progress Notes (Signed)
Patient ID: Edward Newton, male   DOB: [birth year], 81 y.o.   MRN: [id number] 8 Days Post-Op    Subjective: Passing some gas, BM yesterday ROS negative except as listed above. Objective: Vital signs in last 24 hours: Temp:  [97.5 F (36.4 C)-98.5 F (36.9 C)] 98.4 F (36.9 C) (12/23 0808) Pulse Rate:  [86-105] 86 (12/23 0808) Resp:  [16-18] 18 (12/23 0808) BP: (120-151)/(66-87) 120/66 (12/23 0808) SpO2:  [94 %-97 %] 97 % (12/23 0808) Last BM Date : 06/05/22  Intake/Output from previous day: 12/22 0701 - 12/23 0700 In: -  Out: 650 [Urine:650] Intake/Output this shift: No intake/output data recorded.  Wound opened at center with some purulent drainage, NGT bilious  Lab Results: CBC  Recent Labs    06/05/22 0405 06/06/22 0450  WBC 12.4* 11.9*  HGB 12.5* 11.5*  HCT 36.9* 36.1*  PLT 237 166   BMET Recent Labs    06/05/22 0405 06/06/22 0450  NA 148* 146*  K 4.0 4.2  CL 109 111  CO2 30 29  GLUCOSE 309* 113*  BUN 18 20  CREATININE 1.08 1.02  CALCIUM 9.2 8.5*   PT/INR No results for input(s): "LABPROT", "INR" in the last 72 hours. ABG No results for input(s): "PHART", "HCO3" in the last 72 hours.  Invalid input(s): "PCO2", "PO2"  Studies/Results: CT ABDOMEN PELVIS W CONTRAST  Result Date: 06/05/2022 CLINICAL DATA:  Postop abdominal pain EXAM: CT ABDOMEN AND PELVIS WITH CONTRAST TECHNIQUE: Multidetector CT imaging of the abdomen and pelvis was performed using the standard protocol following bolus administration of intravenous contrast. RADIATION DOSE REDUCTION: This exam was performed according to the departmental dose-optimization program which includes automated exposure control, adjustment of the mA and/or kV according to patient size and/or use of iterative reconstruction technique. CONTRAST:  54m OMNIPAQUE IOHEXOL 350 MG/ML SOLN COMPARISON:  CT abdomen and pelvis dated 18th 2023 FINDINGS: Lower chest: Small bilateral pleural effusions bibasilar atelectasis.  Hepatobiliary: Unchanged low-attenuation lesion of the posterior right lobe of the liver, measuring up to 1.9 cm on series 3, image 20 gallbladder is unremarkable. No biliary ductal dilation Pancreas: Unremarkable. No pancreatic ductal dilatation or surrounding inflammatory changes. Spleen: Normal in size without focal abnormality. Adrenals/Urinary Tract: Stable right adrenal gland nodule. Left adrenal gland is unremarkable. No hydronephrosis or nephrolithiasis. Bilateral low-attenuation renal lesions, including unchanged indeterminate attenuation lesion of the inferior pole the right kidney measuring 1.0 cm on series 3, image 41. Bladder is unremarkable. Stomach/Bowel: Prior partial small-bowel resection. Multiple mildly dilated loops of small bowel with gradual transition point just proximal to the anastomosis. Contrast material seen in the small and large bowel. Severe diverticulosis. Vascular/Lymphatic: Aortic atherosclerosis. No enlarged abdominal or pelvic lymph nodes. Reproductive: Unchanged severe prostatomegaly. Other: Trace abdominal ascites, decreased when compared with prior exam. Midline abdominal incision with increased loculated air and trace fluid. Largest organized collection measuring up to 1.6 x 1.4 cm on series 3, image 43. Interval resolution of trace pneumoperitoneum. Musculoskeletal: No acute or significant osseous findings. IMPRESSION: 1. Prior partial small-bowel resection. Multiple mildly dilated loops of small bowel again seen with gradual transition point just proximal to the anastomosis, findings are favored to be due to ileus, persistent partial small bowel obstruction is also a consideration. Degree of small-bowel distention is similar to prior exam. 2. Midline abdominal incision with increased loculated air and trace fluid, concerning for infection. Recommend clinical correlation. 3. Trace abdominal ascites, decreased when compared with the prior exam. 4. Small bilateral pleural  effusions and bibasilar  atelectasis. 5. Unchanged indeterminate lesions of the liver, right kidney, and right adrenal gland. Recommend contrast-enhanced abdominal MRI for further characterization of these lesions, this can be performed non emergently. 6. Aortic Atherosclerosis (ICD10-I70.0). Electronically Signed   By: Yetta Glassman M.D.   On: 06/05/2022 16:53    Anti-infectives: Anti-infectives (From admission, onward)    Start     Dose/Rate Route Frequency Ordered Stop   05/29/22 0830  ceFAZolin (ANCEF) IVPB 2g/100 mL premix        2 g 200 mL/hr over 30 Minutes Intravenous  Once 05/29/22 0738 05/29/22 0945       Assessment/Plan: SBO POD #8 s/p ex lap and SBR Dr. Grandville Silos 05/29/22 - 3 staples removed 12/21 from midline incision for infection - pack BID with 2x2 gauze, start ceftriaxone and plan 5d - Mobilize, continue PT - CT A/P 12/22 shows ileus and wound infection as above. Continue NGT today though is starting to have some bowel function   FEN - NPO, NGT, IVF, TPN VTE - SCDs, lovenox ID - None   - Per TRH -  HTN HLD DM2 Remote hx parotid cancer (2004)  Incidential findings - Indeterminate right hepatic lobe lesion, right renal lesion and right adrenal lesion. Radiology recommending MRI w/ and w/o contrast to f/u this after sbo has resolved.   LOS: 9 days    Georganna Skeans, MD, MPH, FACS Tra[facility] General Surgery Use [email or web address] to contact on call provider  06/06/2022

## 2022-06-06 NOTE — Progress Notes (Signed)
Mobility Specialist Progress Note:   06/06/22 0910  Mobility  Activity  (bed level ex. (limited))  Assistive Device None  Range of Motion/Exercises Active;All extremities  Activity Response Tolerated well  Mobility Referral Yes  $Mobility charge 1 Mobility   Pt not feeling well this am, c/o drainage from incision. Politely declining OOB mobility, agreeable to bed level exercises. Pt tolerated fair, with mild abdominal pain. Left with all needs met, will return tomorrow for hopeful ambulation.   Nelta Numbers Mobility Specialist Please contact via SecureChat or  Rehab office at [phone]

## 2022-06-07 DIAGNOSIS — Z9049 Acquired absence of other specified parts of digestive tract: Secondary | ICD-10-CM | POA: Diagnosis not present

## 2022-06-07 LAB — BASIC METABOLIC PANEL
Anion gap: 5 (ref 5–15)
BUN: 18 mg/dL (ref 8–23)
CO2: 27 mmol/L (ref 22–32)
Calcium: 8.2 mg/dL — ABNORMAL LOW (ref 8.9–10.3)
Chloride: 110 mmol/L (ref 98–111)
Creatinine, Ser: 1.03 mg/dL (ref 0.61–1.24)
GFR, Estimated: >60 mL/min (ref >60)
Glucose, Bld: 190 mg/dL — ABNORMAL HIGH (ref 70–99)
Potassium: 4.1 mmol/L (ref 3.5–5.1)
Sodium: 142 mmol/L (ref 135–145)

## 2022-06-07 LAB — GLUCOSE, CAPILLARY
Glucose-Capillary: 145 mg/dL — ABNORMAL HIGH (ref 70–99)
Glucose-Capillary: 169 mg/dL — ABNORMAL HIGH (ref 70–99)
Glucose-Capillary: 186 mg/dL — ABNORMAL HIGH (ref 70–99)
Glucose-Capillary: 230 mg/dL — ABNORMAL HIGH (ref 70–99)

## 2022-06-07 MED ORDER — ATORVASTATIN CALCIUM 40 MG PO TABS
40.0000 mg | ORAL_TABLET | Freq: Every day | ORAL | Status: DC
  Administered 2022-06-07 – 2022-06-09 (×3): 40 mg via ORAL
  Filled 2022-06-07 (×3): qty 1

## 2022-06-07 MED ORDER — TAMSULOSIN HCL 0.4 MG PO CAPS
0.4000 mg | ORAL_CAPSULE | Freq: Every evening | ORAL | Status: DC
  Administered 2022-06-07 – 2022-06-08 (×2): 0.4 mg via ORAL
  Filled 2022-06-07 (×2): qty 1

## 2022-06-07 MED ORDER — TRAVASOL 10 % IV SOLN
INTRAVENOUS | Status: AC
  Filled 2022-06-07: qty 1008

## 2022-06-07 NOTE — Progress Notes (Signed)
     Daily Progress Note Intern Pager: [phone]  Patient name: Edward Newton Medical record number: [id number] Date of birth: Nov 07, 1940 Age: 81 y.o. Gender: male  Primary Care Provider: Rise Patience, DO Consultants: General surgery  Code Status: Full   Pt Overview and Major Events to Date:  12/13: Admitted to FMTS service; general surgery consulted 12/15: Ex-lap resulting in small bowel resection   Assessment and Plan: Edward Newton is a 81 y.o. male presenting with abdominal pain and nausea x 2 days and found to have SBO, now s/p ex lap. PMHx includes T2DM, HTN, and dyslipidemia.   * S/P small bowel resection 2 BMs recorded. Per nursing, no NG output overnight. Patient NG came out this AM, will not replace at this time given surgery recommendations. Start clear diet. Denies N/V currently. Continues to have foul smelling drainage from midline incision, on abx therapy. BMP wnl. -General surgery following, appreciate recs -Start clear diet per surgery -Continue CTX and Flagyl for potential intra-abdominal infection -TPN per pharmacy -Pain management: Sch Tylenol IV, Dilaudid 0.5-'1mg'$  prn breakthrough pain -D5 1/4NS -PT consulted, ambulate as tolerated  Type 2 diabetes mellitus (HCC) BGL in 100-200s. -53U insulin in TPN per pharmacy -CBG +  resistant SSI   Chronic conditions: BPH: hold home Flomax due to NPO   FEN/GI: NPO with NG PPx: Lovenox Dispo: Home with [facility] pending post-op recovery  Subjective:  Patient assessed at bedside, states he is feeling stronger and improving. States he has some mild pain on the sides of his belly. Denies N/V. States he would like to walk today if possible. Continues to request applesauce when he can eat.  Objective: Temp:  [97.4 F (36.3 C)-98.6 F (37 C)] 97.4 F (36.3 C) (12/24 0748) Pulse Rate:  [80-93] 81 (12/24 0748) Resp:  [16-19] 19 (12/24 0748) BP: (124-138)/(73-76) 138/75 (12/24 0748) SpO2:  [94 %-96 %] 95 % (12/24  0748) Physical Exam: General: Alert, NAD Cardiovascular: RRR. No murmurs Respiratory: CTAB normal WOB Abdomen: Soft. Minimal distension. Non-tender to palpation. Midline dressing with some foul smelling brownish discharge. Extremities: Well perfused. No peripheral edema  Laboratory: Most recent CBC Lab Results  Component Value Date   WBC 11.9 (H) 06/06/2022   HGB 11.5 (L) 06/06/2022   HCT 36.1 (L) 06/06/2022   MCV 89.8 06/06/2022   PLT 166 06/06/2022   Most recent BMP    Latest Ref Rng & Units 06/07/2022    3:36 AM  BMP  Glucose 70 - 99 mg/dL 190   BUN 8 - 23 mg/dL 18   Creatinine 0.61 - 1.24 mg/dL 1.03   Sodium 135 - 145 mmol/L 142   Potassium 3.5 - 5.1 mmol/L 4.1   Chloride 98 - 111 mmol/L 110   CO2 22 - 32 mmol/L 27   Calcium 8.9 - 10.3 mg/dL 8.2     Other pertinent labs: Glu: 158>230   [name], MD 06/07/2022, 10:23 AM  PGY-1, [place] Intern pager: [phone], text pages welcome Secure chat group [place]

## 2022-06-07 NOTE — Progress Notes (Signed)
Patient ID: Edward Newton, male   DOB: 03-25-1941, 81 y.o.   MRN: [id number] 9 Days Post-Op    Subjective: Feels good today.  Had bowel movements  ROS negative except as listed above. Objective: Vital signs in last 24 hours: Temp:  [97.4 F (36.3 C)-98.6 F (37 C)] 97.4 F (36.3 C) (12/24 0748) Pulse Rate:  [80-93] 81 (12/24 0748) Resp:  [16-19] 19 (12/24 0748) BP: (124-138)/(73-76) 138/75 (12/24 0748) SpO2:  [94 %-96 %] 95 % (12/24 0748) Last BM Date : 06/05/22  Intake/Output from previous day: 12/23 0701 - 12/24 0700 In: -  Out: 700 [Urine:450; Emesis/NG output:250] Intake/Output this shift: No intake/output data recorded.  Wound opened at center previously, continued thin liquid derainage on bandage, NG removed overnight  Lab Results: CBC  Recent Labs    06/05/22 0405 06/06/22 0450  WBC 12.4* 11.9*  HGB 12.5* 11.5*  HCT 36.9* 36.1*  PLT 237 166    BMET Recent Labs    06/05/22 0405 06/06/22 0450  NA 148* 146*  K 4.0 4.2  CL 109 111  CO2 30 29  GLUCOSE 309* 113*  BUN 18 20  CREATININE 1.08 1.02  CALCIUM 9.2 8.5*    PT/INR No results for input(s): "LABPROT", "INR" in the last 72 hours. ABG No results for input(s): "PHART", "HCO3" in the last 72 hours.  Invalid input(s): "PCO2", "PO2"  Studies/Results: CT ABDOMEN PELVIS W CONTRAST  Result Date: 06/05/2022 CLINICAL DATA:  Postop abdominal pain EXAM: CT ABDOMEN AND PELVIS WITH CONTRAST TECHNIQUE: Multidetector CT imaging of the abdomen and pelvis was performed using the standard protocol following bolus administration of intravenous contrast. RADIATION DOSE REDUCTION: This exam was performed according to the departmental dose-optimization program which includes automated exposure control, adjustment of the mA and/or kV according to patient size and/or use of iterative reconstruction technique. CONTRAST:  39m OMNIPAQUE IOHEXOL 350 MG/ML SOLN COMPARISON:  CT abdomen and pelvis dated 18th 2023 FINDINGS:  Lower chest: Small bilateral pleural effusions bibasilar atelectasis. Hepatobiliary: Unchanged low-attenuation lesion of the posterior right lobe of the liver, measuring up to 1.9 cm on series 3, image 20 gallbladder is unremarkable. No biliary ductal dilation Pancreas: Unremarkable. No pancreatic ductal dilatation or surrounding inflammatory changes. Spleen: Normal in size without focal abnormality. Adrenals/Urinary Tract: Stable right adrenal gland nodule. Left adrenal gland is unremarkable. No hydronephrosis or nephrolithiasis. Bilateral low-attenuation renal lesions, including unchanged indeterminate attenuation lesion of the inferior pole the right kidney measuring 1.0 cm on series 3, image 41. Bladder is unremarkable. Stomach/Bowel: Prior partial small-bowel resection. Multiple mildly dilated loops of small bowel with gradual transition point just proximal to the anastomosis. Contrast material seen in the small and large bowel. Severe diverticulosis. Vascular/Lymphatic: Aortic atherosclerosis. No enlarged abdominal or pelvic lymph nodes. Reproductive: Unchanged severe prostatomegaly. Other: Trace abdominal ascites, decreased when compared with prior exam. Midline abdominal incision with increased loculated air and trace fluid. Largest organized collection measuring up to 1.6 x 1.4 cm on series 3, image 43. Interval resolution of trace pneumoperitoneum. Musculoskeletal: No acute or significant osseous findings. IMPRESSION: 1. Prior partial small-bowel resection. Multiple mildly dilated loops of small bowel again seen with gradual transition point just proximal to the anastomosis, findings are favored to be due to ileus, persistent partial small bowel obstruction is also a consideration. Degree of small-bowel distention is similar to prior exam. 2. Midline abdominal incision with increased loculated air and trace fluid, concerning for infection. Recommend clinical correlation. 3. Trace abdominal ascites,  decreased when compared  with the prior exam. 4. Small bilateral pleural effusions and bibasilar atelectasis. 5. Unchanged indeterminate lesions of the liver, right kidney, and right adrenal gland. Recommend contrast-enhanced abdominal MRI for further characterization of these lesions, this can be performed non emergently. 6. Aortic Atherosclerosis (ICD10-I70.0). Electronically Signed   By: Yetta Glassman M.D.   On: 06/05/2022 16:53    Anti-infectives: Anti-infectives (From admission, onward)    Start     Dose/Rate Route Frequency Ordered Stop   06/06/22 1115  metroNIDAZOLE (FLAGYL) IVPB 500 mg        500 mg 100 mL/hr over 60 Minutes Intravenous Every 12 hours 06/06/22 1029     06/06/22 1000  cefTRIAXone (ROCEPHIN) 2 g in sodium chloride 0.9 % 100 mL IVPB  Status:  Discontinued        2 g 200 mL/hr over 30 Minutes Intravenous Every 24 hours 06/06/22 0914 06/06/22 0914   06/06/22 1000  cefTRIAXone (ROCEPHIN) 2 g in sodium chloride 0.9 % 100 mL IVPB        2 g 200 mL/hr over 30 Minutes Intravenous Every 24 hours 06/06/22 0914 06/11/22 0959   05/29/22 0830  ceFAZolin (ANCEF) IVPB 2g/100 mL premix        2 g 200 mL/hr over 30 Minutes Intravenous  Once 05/29/22 0738 05/29/22 0945       Assessment/Plan: SBO POD #9 s/p ex lap and SBR Dr. Grandville Silos 05/29/22 - 3 staples removed 12/21 from midline incision for infection - pack BID with 2x2 gauze, start ceftriaxone and plan 5d - monitor wound - Mobilize, continue PT - CT A/P 12/22 shows ileus and wound infection as above.  - Bowel function returned.  Start clear liquid diet, advance as tolerated    FEN - NPO, NGT, IVF, TPN VTE - SCDs, lovenox ID - None   - Per TRH -  HTN HLD DM2 Remote hx parotid cancer (2004)  Incidential findings - Indeterminate right hepatic lobe lesion, right renal lesion and right adrenal lesion. Radiology recommending MRI w/ and w/o contrast to f/u this after sbo has resolved.   LOS: 10 days    Felicie Morn, MD   06/07/2022

## 2022-06-07 NOTE — Progress Notes (Signed)
PHARMACY - TOTAL PARENTERAL NUTRITION CONSULT NOTE   Indication: Small bowel obstruction  Patient Measurements: Height: 5' 10" (177.8 cm) Weight: 74.8 kg (165 lb) IBW/kg (Calculated) : 73 TPN AdjBW (KG): 74.8 Body mass index is 23.68 kg/m. Usual Weight: 75-77 kg  Assessment:  81 yo man with persistent SBO after exploratory laparotomy and small bowel resection on 12/15. Patient has had poor PO intake for > 9 days. Patient was receiving maintenance fluid with more than 100g dextrose per day prior to TPN initiation, which decreases risk for refeeding syndrome. Pharmacy consulted for TPN.   Glucose / Insulin: BG 126-230, A1C 7.4, used 4 units SSI last 24 hrs  Electrolytes: last labs 12/23- Na 146, CoCa 9.7, others wnl Renal: Scr 1.08 (BL 1.1), BUN wnl  Hepatic: AST/ALT 72/100 stable, Alk phos/Tbili wnl, Albumin 2.2, TG 131 Intake / Output; MIVF:  UOP 0.3 ml/kg/hr- likely not fully charted, NGT 228m, LBM 12/23 x2  GI Imaging: 12/13 CT: high-grade small-bowel obstruction  12/14 KUB: Ongoing small bowel obstruction.  12/15 KUB: No improvement in small-bowel obstruction since 05/27/2022 12/17 KUB: Dilated loops of small bowel  12/18 CT: concerning for persistent SBO or ileus,  No specific findings to suggest anastomotic leak 12/22 CT: partial SBO with ileus GI Surgeries / Procedures:  12/15 ex lap and SBR   Central access: PICC placed 12/19 TPN start date: 12/19   Nutritional Goals: Goal TPN rate is 100 mL/hr (provides 101 g of protein, 276 g dextrose and 1918 kcals per day)   RD Assessment: Estimated Needs Total Energy Estimated Needs: 1825-2190 kcals Total Protein Estimated Needs: 90-110 gm Total Fluid Estimated Needs: >/= 1.8 L  Current Nutrition:  NPO and TPN    Plan:  Continue TPN at goal rate of 100 mL/hr at 1800, provides 101g protein and 1918 kcal, meeting 100% of estimated needs Electrolytes in TPN:  Continue  Na 0 mEq/L, K 35 mEq/L Ca 0 mEq/L, Mg 2 mEq/L, Phos 6  mmol/L, and Max Cl Add standard MVI and trace elements to TPN Increase to 53 units regular insulin in TPN Continue to sensitive q8h SSI, then adjust as needed   Additional fluid per team  Monitor TPN labs daily until stable at goal then on Mon/Thurs  LBenetta Spar PharmD, BCPS, BStrawnPharmacist  Please check AMION for all [place]phone numbers After 10:00 PM, call MDelphos

## 2022-06-08 DIAGNOSIS — Z9049 Acquired absence of other specified parts of digestive tract: Secondary | ICD-10-CM

## 2022-06-08 LAB — COMPREHENSIVE METABOLIC PANEL
ALT: 49 U/L — ABNORMAL HIGH (ref 0–44)
AST: 24 U/L (ref 15–41)
Albumin: 2.2 g/dL — ABNORMAL LOW (ref 3.5–5.0)
Alkaline Phosphatase: 53 U/L (ref 38–126)
Anion gap: 7 (ref 5–15)
BUN: 18 mg/dL (ref 8–23)
CO2: 24 mmol/L (ref 22–32)
Calcium: 8.3 mg/dL — ABNORMAL LOW (ref 8.9–10.3)
Chloride: 108 mmol/L (ref 98–111)
Creatinine, Ser: 1.04 mg/dL (ref 0.61–1.24)
GFR, Estimated: >60 mL/min (ref >60)
Glucose, Bld: 175 mg/dL — ABNORMAL HIGH (ref 70–99)
Potassium: 4.1 mmol/L (ref 3.5–5.1)
Sodium: 139 mmol/L (ref 135–145)
Total Bilirubin: 0.5 mg/dL (ref 0.3–1.2)
Total Protein: 5.9 g/dL — ABNORMAL LOW (ref 6.5–8.1)

## 2022-06-08 LAB — CBC
HCT: 33.6 % — ABNORMAL LOW (ref 39.0–52.0)
Hemoglobin: 11.1 g/dL — ABNORMAL LOW (ref 13.0–17.0)
MCH: 29.3 pg (ref 26.0–34.0)
MCHC: 33 g/dL (ref 30.0–36.0)
MCV: 88.7 fL (ref 80.0–100.0)
Platelets: 270 10*3/uL (ref 150–400)
RBC: 3.79 MIL/uL — ABNORMAL LOW (ref 4.22–5.81)
RDW: 15.5 % (ref 11.5–15.5)
WBC: 8.7 10*3/uL (ref 4.0–10.5)
nRBC: 0 % (ref 0.0–0.2)

## 2022-06-08 LAB — PHOSPHORUS: Phosphorus: 3.4 mg/dL (ref 2.5–4.6)

## 2022-06-08 LAB — GLUCOSE, CAPILLARY
Glucose-Capillary: 210 mg/dL — ABNORMAL HIGH (ref 70–99)
Glucose-Capillary: 203 mg/dL — ABNORMAL HIGH (ref 70–99)
Glucose-Capillary: 203 mg/dL — ABNORMAL HIGH (ref 70–99)
Glucose-Capillary: 142 mg/dL — ABNORMAL HIGH (ref 70–99)
Glucose-Capillary: 113 mg/dL — ABNORMAL HIGH (ref 70–99)
Glucose-Capillary: 120 mg/dL — ABNORMAL HIGH (ref 70–99)

## 2022-06-08 LAB — MAGNESIUM: Magnesium: 2 mg/dL (ref 1.7–2.4)

## 2022-06-08 LAB — TRIGLYCERIDES: Triglycerides: 49 mg/dL (ref <150)

## 2022-06-08 MED ORDER — ACETAMINOPHEN 325 MG PO TABS
650.0000 mg | ORAL_TABLET | Freq: Four times a day (QID) | ORAL | Status: DC | PRN
  Administered 2022-06-09: 650 mg via ORAL
  Filled 2022-06-08: qty 2

## 2022-06-08 MED ORDER — INSULIN ASPART 100 UNIT/ML IJ SOLN
0.0000 [IU] | INTRAMUSCULAR | Status: DC
  Administered 2022-06-08: 7 [IU] via SUBCUTANEOUS
  Administered 2022-06-08: 3 [IU] via SUBCUTANEOUS
  Administered 2022-06-09: 4 [IU] via SUBCUTANEOUS
  Administered 2022-06-09: 3 [IU] via SUBCUTANEOUS

## 2022-06-08 NOTE — Progress Notes (Signed)
     Daily Progress Note Intern Pager: [phone]  Patient name: Edward Newton Medical record number: [id number] Date of birth: 03-02-1941 Age: 81 y.o. Gender: male  Primary Care Provider: Rise Patience, DO Consultants: Surgery Code Status: Full  Pt Overview and Major Events to Date:  12/13: Admitted to FMTS service; general surgery consulted 12/15: Ex-lap resulting in small bowel resection 12/19: TPN initiated 12/23: CT with evidence of wound infection, initiated Rocephin/Flagyl  Assessment and Plan: Edward Newton is a 81 y.o. male presenting with abdominal pain and nausea x 2 days and found to have SBO, now s/p ex lap. PMHx includes T2DM, HTN, and dyslipidemia.   * S/P small bowel resection Is tolerating clear liquid diet well.  CMP WNL with exception of slightly elevated ALT at 49.  Has only required a single dose of Dilaudid as needed in the last 24 hours. -General surgery following, appreciate recs -Advance to carb modified diet per surgery -Continue CTX and Flagyl for wound infection (day 3/5) -TPN per pharmacy -Pain management: Dilaudid 0.5-'1mg'$  prn breakthrough pain -PT consulted, ambulate as tolerated  Type 2 diabetes mellitus (HCC) BGL in 100-200s.  Required 5 units SSI in the last 24 hours. -50U insulin in TPN per pharmacy, stop at 1800 and discontinue per surgery -CBG +  resistant SSI  Chronic, stable conditions: BPH: Flomax 0.4 mg daily HLD: Atorvastatin 40 mg daily  FEN/GI: Clear liquid diet PPx: Lovenox Dispo:Home with home health  pending diet advancement .   Subjective:  Patient states he is doing well this morning and he hopes we have a good Christmas.  He tolerated the clear liquid diet well and is feeling better but is having minimal abdominal pain and is requesting further pain medication at this time.  Objective: Temp:  [98.4 F (36.9 C)-99.6 F (37.6 C)] 98.8 F (37.1 C) (12/25 0747) Pulse Rate:  [85-93] 86 (12/25 0747) Resp:  [18-19] 18  (12/25 0650) BP: (133-146)/(66-74) 134/66 (12/25 0747) SpO2:  [94 %-98 %] 98 % (12/25 0747) Physical Exam: General: Awake, alert, NAD Cardiovascular: RRR, no murmurs auscultated Respiratory: CTAB, normal WOB Abdomen: Soft, minimal distention, wound with dressing over it Extremities: No edema appreciated  Laboratory: Most recent CBC Lab Results  Component Value Date   WBC 8.7 06/08/2022   HGB 11.1 (L) 06/08/2022   HCT 33.6 (L) 06/08/2022   MCV 88.7 06/08/2022   PLT 270 06/08/2022   Most recent BMP    Latest Ref Rng & Units 06/08/2022    4:02 AM  BMP  Glucose 70 - 99 mg/dL 175   BUN 8 - 23 mg/dL 18   Creatinine 0.61 - 1.24 mg/dL 1.04   Sodium 135 - 145 mmol/L 139   Potassium 3.5 - 5.1 mmol/L 4.1   Chloride 98 - 111 mmol/L 108   CO2 22 - 32 mmol/L 24   Calcium 8.9 - 10.3 mg/dL 8.3    Magnesium: 2.0 Phosphorus: 3.4  Imaging/Diagnostic Tests: None Wells Guiles, DO 06/08/2022, 12:03 PM  PGY-2, [place] Intern pager: [phone], text pages welcome Secure chat group [place]

## 2022-06-08 NOTE — Progress Notes (Signed)
Patient ID: Edward Newton, male   DOB: [birth year], 81 y.o.   MRN: [id number] 10 Days Post-Op    Subjective: Had BMs and is tolerating PO ROS negative except as listed above. Objective: Vital signs in last 24 hours: Temp:  [98.4 F (36.9 C)-99.6 F (37.6 C)] 98.8 F (37.1 C) (12/25 0747) Pulse Rate:  [85-93] 86 (12/25 0747) Resp:  [18-19] 18 (12/25 0650) BP: (133-146)/(66-74) 134/66 (12/25 0747) SpO2:  [94 %-98 %] 98 % (12/25 0747) Last BM Date : 06/05/22  Intake/Output from previous day: 12/24 0701 - 12/25 0700 In: -  Out: 1250 [Urine:1250] Intake/Output this shift: Total I/O In: -  Out: 400 [Urine:400]  GI: wound cleaner, WTD centrally, soft  Lab Results: CBC  Recent Labs    06/06/22 0450 06/08/22 0402  WBC 11.9* 8.7  HGB 11.5* 11.1*  HCT 36.1* 33.6*  PLT 166 270   BMET Recent Labs    06/07/22 0336 06/08/22 0402  NA 142 139  K 4.1 4.1  CL 110 108  CO2 27 24  GLUCOSE 190* 175*  BUN 18 18  CREATININE 1.03 1.04  CALCIUM 8.2* 8.3*   PT/INR No results for input(s): "LABPROT", "INR" in the last 72 hours. ABG No results for input(s): "PHART", "HCO3" in the last 72 hours.  Invalid input(s): "PCO2", "PO2"  Studies/Results: No results found.  Anti-infectives: Anti-infectives (From admission, onward)    Start     Dose/Rate Route Frequency Ordered Stop   06/06/22 1115  metroNIDAZOLE (FLAGYL) IVPB 500 mg        500 mg 100 mL/hr over 60 Minutes Intravenous Every 12 hours 06/06/22 1029     06/06/22 1000  cefTRIAXone (ROCEPHIN) 2 g in sodium chloride 0.9 % 100 mL IVPB  Status:  Discontinued        2 g 200 mL/hr over 30 Minutes Intravenous Every 24 hours 06/06/22 0914 06/06/22 0914   06/06/22 1000  cefTRIAXone (ROCEPHIN) 2 g in sodium chloride 0.9 % 100 mL IVPB        2 g 200 mL/hr over 30 Minutes Intravenous Every 24 hours 06/06/22 0914 06/11/22 0959   05/29/22 0830  ceFAZolin (ANCEF) IVPB 2g/100 mL premix        2 g 200 mL/hr over 30 Minutes  Intravenous  Once 05/29/22 0738 05/29/22 0945       Assessment/Plan: SBO POD #10 s/p ex lap and SBR Dr. Grandville Silos 05/29/22 - 3 staples removed 12/21 from midline incision for infection - pack BID with 2x2 gauze, ceftriaxone plan 5d - monitor wound - Mobilize, continue PT - CT A/P 12/22 shows ileus and wound infection as above.  - Bowel function returned.  Carb mod diet today    FEN - carb mod VTE - SCDs, lovenox ID - None   - Per TRH -  HTN HLD DM2 Remote hx parotid cancer (2004)  Incidential findings - Indeterminate right hepatic lobe lesion, right renal lesion and right adrenal lesion. Radiology recommending MRI w/ and w/o contrast to f/u this after sbo has resolved.  LOS: 11 days    Georganna Skeans, MD, MPH, FACS Trauma & General Surgery Use [email or web address] to contact on call provider  06/08/2022

## 2022-06-08 NOTE — Progress Notes (Signed)
Mobility Specialist Progress Note:   06/08/22 0859  Mobility  Activity Ambulated independently in room  Level of Assistance Independent  Assistive Device Other (Comment) (IV Pole)  Distance Ambulated (ft) 50 ft  Activity Response Tolerated well  Mobility Referral Yes  $Mobility charge 1 Mobility   Pt received in bed and agreeable to walk to bathroom to have BM. No complaints. Independent with pericare. Pt left in chair with all needs met and call bell in reach.   Andrey Campanile Mobility Specialist Please contact via SecureChat or  Rehab office at [phone]

## 2022-06-08 NOTE — Progress Notes (Signed)
PHARMACY - TOTAL PARENTERAL NUTRITION CONSULT NOTE  Indication: Small bowel obstruction  Patient Measurements: Height: '5\' 10"'$  (177.8 cm) Weight: 74.8 kg (165 lb) IBW/kg (Calculated) : 73 TPN AdjBW (KG): 74.8 Body mass index is 23.68 kg/m. Usual Weight: 75-77 kg  Assessment:  81 yo man with persistent SBO after exploratory laparotomy and small bowel resection on 12/15. Patient has had poor PO intake for > 9 days. Patient was receiving maintenance fluid with more than 100g dextrose per day prior to TPN initiation, which decreases risk for refeeding syndrome. Pharmacy consulted for TPN.   Glucose / Insulin:  A1c 7.4% - CBGs trending up Used 5 units SSI last 24 hrs  Electrolytes: all WNL Renal: SCr 1.04 (BL 1.1), BUN WNL Hepatic: LFTs WNL except ALT at 49, tbili / TG WNL, albumin 2.2 Intake / Output; MIVF:  UOP 1.33m/kg/hr, NGT removed, LBM 12/23 x2  GI Imaging: 12/13 CT: high-grade small-bowel obstruction  12/14 KUB: Ongoing small bowel obstruction.  12/15 KUB: No improvement in small-bowel obstruction since 05/27/2022 12/17 KUB: Dilated loops of small bowel  12/18 CT: concerning for persistent SBO or ileus,  No specific findings to suggest anastomotic leak 12/22 CT: partial SBO with ileus GI Surgeries / Procedures:  12/15 ex lap and SBR   Central access: PICC placed 06/02/22 TPN start date: 06/02/22  Nutritional Goals:  RD Estimated Needs Total Energy Estimated Needs: 1825-2190 kcals Total Protein Estimated Needs: 90-110 gm Total Fluid Estimated Needs: >/= 1.8 L  Current Nutrition:  TPN CLD started 12/24  Plan:  Advance to CMD - wean TPN off per Trauma Reduce TPN to 50 ml/hr, then stop at 1800 Increase SSI to resistant Q4H to cover for PO intake D/C TPN labs and nursing care orders  Shekina Cordell D. DMina Marble PharmD, BCPS, BBroadlands12/25/2023, 9:58 AM

## 2022-06-09 ENCOUNTER — Other Ambulatory Visit (HOSPITAL_COMMUNITY): Payer: Self-pay

## 2022-06-09 DIAGNOSIS — K56609 Unspecified intestinal obstruction, unspecified as to partial versus complete obstruction: Secondary | ICD-10-CM | POA: Diagnosis not present

## 2022-06-09 DIAGNOSIS — Z9049 Acquired absence of other specified parts of digestive tract: Secondary | ICD-10-CM | POA: Diagnosis not present

## 2022-06-09 LAB — GLUCOSE, CAPILLARY
Glucose-Capillary: 146 mg/dL — ABNORMAL HIGH (ref 70–99)
Glucose-Capillary: 139 mg/dL — ABNORMAL HIGH (ref 70–99)
Glucose-Capillary: 166 mg/dL — ABNORMAL HIGH (ref 70–99)

## 2022-06-09 LAB — COMPREHENSIVE METABOLIC PANEL
ALT: 47 U/L — ABNORMAL HIGH (ref 0–44)
AST: 28 U/L (ref 15–41)
Albumin: 2.4 g/dL — ABNORMAL LOW (ref 3.5–5.0)
Alkaline Phosphatase: 59 U/L (ref 38–126)
Anion gap: 6 (ref 5–15)
BUN: 18 mg/dL (ref 8–23)
CO2: 25 mmol/L (ref 22–32)
Calcium: 8.5 mg/dL — ABNORMAL LOW (ref 8.9–10.3)
Chloride: 109 mmol/L (ref 98–111)
Creatinine, Ser: 0.96 mg/dL (ref 0.61–1.24)
GFR, Estimated: >60 mL/min (ref >60)
Glucose, Bld: 152 mg/dL — ABNORMAL HIGH (ref 70–99)
Potassium: 4.7 mmol/L (ref 3.5–5.1)
Sodium: 140 mmol/L (ref 135–145)
Total Bilirubin: 0.6 mg/dL (ref 0.3–1.2)
Total Protein: 6 g/dL — ABNORMAL LOW (ref 6.5–8.1)

## 2022-06-09 MED ORDER — METRONIDAZOLE 500 MG PO TABS
500.0000 mg | ORAL_TABLET | Freq: Two times a day (BID) | ORAL | 0 refills | Status: AC
  Filled 2022-06-09: qty 3, 2d supply, fill #0

## 2022-06-09 MED ORDER — ACETAMINOPHEN 325 MG PO TABS
650.0000 mg | ORAL_TABLET | Freq: Four times a day (QID) | ORAL | 0 refills | Status: AC
  Filled 2022-06-09: qty 24, 3d supply, fill #0

## 2022-06-09 MED ORDER — METRONIDAZOLE 500 MG PO TABS
500.0000 mg | ORAL_TABLET | Freq: Two times a day (BID) | ORAL | 0 refills | Status: DC
  Filled 2022-06-09: qty 3, 2d supply, fill #0

## 2022-06-09 MED ORDER — CEFDINIR 300 MG PO CAPS
300.0000 mg | ORAL_CAPSULE | Freq: Two times a day (BID) | ORAL | 0 refills | Status: AC
  Filled 2022-06-09: qty 2, 1d supply, fill #0

## 2022-06-09 MED ORDER — OXYCODONE HCL 5 MG PO TABS
5.0000 mg | ORAL_TABLET | ORAL | Status: DC | PRN
  Administered 2022-06-09: 10 mg via ORAL
  Filled 2022-06-09: qty 2

## 2022-06-09 MED ORDER — CEFDINIR 300 MG PO CAPS
300.0000 mg | ORAL_CAPSULE | Freq: Two times a day (BID) | ORAL | 0 refills | Status: DC
  Filled 2022-06-09: qty 2, 1d supply, fill #0

## 2022-06-09 NOTE — Progress Notes (Signed)
     Daily Progress Note Intern Pager: [phone]  Patient name: Edward Newton Medical record number: [id number] Date of birth: 10-25-1940 Age: 81 y.o. Gender: male  Primary Care Provider: Rise Patience, DO Consultants: Surgery Code Status: Full  Pt Overview and Major Events to Date:  12/13: Admitted to FMTS service; general surgery consulted 12/15: Ex-lap resulting in small bowel resection 12/19: TPN initiated 12/23: CT with evidence of wound infection, initiated Rocephin/Flagyl  Assessment and Plan: Edward Newton is a 81 y.o. male presenting with abdominal pain and nausea x 2 days and found to have SBO, now s/p ex lap. PMHx includes T2DM (diet controlled), HTN, and dyslipidemia.  * S/P small bowel resection Tolerating carb modified diet.  Is infrequently needed Dilaudid for pain.  -General surgery following, appreciate recs -Advance to regular diet per surgery -Continue CTX and Flagyl for wound infection (day 4/5) -Pain management: Tylenol 650 mg every 6 hours as needed, discontinue Dilaudid -PT consulted, ambulate as tolerated  Type 2 diabetes mellitus (HCC) BGL in 100-200s. TPN discontinued yesterday. -CBG 4x daily, with meals and at bedtime, -resistant SSI  Chronic, stable conditions: BPH: Flomax 0.4 mg daily HLD: Atorvastatin 40 mg daily  FEN/GI: Carb modified PPx: Lovenox Dispo:Home with home health  pending diet advancement, surgical clearance .   Subjective:  Patient states he is doing well this morning, he had a bowel movement this morning.  Endorses some abdominal pain but his IV site is getting changed and he is unsure if the pain is worse there or abdominally.  States he just received some pain medication which was helpful.  Objective: Temp:  [98 F (36.7 C)-98.4 F (36.9 C)] 98.4 F (36.9 C) (12/26 0823) Pulse Rate:  [89-99] 89 (12/26 0823) Resp:  [14-17] 17 (12/26 0823) BP: (120-138)/(69-76) 120/69 (12/26 0823) SpO2:  [94 %-100 %] 94 % (12/26  2751) Physical Exam: General: Awake, alert, NAD Cardiovascular: RRR, no murmurs auscultated Respiratory: CTAB, normal WOB Abdomen: Soft, minimal distention, tender over incision site Extremities: No edema appreciated  Laboratory: Most recent CBC Lab Results  Component Value Date   WBC 8.7 06/08/2022   HGB 11.1 (L) 06/08/2022   HCT 33.6 (L) 06/08/2022   MCV 88.7 06/08/2022   PLT 270 06/08/2022   Most recent BMP    Latest Ref Rng & Units 06/09/2022    4:54 AM  BMP  Glucose 70 - 99 mg/dL 152   BUN 8 - 23 mg/dL 18   Creatinine 0.61 - 1.24 mg/dL 0.96   Sodium 135 - 145 mmol/L 140   Potassium 3.5 - 5.1 mmol/L 4.7   Chloride 98 - 111 mmol/L 109   CO2 22 - 32 mmol/L 25   Calcium 8.9 - 10.3 mg/dL 8.5    Imaging/Diagnostic Tests: None Wells Guiles, DO 06/09/2022, 10:30 AM  PGY-2, [place] Intern pager: [phone], text pages welcome Secure chat group [place]

## 2022-06-09 NOTE — Discharge Summary (Cosign Needed Addendum)
[facility] Discharge Summary  Patient name: [facility] Medical record number: [id number] Date of birth: [birth year] Age: 81 y.o. Gender: male Date of Admission: 05/27/2022  Date of Discharge: 06/09/2022 Admitting Physician: Martyn Malay, MD  Primary Care Provider: Rise Patience, DO Consultants: Surgery  Indication for Hospitalization: SBO  Discharge Diagnoses/Problem List:  Principal Problem for Admission: SBO Other Problems addressed during stay:  Principal Problem:   S/P small bowel resection Active Problems:   Small bowel obstruction (HCC)   Hypernatremia   Type 2 diabetes mellitus ([place])   Small bowel obstruction due to adhesions [facility])  Brief Hospital Course:  Edward Newton is a 81 y.o.male with a history of T2DM, HTN, and HLD who was admitted to the [facility] Medicine Teaching Service at [facility] for abdominal pain and found to have SBO. His hospital course is detailed below:  S/p ex-lap, small bowel resection with anastomosis 2/2 SBO Presented with abdominal pain x 2 days and found to have SBO without perforation. General surgery consulted and NG was placed. His abdomen became more distended and general surgery opted for ex-lap on 12/15 resulting in small bowel resection due to adherent portion of the bowel. Bowel was anastomosed and patient was continued on NG with suction. Patient with persistent abdominal distension and foul drainage from incision site post-op, general surgery opted for imaging that showed ileus vs additional SBO. Pt was started on CTX and flagyl x 5 days. He was started on TPN on 12/19 and this was discontinued 12/25.  Patient's diet was advanced as tolerated he was surgically cleared. At discharge, pt was tolerating carb modified diet.  His staples were removed prior to discharge.  He was provided DME 3 and 1 and walker in addition to handicap placard.  Patient was discharged with 1 day of cefdinir and Flagyl oral to complete  5-day course.  AKI Admitted with Cr 1.5 (baseline ~1.1) likely in the setting of poor PO intake. AKI resolved with fluid resuscitation.  Other chronic conditions were medically managed with home medications and formulary alternatives as necessary (BPH, T2DM)  PCP Follow-up Recommendations:  Hepatic lobe and adrenal lesions- will need MRI abdomen without and with contrast for f/u Ensure surgical follow-up.  Patient was given instructions for this and discharge instructions by surgical team. HCTZ was held during hospitalization given normotensive pressures.  Restart if necessary.  Disposition: Home  Discharge Condition: Stable  Discharge Exam:  Vitals:   06/09/22 0450 06/09/22 0823  BP: 124/72 120/69  Pulse: 99 89  Resp: 14 17  Temp: 98 F (36.7 C) 98.4 F (36.9 C)  SpO2: 99% 94%  General: Awake, alert, NAD CV: RRR, no murmurs auscultated Respiratory: CTAB, normal WOB Abdomen: Soft, minimal distention, tender over incision site Extremities: No edema appreciated  Significant Procedures: Ex lap with small bowel resection with anastomosis  Significant Labs and Imaging:  Recent Labs  Lab 06/08/22 0402  WBC 8.7  HGB 11.1*  HCT 33.6*  PLT 270   Recent Labs  Lab 06/08/22 0402 06/09/22 0454  NA 139 140  K 4.1 4.7  CL 108 109  CO2 24 25  GLUCOSE 175* 152*  BUN 18 18  CREATININE 1.04 0.96  CALCIUM 8.3* 8.5*  MG 2.0  --   PHOS 3.4  --   ALKPHOS 53 59  AST 24 28  ALT 49* 47*  ALBUMIN 2.2* 2.4*   Results/Tests Pending at Time of Discharge: None  Discharge Medications:  Allergies as of 06/09/2022  Reactions   Lisinopril    Lip swelling         Medication List     STOP taking these medications    hydrochlorothiazide 12.5 MG tablet Commonly known as: HYDRODIURIL   meloxicam 15 MG tablet Commonly known as: MOBIC   sodium-potassium bicarbonate Tbef dissolvable tablet Commonly known as: ALKA-SELTZER GOLD       TAKE these medications     acetaminophen 325 MG tablet Commonly known as: TYLENOL Take 2 tablets (650 mg total) by mouth every 6 (six) hours for 3 days.   atorvastatin 40 MG tablet Commonly known as: LIPITOR TAKE 1 TABLET BY MOUTH  DAILY What changed: when to take this   cefdinir 300 MG capsule Commonly known as: OMNICEF Take 1 capsule (300 mg total) by mouth 2 (two) times daily for 2 doses. Start taking on: June 10, 2022   metroNIDAZOLE 500 MG tablet Commonly known as: Flagyl Take 1 tablet (500 mg total) by mouth 2 (two) times daily for 2 days.   tamsulosin 0.4 MG Caps capsule Commonly known as: FLOMAX Take 0.4 mg by mouth every evening.   triamcinolone ointment 0.1 % Commonly known as: KENALOG Apply 1 application topically 2 (two) times daily.               Durable Medical Equipment  (From admission, onward)           Start     Ordered   06/09/22 1015  For home use only DME 3 n 1  Once       Comments: For use of shower sitting and railings to assist with standing after using toilet.   06/09/22 1015   05/30/22 1454  For home use only DME Walker  Once       Question:  Patient needs a walker to treat with the following condition  Answer:  Difficulty walking   05/30/22 1453              Discharge Care Instructions  (From admission, onward)           Start     Ordered   06/09/22 0000  Discharge wound care:       Comments: According to instructions on discharge instructions   06/09/22 1525           Discharge Instructions: Please refer to Patient Instructions section of EMR for full details.  Patient was counseled important signs and symptoms that should prompt return to medical care, changes in medications, dietary instructions, activity restrictions, and follow up appointments.   Follow-Up Appointments:  Follow-up Information     Zola Button, MD Follow up on 06/11/2022.   Specialty: Family Medicine Why: 3:10 PM (arrive at 2:50) Contact information: [place] [id number] [phone]         Georganna Skeans, MD. Go on 07/08/2022.   Specialty: General Surgery Why: 9:00 AM. Please arrive 30 min prior to appointment time to check in. Have ID and insurance information with you. Contact information: [address] [place] [address] [phone]                 Wells Guiles, DO 06/09/2022, 3:31 PM PGY-2, [place]

## 2022-06-09 NOTE — Progress Notes (Signed)
Progress Note  11 Days Post-Op  Subjective: Pt tolerating diet and having bowel function. Still taking some IV pain meds but reports only mild abdominal pain - no PO pain meds were ordered. Discussed trying oxycodone for pain control today. Patient reports he will go home with his wife when discharged. Reviewed post-operative instructions.   Objective: Vital signs in last 24 hours: Temp:  [98 F (36.7 C)-98.4 F (36.9 C)] 98.4 F (36.9 C) (12/26 0823) Pulse Rate:  [89-99] 89 (12/26 0823) Resp:  [14-17] 17 (12/26 0823) BP: (120-138)/(69-76) 120/69 (12/26 0823) SpO2:  [94 %-100 %] 94 % (12/26 0823) Last BM Date : 06/05/22  Intake/Output from previous day: 12/25 0701 - 12/26 0700 In: 900 [IV Piggyback:900] Out: 700 [Urine:700] Intake/Output this shift: No intake/output data recorded.  PE: General: pleasant, WD, elderly male who is laying in bed in NAD Lungs: Respiratory effort nonlabored Abd: soft, NT, ND, +BS, midline incision open inferiorly with some thin cloudy drainage, no erythema or induration of upper incision, staples remain in upper incision Psych: A&Ox3 with an appropriate affect.    Lab Results:  Recent Labs    06/08/22 0402  WBC 8.7  HGB 11.1*  HCT 33.6*  PLT 270   BMET Recent Labs    06/08/22 0402 06/09/22 0454  NA 139 140  K 4.1 4.7  CL 108 109  CO2 24 25  GLUCOSE 175* 152*  BUN 18 18  CREATININE 1.04 0.96  CALCIUM 8.3* 8.5*   PT/INR No results for input(s): "LABPROT", "INR" in the last 72 hours. CMP     Component Value Date/Time   NA 140 06/09/2022 0454   NA 140 06/25/2021 0945   K 4.7 06/09/2022 0454   CL 109 06/09/2022 0454   CO2 25 06/09/2022 0454   GLUCOSE 152 (H) 06/09/2022 0454   BUN 18 06/09/2022 0454   BUN 16 06/25/2021 0945   CREATININE 0.96 06/09/2022 0454   CREATININE 1.27 (H) 05/14/2016 1534   CALCIUM 8.5 (L) 06/09/2022 0454   PROT 6.0 (L) 06/09/2022 0454   PROT 6.8 02/02/2020 1017   ALBUMIN 2.4 (L) 06/09/2022 0454    ALBUMIN 4.1 02/02/2020 1017   AST 28 06/09/2022 0454   ALT 47 (H) 06/09/2022 0454   ALKPHOS 59 06/09/2022 0454   BILITOT 0.6 06/09/2022 0454   BILITOT 0.7 02/02/2020 1017   GFRNONAA >60 06/09/2022 0454   GFRNONAA 56 (L) 05/14/2016 1534   GFRAA 58 (L) 02/02/2020 1017   GFRAA 64 05/14/2016 1534   Lipase     Component Value Date/Time   LIPASE 23 05/27/2022 1009       Studies/Results: No results found.  Anti-infectives: Anti-infectives (From admission, onward)    Start     Dose/Rate Route Frequency Ordered Stop   06/06/22 1115  metroNIDAZOLE (FLAGYL) IVPB 500 mg        500 mg 100 mL/hr over 60 Minutes Intravenous Every 12 hours 06/06/22 1029     06/06/22 1000  cefTRIAXone (ROCEPHIN) 2 g in sodium chloride 0.9 % 100 mL IVPB  Status:  Discontinued        2 g 200 mL/hr over 30 Minutes Intravenous Every 24 hours 06/06/22 0914 06/06/22 0914   06/06/22 1000  cefTRIAXone (ROCEPHIN) 2 g in sodium chloride 0.9 % 100 mL IVPB        2 g 200 mL/hr over 30 Minutes Intravenous Every 24 hours 06/06/22 0914 06/11/22 0959   05/29/22 0830  ceFAZolin (ANCEF) IVPB 2g/100 mL premix  2 g 200 mL/hr over 30 Minutes Intravenous  Once 05/29/22 0738 05/29/22 0945        Assessment/Plan  SBO POD #11 s/p ex lap and SBR Dr. Grandville Silos 05/29/22 - 3 staples removed 12/21 from midline incision for infection - draining well, cover with dry dressing BID or prn for saturation - Mobilize, continue PT - CT A/P 12/22 shows ileus and wound infection as above.  - tolerating diet and having bowel function  - ordered PO pain meds since patient still taking some IV in addition to PO tylenol - stable for DC from a surgery standpoint if pain well controlled later today. Ok to remove remaining staples prior to discharge and apply steri-strips. Follow up and post-op instructions in AVS     FEN - carb mod VTE - SCDs, lovenox ID - None   - Per TRH -  HTN HLD DM2 Remote hx parotid cancer (2004)   Incidential findings - Indeterminate right hepatic lobe lesion, right renal lesion and right adrenal lesion. Radiology recommending MRI w/ and w/o contrast to f/u this after sbo has resolved.   LOS: 12 days    Norm Parcel, [facility] Surgery 06/09/2022, 9:24 AM Please see Amion for pager number during day hours 7:00am-4:30pm

## 2022-06-09 NOTE — Progress Notes (Signed)
Physical Therapy Treatment Patient Details Name: Edward Newton MRN: [id number] DOB: 08-04-1940 Today's Date: 06/09/2022   History of Present Illness 81 y.o. male admitted 12/13 with abdominal pain and nausea found to have SBO. s/p small bowel resection 12/15. CT 12/22-ileus. 12/23- wound infection. PMHx: HTN,  BPH,  Right knee OA ,  T2DM,  Primary OA left hip.    PT Comments    Patient progressing well towards PT goals. Session focused on ambulation without use of DME and stair training to prepare for d/c home today. Pt with only 1 instance of staggering once RW was removed but able to self correct and then ambulating Mod I without difficulty with no balance deficits. Tolerated negotiating 2 flights of stairs using rail without difficulty. 1 episode of dizziness at top which resolved quickly. Pt will have support from spouse at home. Pt prefers using RW for safety so recommending one for home. All goals are met and pt functioning at Mod I level. All education completed. Discharge from therapy.   Recommendations for follow up therapy are one component of a multi-disciplinary discharge planning process, led by the attending physician.  Recommendations may be updated based on patient status, additional functional criteria and insurance authorization.  Follow Up Recommendations  No PT follow up     Assistance Recommended at Discharge Intermittent Supervision/Assistance  Patient can return home with the following Assist for transportation;Assistance with cooking/housework   Equipment Recommendations  Rolling walker (2 wheels)    Recommendations for Other Services       Precautions / Restrictions Precautions Precautions: None Restrictions Weight Bearing Restrictions: No     Mobility  Bed Mobility               General bed mobility comments: Sitting EOB upon PT arrival.    Transfers Overall transfer level: Needs assistance Equipment used: None Transfers: Sit to/from  Stand Sit to Stand: Modified independent (Device/Increase time)           General transfer comment: Stood from EOB without difficulty.    Ambulation/Gait Ambulation/Gait assistance: Modified independent (Device/Increase time) Gait Distance (Feet): 500 Feet Assistive device: Rolling walker (2 wheels), None Gait Pattern/deviations: Step-through pattern, Decreased stride length   Gait velocity interpretation: >2.62 ft/sec, indicative of community ambulatory   General Gait Details: Steady gait with use of RW, took away DME and pt with only mild stagger initially but then improved to Mod I as well just slower speed. Prefers using RW for safety.   Stairs Stairs: Yes Stairs assistance: Modified independent (Device/Increase time) Stair Management: One rail Right, Alternating pattern, Forwards Number of Stairs: 20 General stair comments: Steady gait with episode of mild dizziness once at top of second flight which resolved quickly.   Wheelchair Mobility    Modified Rankin (Stroke Patients Only)       Balance Overall balance assessment: Modified Independent                                          Cognition Arousal/Alertness: Awake/alert Behavior During Therapy: WFL for tasks assessed/performed Overall Cognitive Status: Within Functional Limits for tasks assessed                                 General Comments: pleasant and cooperative        Exercises  General Comments General comments (skin integrity, edema, etc.): Only 1 instance of stagger when initially taking away RW but then mod I and steady with ambulation.      Pertinent Vitals/Pain Pain Assessment Pain Assessment: Faces Faces Pain Scale: Hurts a little bit Pain Location: right upper abdomen Pain Descriptors / Indicators: Sore, Discomfort, Grimacing Pain Intervention(s): Monitored during session, Repositioned    Home Living                          Prior  Function            PT Goals (current goals can now be found in the care plan section) Progress towards PT goals: Goals met/education completed, patient discharged from PT    Frequency    Min 3X/week      PT Plan Current plan remains appropriate    Co-evaluation              AM-PAC PT "6 Clicks" Mobility   Outcome Measure  Help needed turning from your back to your side while in a flat bed without using bedrails?: None Help needed moving from lying on your back to sitting on the side of a flat bed without using bedrails?: None Help needed moving to and from a bed to a chair (including a wheelchair)?: None Help needed standing up from a chair using your arms (e.g., wheelchair or bedside chair)?: None Help needed to walk in hospital room?: A Little Help needed climbing 3-5 steps with a railing? : A Little 6 Click Score: 22    End of Session   Activity Tolerance: Patient tolerated treatment well Patient left: Other (comment) (left standing in room) Nurse Communication: Mobility status PT Visit Diagnosis: Muscle weakness (generalized) (M62.81);Pain Pain - part of body:  (abdomen)     Time: 1040-1059 PT Time Calculation (min) (ACUTE ONLY): 19 min  Charges:  $Gait Training: 8-22 mins                     Marisa Severin, PT, DPT Acute Rehabilitation Services Secure chat preferred Office [place] 06/09/2022, 11:30 AM

## 2022-06-09 NOTE — Progress Notes (Signed)
Abdominal surgical site's, distal location dehisced with yellowish drainage. Dressed by surgical team this morning. Dressing is saturated, removed, cleaned and new dressing applied. Staples at the surgical sites are removed as order. Educated patient on how to take care the surgical sites at home. In addition on what "not to do; no bending, twisting and to avoid going up and down the staircase." Offered extra gauze and tape to take home. CN implemented education while reviewing discharge instructions with patient and family. Both verbalized understanding. Picc team consulted for picc line removal.

## 2022-06-10 ENCOUNTER — Telehealth: Payer: Self-pay

## 2022-06-10 NOTE — Patient Outreach (Signed)
  Care Coordination TOC Note Transition Care Management Follow-up Telephone Call Date of discharge and from where: [facility] 05/27/22-06/09/22 How have you been since you were released from the hospital? "I am feeling pretty good" Any questions or concerns? Yes-spouse concerned about the drainage from the wound as they changed his dressing today.  Denies purulent drainage.  Provided phone number for surgeon's office and encouraged them to call surgeon to discuss with him, ? Order [facility]  Items Reviewed: Did the pt receive and understand the discharge instructions provided? Yes  Medications obtained and verified? Yes  Other? No  Any new allergies since your discharge? No  Dietary orders reviewed? Yes Do you have support at home? Yes   Home Care and Equipment/Supplies: Were home health services ordered? no If so, what is the name of the agency? N/a  Has the agency set up a time to come to the patient's home? no Were any new equipment or medical supplies ordered?  No What is the name of the medical supply agency? N/a Were you able to get the supplies/equipment? not applicable Do you have any questions related to the use of the equipment or supplies? No  Functional Questionnaire: (I = Independent and D = Dependent) ADLs: needs assistance  Bathing/Dressing- needs assistance  Meal Prep- D  Eating- I  Maintaining continence- I  Transferring/Ambulation- needs assistance  Managing Meds- I  Follow up appointments reviewed:  PCP Hospital f/u appt confirmed? Yes  Scheduled to see Dr. Nancy Fetter on 06/11/22 @ 3:10. [place] [facility] f/u appt confirmed? Yes  Scheduled to see Dr. Lavone Neri on 07/08/22 @ 0900. Are transportation arrangements needed? No  If their condition worsens, is the pt aware to call PCP or go to the Emergency Dept.? Yes Was the patient provided with contact information for the PCP's office or ED? Yes Was to pt encouraged to call back with questions or concerns?  Yes  SDOH assessments and interventions completed:   No SDOH Interventions Today    Flowsheet Row Most Recent Value  SDOH Interventions   Housing Interventions Intervention Not Indicated  Transportation Interventions Intervention Not Indicated  Financial Strain Interventions Intervention Not Indicated       Care Coordination Interventions:  No Care Coordination interventions needed at this time.   Encounter Outcome:  Pt. Visit Completed

## 2022-06-11 ENCOUNTER — Encounter: Payer: Self-pay | Admitting: Family Medicine

## 2022-06-11 ENCOUNTER — Ambulatory Visit (INDEPENDENT_AMBULATORY_CARE_PROVIDER_SITE_OTHER): Payer: Medicare Other | Admitting: Family Medicine

## 2022-06-11 VITALS — BP 134/78 | HR 84 | Wt 153.2 lb

## 2022-06-11 DIAGNOSIS — Z09 Encounter for follow-up examination after completed treatment for conditions other than malignant neoplasm: Secondary | ICD-10-CM

## 2022-06-11 DIAGNOSIS — K565 Intestinal adhesions [bands], unspecified as to partial versus complete obstruction: Secondary | ICD-10-CM

## 2022-06-11 DIAGNOSIS — N289 Disorder of kidney and ureter, unspecified: Secondary | ICD-10-CM

## 2022-06-11 DIAGNOSIS — E278 Other specified disorders of adrenal gland: Secondary | ICD-10-CM | POA: Diagnosis not present

## 2022-06-11 DIAGNOSIS — K769 Liver disease, unspecified: Secondary | ICD-10-CM | POA: Diagnosis not present

## 2022-06-11 DIAGNOSIS — D1803 Hemangioma of intra-abdominal structures: Secondary | ICD-10-CM | POA: Insufficient documentation

## 2022-06-11 DIAGNOSIS — E279 Disorder of adrenal gland, unspecified: Secondary | ICD-10-CM

## 2022-06-11 DIAGNOSIS — N281 Cyst of kidney, acquired: Secondary | ICD-10-CM | POA: Insufficient documentation

## 2022-06-11 DIAGNOSIS — E119 Type 2 diabetes mellitus without complications: Secondary | ICD-10-CM

## 2022-06-11 LAB — POCT GLYCOSYLATED HEMOGLOBIN (HGB A1C): HbA1c, POC (controlled diabetic range): 7.2 % — AB (ref 0.0–7.0)

## 2022-06-11 NOTE — Patient Instructions (Addendum)
It was nice seeing you today!  Make sure to follow-up with the surgeon as scheduled on January 24th.  Try to increase the dressing changes to twice a day. You can use a Q-tip to remove excess drainage.  For pain, you can take Tylenol and/or ibuprofen as needed.  Look for signs of infection such as redness or increased pain or swelling.  Make sure to follow-up with your PCP for your other medical issues.  Stay well, Edward Button, MD [place] [phone]  --  Make sure to check out at the front desk before you leave today.  Please arrive at least 15 minutes prior to your scheduled appointments.  If you had blood work today, I will send you a MyChart message or a letter if results are normal. Otherwise, I will give you a call.  If you had a referral placed, they will call you to set up an appointment. Please give [place] a call if you don't hear back in the next 2 weeks.  If you need additional refills before your next appointment, please call your pharmacy first.

## 2022-06-11 NOTE — Assessment & Plan Note (Signed)
Incidental finding of adrenal nodule, hepatic lesion, and renal lesion noted on CT scan during hospitalization.  Will evaluate further with MRI abdomen as recommended.

## 2022-06-11 NOTE — Assessment & Plan Note (Signed)
>>  ASSESSMENT AND PLAN FOR SMALL BOWEL OBSTRUCTION DUE TO ADHESIONS ([place]) WRITTEN ON 06/11/2022  8:57 PM BY SUN, RICHARD, MD  Seems to be recovering well s/p exploratory laparotomy.  Pain is improving and he is tolerating diet.  Unsure what abdominal wound looks like during hospitalization but appears to have a bit of dehiscence, otherwise healing well with no overt signs of infection. - advised to increase to twice daily dressing changes as per discharge instructions - can use Q-tip to drain excess fluid if needed - Tylenol and/or ibuprofen as needed for pain - advised to monitor for signs of infection - reminded of general surgery appointment next month

## 2022-06-11 NOTE — Progress Notes (Signed)
SUBJECTIVE:   CHIEF COMPLAINT / HPI:  Chief Complaint  Patient presents with   Follow-up    hospital    Patient was recently admitted from 12/13 to 12/26 for a small bowel obstruction.  He underwent exploratory laparotomy and small bowel resection on 12/15.  He was treated for abdominal wound infection with IV antibiotics.  He did require TPN during hospitalization.  PCP Follow-up Recommendations:  Hepatic lobe and adrenal lesions- will need MRI abdomen without and with contrast for f/u Ensure surgical follow-up.  Patient was given instructions for this and discharge instructions by surgical team. HCTZ was held during hospitalization given normotensive pressures.  Restart if necessary.  Patient is here with his wife today. He reports that he has noticed increase drainage of yellowish/greenish color from his abdominal wound for the past 2 days. It soaked the bandage last night. He has been having his dressing changed once a day. He has had some abdominal pain on his right lower side that started after he left the hospital. He reports he has been able to eat some, appetite is fair. Reports he has had good bowel movements daily.  He has not been checking his BP at home.  PERTINENT  PMH / PSH: T2DM, HTN, HLD  Patient Care Team: Rise Patience, DO as PCP - General (Family Medicine)   OBJECTIVE:   BP 134/78   Pulse 84   Wt 153 lb 4 oz (69.5 kg)   SpO2 97%   BMI 21.99 kg/m   Physical Exam Constitutional:      General: He is not in acute distress. HENT:     Head: Normocephalic and atraumatic.  Eyes:     Extraocular Movements: Extraocular movements intact.  Cardiovascular:     Rate and Rhythm: Normal rate and regular rhythm.  Pulmonary:     Effort: Pulmonary effort is normal. No respiratory distress.     Breath sounds: Normal breath sounds.  Abdominal:     General: Bowel sounds are normal.     Palpations: Abdomen is soft.     Tenderness: There is no abdominal tenderness.   Skin:    Comments: Abdominal incision wound without surrounding erythema.  There is a small amount of thin yellow fluid within the wound cavity.  Neurological:     Mental Status: He is alert.         {Show previous vital signs (optional):23777}  Last hemoglobin A1c Lab Results  Component Value Date   HGBA1C 7.2 (A) 06/11/2022      ASSESSMENT/PLAN:   Hospital discharge follow-up  Type 2 diabetes mellitus, controlled ([place]) Diet-controlled. A1c updated today, remains adequately controlled.  Small bowel obstruction due to adhesions (HCC) Seems to be recovering well s/p exploratory laparotomy.  Pain is improving and he is tolerating diet.  Unsure what abdominal wound looks like during hospitalization but appears to have a bit of dehiscence, otherwise healing well with no overt signs of infection. - advised to increase to twice daily dressing changes as per discharge instructions - can use Q-tip to drain excess fluid if needed - Tylenol and/or ibuprofen as needed for pain - advised to monitor for signs of infection - reminded of general surgery appointment next month  Adrenal nodule ([place]) Incidental finding of adrenal nodule, hepatic lesion, and renal lesion noted on CT scan during hospitalization.  Will evaluate further with MRI abdomen as recommended.    Return if symptoms worsen or fail to improve.   Zola Button, MD [place]

## 2022-06-11 NOTE — Assessment & Plan Note (Signed)
Diet-controlled. A1c updated today, remains adequately controlled.

## 2022-06-11 NOTE — Assessment & Plan Note (Addendum)
Seems to be recovering well s/p exploratory laparotomy.  Pain is improving and he is tolerating diet.  Unsure what abdominal wound looks like during hospitalization but appears to have a bit of dehiscence, otherwise healing well with no overt signs of infection. - advised to increase to twice daily dressing changes as per discharge instructions - can use Q-tip to drain excess fluid if needed - Tylenol and/or ibuprofen as needed for pain - advised to monitor for signs of infection - reminded of general surgery appointment next month

## 2022-06-18 ENCOUNTER — Telehealth: Payer: Self-pay

## 2022-06-18 NOTE — Telephone Encounter (Signed)
Patients wife calls nurse line reporting possible incision infection.   She reports the wound started producing whitish discharge with an odor yesterday.   She denies and body aches or fevers.   Patient has an apt with surgeon on 07/08/2022.   Patient scheduled for tomorrow at [facility] for evaluation.   ED precautions given in the meantime.

## 2022-06-19 ENCOUNTER — Encounter: Payer: Self-pay | Admitting: Family Medicine

## 2022-06-19 ENCOUNTER — Other Ambulatory Visit (HOSPITAL_COMMUNITY): Payer: Self-pay | Admitting: Student

## 2022-06-19 ENCOUNTER — Ambulatory Visit (INDEPENDENT_AMBULATORY_CARE_PROVIDER_SITE_OTHER): Payer: Medicare Other | Admitting: Family Medicine

## 2022-06-19 VITALS — BP 122/74 | HR 91 | Temp 99.0°F | Wt 148.2 lb

## 2022-06-19 DIAGNOSIS — T81321A Disruption or dehiscence of closure of internal operation (surgical) wound of abdominal wall muscle or fascia, initial encounter: Secondary | ICD-10-CM | POA: Insufficient documentation

## 2022-06-19 DIAGNOSIS — L24A9 Irritant contact dermatitis due friction or contact with other specified body fluids: Secondary | ICD-10-CM

## 2022-06-19 DIAGNOSIS — T148XXA Other injury of unspecified body region, initial encounter: Secondary | ICD-10-CM

## 2022-06-19 DIAGNOSIS — T8130XA Disruption of wound, unspecified, initial encounter: Secondary | ICD-10-CM | POA: Insufficient documentation

## 2022-06-19 DIAGNOSIS — T8130XD Disruption of wound, unspecified, subsequent encounter: Secondary | ICD-10-CM | POA: Diagnosis not present

## 2022-06-19 HISTORY — DX: Disruption or dehiscence of closure of internal operation (surgical) wound of abdominal wall muscle or fascia, initial encounter: T81.321A

## 2022-06-19 NOTE — Assessment & Plan Note (Signed)
Subsequent encounter for visual wound dehiscence after small bowel obstruction status post ex lap and postop infection treated with antibiotics.  Changes from prior visit with Dr. Nancy Fetter include new upper surgical site superficial dehiscence with purulent malodorous drainage.  Patient does not have systemic symptoms or obvious fluctuance for erythema and swelling of abdominal wound, because of this it is unlikely wound is currently infected. However it is uncertain whether wound dehiscence is only superficial and would best be evaluated by and would best be evaluated by general surgery.  Plan to reach out to [facility] to see if patient can follow-up earlier later this afternoon. -Spoke with triage nurse from [facility] over the phone, stated they would call patient to have him come in for appointment at 3:15 this afternoon -Will call patient to confirm that that appointment is set up

## 2022-06-19 NOTE — Patient Instructions (Signed)
It was great to see you! Thank you for allowing me to participate in your care!  I recommend that you always bring your medications to each appointment as this makes it easy to ensure we are on the correct medications and helps [place] not miss when refills are needed.  Our plans for today:  - I spoke with the [facility] and they stated they would call you regarding an appointment later today. - You may clean with the antibiotic spray as you have been, and as wells a gently wash the area. You may gently apply Bacitracin to the wounds with cue tip.   Take care and seek immediate care sooner if you develop any concerns.   Dr. Salvadore Oxford, MD [place]

## 2022-06-19 NOTE — Progress Notes (Signed)
    SUBJECTIVE:   CHIEF COMPLAINT / HPI: wound healing  Patient reports still having good bowel movements most recently this morning.  No fevers or chills.  Some appetite decreased.  Since increasing dressing changes twice daily since visit with Edward Newton, there is now upper part of wound that has drainage.  Patient and wife state that it is malodorous.  Denies any redness or swelling or increased pain.  Telephone call on 06/18/21 wound is reportedly producing a whitish discharge with an odor.  Previously seen by Edward Newton for hospital follow-up from bowel obstruction s/p exploratory laparotomy with noted wound dehiscence on 12/28. Increased dressing changes at that time.   PERTINENT  PMH / PSH: SBO, T2DM  OBJECTIVE:   BP 122/74   Pulse 91   Temp 99 F (37.2 C) (Oral)   Wt 148 lb 4 oz (67.2 kg)   SpO2 96%   BMI 21.27 kg/m   General: NAD, lying comfortably on exam table Cardiovascular: RRR, no murmurs, no peripheral edema Respiratory: normal WOB on RA, CTAB, no wheezes, ronchi or rales Abdomen: soft, appropriately tender to palpation around surgical scar, no erythema, no fluctuance, purulent white drainage from upper dehiscence (see photo below) no rebound or guarding Extremities: Moving all 4 extremities equally     ASSESSMENT/PLAN:   Abdominal wound dehiscence Subsequent encounter for visual wound dehiscence after small bowel obstruction status post ex lap and postop infection treated with antibiotics.  Changes from prior visit with Edward Newton include new upper surgical site superficial dehiscence with purulent malodorous drainage.  Patient does not have systemic symptoms or obvious fluctuance for erythema and swelling of abdominal wound, because of this it is unlikely wound is currently infected. However it is uncertain whether wound dehiscence is only superficial and would best be evaluated by and would best be evaluated by general surgery.  Plan to reach out to [facility] to see  if patient can follow-up earlier later this afternoon. -Spoke with triage nurse from [facility] over the phone, stated they would call patient to have him come in for appointment at 3:15 this afternoon -Will call patient to confirm that that appointment is set up   Update: Spoke to [name] on the phone around 1:35. She states they were called by [facility] and have appointment for 3:15pm today.  Edward Oxford, MD [place]

## 2022-06-22 ENCOUNTER — Ambulatory Visit (HOSPITAL_BASED_OUTPATIENT_CLINIC_OR_DEPARTMENT_OTHER)
Admission: RE | Admit: 2022-06-22 | Discharge: 2022-06-22 | Disposition: A | Discharge status: Home / Self Care | Payer: Medicare Other | Source: Ambulatory Visit | Attending: Student | Admitting: Student

## 2022-06-22 DIAGNOSIS — N400 Enlarged prostate without lower urinary tract symptoms: Secondary | ICD-10-CM | POA: Insufficient documentation

## 2022-06-22 DIAGNOSIS — K769 Liver disease, unspecified: Secondary | ICD-10-CM | POA: Diagnosis not present

## 2022-06-22 DIAGNOSIS — I700 Atherosclerosis of aorta: Secondary | ICD-10-CM | POA: Diagnosis not present

## 2022-06-22 DIAGNOSIS — K8020 Calculus of gallbladder without cholecystitis without obstruction: Secondary | ICD-10-CM | POA: Insufficient documentation

## 2022-06-22 DIAGNOSIS — L24A9 Irritant contact dermatitis due friction or contact with other specified body fluids: Secondary | ICD-10-CM | POA: Diagnosis not present

## 2022-06-22 MED ORDER — IOHEXOL 300 MG/ML  SOLN
100.0000 mL | Freq: Once | INTRAMUSCULAR | Status: AC | PRN
  Administered 2022-06-22: 100 mL via INTRAVENOUS

## 2022-06-29 ENCOUNTER — Ambulatory Visit (HOSPITAL_COMMUNITY)
Admission: RE | Admit: 2022-06-29 | Discharge: 2022-06-29 | Disposition: A | Discharge status: Home / Self Care | Payer: Medicare Other | Source: Ambulatory Visit | Attending: Family Medicine | Admitting: Family Medicine

## 2022-06-29 DIAGNOSIS — K862 Cyst of pancreas: Secondary | ICD-10-CM | POA: Diagnosis not present

## 2022-06-29 DIAGNOSIS — N281 Cyst of kidney, acquired: Secondary | ICD-10-CM | POA: Diagnosis not present

## 2022-06-29 DIAGNOSIS — N289 Disorder of kidney and ureter, unspecified: Secondary | ICD-10-CM | POA: Diagnosis not present

## 2022-06-29 DIAGNOSIS — K769 Liver disease, unspecified: Secondary | ICD-10-CM | POA: Diagnosis not present

## 2022-06-29 DIAGNOSIS — E278 Other specified disorders of adrenal gland: Secondary | ICD-10-CM | POA: Insufficient documentation

## 2022-06-29 DIAGNOSIS — E279 Disorder of adrenal gland, unspecified: Secondary | ICD-10-CM | POA: Diagnosis not present

## 2022-06-29 DIAGNOSIS — N2889 Other specified disorders of kidney and ureter: Secondary | ICD-10-CM | POA: Diagnosis not present

## 2022-06-29 MED ORDER — GADOBUTROL 1 MMOL/ML IV SOLN
7.0000 mL | Freq: Once | INTRAVENOUS | Status: AC | PRN
  Administered 2022-06-29: 7 mL via INTRAVENOUS
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
## 2022-09-03 ENCOUNTER — Telehealth: Payer: Self-pay | Admitting: Family Medicine

## 2022-09-03 NOTE — Telephone Encounter (Signed)
Called patient to schedule Medicare Annual Wellness Visit (AWV). Left message for patient to call back and schedule Medicare Annual Wellness Visit (AWV).  Last date of AWV: 08/03/2017   Please schedule an AWVS appointment at any time with [place].  If any questions, please contact me at [phone].    Thank you,  [place] Direct dial  [phone]

## 2022-09-04 ENCOUNTER — Other Ambulatory Visit: Payer: Self-pay

## 2022-09-04 ENCOUNTER — Telehealth: Payer: Self-pay | Admitting: Family Medicine

## 2022-09-04 MED ORDER — ATORVASTATIN CALCIUM 40 MG PO TABS: 40.0000 mg | ORAL_TABLET | Freq: Every day | ORAL | 3 refills | Status: DC

## 2022-09-04 MED ORDER — TRIAMCINOLONE ACETONIDE 0.1 % EX OINT: 1.0000 | TOPICAL_OINTMENT | Freq: Two times a day (BID) | CUTANEOUS | 1 refills | Status: DC

## 2022-09-04 MED ORDER — TAMSULOSIN HCL 0.4 MG PO CAPS: 0.4000 mg | ORAL_CAPSULE | Freq: Every evening | ORAL | 3 refills | Status: DC

## 2022-09-04 NOTE — Telephone Encounter (Signed)
Patient calls nurse line reporting pharmacy change.   Patient reports he is using [facility] for his pharmacy needs.   This has been updated in chart.   Will forward to PCP to send in meds.

## 2022-09-04 NOTE — Telephone Encounter (Signed)
Called patient to schedule Medicare Annual Wellness Visit (AWV). Left message for patient to call back and schedule Medicare Annual Wellness Visit (AWV).  Last date of AWV: 08/03/2017   Please schedule an AWVS appointment at any time with [place].  If any questions, please contact me at [phone].    Thank you,  [place] Direct dial  [phone]

## 2022-09-04 NOTE — Telephone Encounter (Signed)
Contacted Edward Newton to schedule their annual wellness visit. Appointment made for 09/14/2022.  Thank you,  [place] Direct dial  [phone]

## 2022-09-10 NOTE — Patient Instructions (Signed)
Health Maintenance, Male Adopting a healthy lifestyle and getting preventive care are important in promoting health and wellness. Ask your health care provider about: The right schedule for you to have regular tests and exams. Things you can do on your own to prevent diseases and keep yourself healthy. What should I know about diet, weight, and exercise? Eat a healthy diet  Eat a diet that includes plenty of vegetables, fruits, low-fat dairy products, and lean protein. Do not eat a lot of foods that are high in solid fats, added sugars, or sodium. Maintain a healthy weight Body mass index (BMI) is a measurement that can be used to identify possible weight problems. It estimates body fat based on height and weight. Your health care provider can help determine your BMI and help you achieve or maintain a healthy weight. Get regular exercise Get regular exercise. This is one of the most important things you can do for your health. Most adults should: Exercise for at least 150 minutes each week. The exercise should increase your heart rate and make you sweat (moderate-intensity exercise). Do strengthening exercises at least twice a week. This is in addition to the moderate-intensity exercise. Spend less time sitting. Even light physical activity can be beneficial. Watch cholesterol and blood lipids Have your blood tested for lipids and cholesterol at 80 years of age, then have this test every 5 years. You may need to have your cholesterol levels checked more often if: Your lipid or cholesterol levels are high. You are older than 80 years of age. You are at high risk for heart disease. What should I know about cancer screening? Many types of cancers can be detected early and may often be prevented. Depending on your health history and family history, you may need to have cancer screening at various ages. This may include screening for: Colorectal cancer. Prostate cancer. Skin cancer. Lung  cancer. What should I know about heart disease, diabetes, and high blood pressure? Blood pressure and heart disease High blood pressure causes heart disease and increases the risk of stroke. This is more likely to develop in people who have high blood pressure readings or are overweight. Talk with your health care provider about your target blood pressure readings. Have your blood pressure checked: Every 3-5 years if you are 18-39 years of age. Every year if you are 40 years old or older. If you are between the ages of 65 and 75 and are a current or former smoker, ask your health care provider if you should have a one-time screening for abdominal aortic aneurysm (AAA). Diabetes Have regular diabetes screenings. This checks your fasting blood sugar level. Have the screening done: Once every three years after age 45 if you are at a normal weight and have a low risk for diabetes. More often and at a younger age if you are overweight or have a high risk for diabetes. What should I know about preventing infection? Hepatitis B If you have a higher risk for hepatitis B, you should be screened for this virus. Talk with your health care provider to find out if you are at risk for hepatitis B infection. Hepatitis C Blood testing is recommended for: Everyone born from 1945 through 1965. Anyone with known risk factors for hepatitis C. Sexually transmitted infections (STIs) You should be screened each year for STIs, including gonorrhea and chlamydia, if: You are sexually active and are younger than 80 years of age. You are older than 80 years of age and your   health care provider tells you that you are at risk for this type of infection. Your sexual activity has changed since you were last screened, and you are at increased risk for chlamydia or gonorrhea. Ask your health care provider if you are at risk. Ask your health care provider about whether you are at high risk for HIV. Your health care provider  may recommend a prescription medicine to help prevent HIV infection. If you choose to take medicine to prevent HIV, you should first get tested for HIV. You should then be tested every 3 months for as long as you are taking the medicine. Follow these instructions at home: Alcohol use Do not drink alcohol if your health care provider tells you not to drink. If you drink alcohol: Limit how much you have to 0-2 drinks a day. Know how much alcohol is in your drink. In the [place] one drink equals one 12 oz bottle of beer (355 mL), one 5 oz glass of wine (148 mL), or one 1 oz glass of hard liquor (44 mL). Lifestyle Do not use any products that contain nicotine or tobacco. These products include cigarettes, chewing tobacco, and vaping devices, such as e-cigarettes. If you need help quitting, ask your health care provider. Do not use street drugs. Do not share needles. Ask your health care provider for help if you need support or information about quitting drugs. General instructions Schedule regular health, dental, and eye exams. Stay current with your vaccines. Tell your health care provider if: You often feel depressed. You have ever been abused or do not feel safe at home. Summary Adopting a healthy lifestyle and getting preventive care are important in promoting health and wellness. Follow your health care provider's instructions about healthy diet, exercising, and getting tested or screened for diseases. Follow your health care provider's instructions on monitoring your cholesterol and blood pressure. This information is not intended to replace advice given to you by your health care provider. Make sure you discuss any questions you have with your health care provider. Document Revised: 10/21/2020 Document Reviewed: 10/21/2020 Elsevier Patient Education  2023 [employer]  
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
## 2022-09-14 ENCOUNTER — Ambulatory Visit (INDEPENDENT_AMBULATORY_CARE_PROVIDER_SITE_OTHER): Payer: Medicare HMO

## 2022-09-14 DIAGNOSIS — Z0000 Encounter for general adult medical examination without abnormal findings: Secondary | ICD-10-CM

## 2022-09-28 ENCOUNTER — Other Ambulatory Visit: Payer: Self-pay

## 2022-09-28 DIAGNOSIS — I159 Secondary hypertension, unspecified: Secondary | ICD-10-CM

## 2022-09-28 MED ORDER — HYDROCHLOROTHIAZIDE 12.5 MG PO TABS: 12.5000 mg | ORAL_TABLET | Freq: Every day | ORAL | 1 refills | Status: DC

## 2022-09-28 NOTE — Telephone Encounter (Signed)
Covering for Dr. Clayborne Artist.  Received request for refill for HCTZ.  Patient chart indicates last BMP December, he is now due for recheck of creatinine.  Will refill HCTZ.  Placed order for future BMP.  Fayette Pho, MD

## 2022-11-30 ENCOUNTER — Ambulatory Visit (HOSPITAL_COMMUNITY)
Admission: RE | Admit: 2022-11-30 | Discharge: 2022-11-30 | Disposition: A | Discharge status: Home / Self Care | Payer: Medicare HMO | Source: Ambulatory Visit | Attending: Family Medicine | Admitting: Family Medicine

## 2022-11-30 ENCOUNTER — Encounter: Payer: Self-pay | Admitting: Family Medicine

## 2022-11-30 ENCOUNTER — Ambulatory Visit (INDEPENDENT_AMBULATORY_CARE_PROVIDER_SITE_OTHER): Payer: Medicare HMO | Admitting: Family Medicine

## 2022-11-30 VITALS — BP 124/71 | HR 88 | Temp 98.1°F | Ht 70.0 in | Wt 158.4 lb

## 2022-11-30 DIAGNOSIS — R052 Subacute cough: Secondary | ICD-10-CM | POA: Insufficient documentation

## 2022-11-30 MED ORDER — AZITHROMYCIN 250 MG PO TABS: ORAL_TABLET | ORAL | 0 refills | Status: DC

## 2022-11-30 NOTE — Progress Notes (Unsigned)
    SUBJECTIVE:   CHIEF COMPLAINT / HPI:   States he has been coughing since May 23rd. Initially thought he had the flu  Has been taking OTC medications. Has tried honey, warm water. Denies fever, chills. Denies difficulty breathing, chest pain.   PERTINENT  PMH / PSH: ***  OBJECTIVE:   BP 124/71   Pulse 88   Temp 98.1 F (36.7 C) (Oral)   Ht 5\' 10"  (1.778 m)   Wt 158 lb 6 oz (71.8 kg)   SpO2 97%   BMI 22.72 kg/m    Physical exam General: well appearing, NAD Cardiovascular: RRR, no murmurs Lungs: CTAB. Normal WOB Abdomen: soft, non-distended, non-tender Skin: warm, dry. No edema  ASSESSMENT/PLAN:   No problem-specific Assessment & Plan notes found for this encounter.     Cora Collum, DO [facility]

## 2022-11-30 NOTE — Patient Instructions (Signed)
It was great seeing you today!  Im sorry you have not been feeling well. I am going to start you on an antibiotic azithromycin 500 mg orally as a single dose today, then 250 mg/day on days 2 to 5.  You can also walk across the street to the front entrance of the hospital to get a chest x ray. I will call you if anything is abnormal!   Return if symptoms worsen, you have difficulty breathing, fevers, or new and concerning symptoms.   Feel free to call with any questions or concerns at any time, at [phone].   Take care,  Dr. Cora Collum [facility]

## 2022-12-01 NOTE — Assessment & Plan Note (Signed)
Patient presents with about 3 weeks of a productive cough without other symptoms. Vitals reassuring and patient remains afebrile. Exam significant for diffuse crackles but normal WOB and O2 sat of 97%. Given prolonged symptoms will treat with antibiotics, check CXR, and continue symptomatic care. Return precautions discussed if symptoms were to worsen, he has difficulty breathing, becomes febrile.  - Azithromycin x 5 days  - CXR

## 2022-12-07 ENCOUNTER — Telehealth: Payer: Self-pay

## 2022-12-07 NOTE — Telephone Encounter (Signed)
Patient calls nurse line requesting a cough syrup.   He reports he has finished the antibiotics given on 6/17, however denies any improvement. He denies any worsening symptoms.   Patient advised he may need to come back in for cough syrup.   However, will forward to provider who saw patient.

## 2022-12-08 ENCOUNTER — Other Ambulatory Visit: Payer: Self-pay

## 2022-12-08 ENCOUNTER — Other Ambulatory Visit: Payer: Self-pay | Admitting: Family Medicine

## 2022-12-08 DIAGNOSIS — I159 Secondary hypertension, unspecified: Secondary | ICD-10-CM

## 2022-12-08 MED ORDER — BENZONATATE 200 MG PO CAPS: 200.0000 mg | ORAL_CAPSULE | Freq: Two times a day (BID) | ORAL | 0 refills | Status: DC | PRN

## 2022-12-08 MED ORDER — HYDROCHLOROTHIAZIDE 12.5 MG PO TABS: 12.5000 mg | ORAL_TABLET | Freq: Every day | ORAL | 1 refills | Status: DC

## 2022-12-09 NOTE — Telephone Encounter (Signed)
VM left asking patient to return my call.

## 2022-12-09 NOTE — Telephone Encounter (Signed)
Patient returns my call.   Patient advised of Tessalon to his pharmacy.  Patient appreciative.

## 2022-12-18 ENCOUNTER — Ambulatory Visit (INDEPENDENT_AMBULATORY_CARE_PROVIDER_SITE_OTHER): Payer: Medicare HMO | Admitting: Student

## 2022-12-18 VITALS — BP 130/80 | HR 74 | Ht 70.0 in | Wt 157.0 lb

## 2022-12-18 DIAGNOSIS — R319 Hematuria, unspecified: Secondary | ICD-10-CM

## 2022-12-18 DIAGNOSIS — R052 Subacute cough: Secondary | ICD-10-CM

## 2022-12-18 HISTORY — DX: Hematuria, unspecified: R31.9

## 2022-12-18 LAB — POCT URINALYSIS DIP (MANUAL ENTRY)
Bilirubin, UA: NEGATIVE
Glucose, UA: NEGATIVE mg/dL
Ketones, POC UA: NEGATIVE mg/dL
Leukocytes, UA: NEGATIVE
Nitrite, UA: NEGATIVE
Protein Ur, POC: NEGATIVE mg/dL
Spec Grav, UA: 1.02 (ref 1.010–1.025)
Urobilinogen, UA: 0.2 E.U./dL
pH, UA: 5.5 (ref 5.0–8.0)

## 2022-12-18 LAB — POCT UA - MICROSCOPIC ONLY
Bacteria, U Microscopic: NONE SEEN
WBC, Ur, HPF, POC: NONE SEEN (ref 0–5)

## 2022-12-18 NOTE — Assessment & Plan Note (Addendum)
Gross hematuria x 1 week with small blood on UA and 5-10 RBCs on microscopic.  No accompanying dysuria, pain to suggest kidney related condition.  This is a concerning history with accompanying blood work in an 82 year old male.  He does have a history of smoking but quit 30 to 40 years ago.  Will check BMP for kidney function and proceed with abdominal pelvic CT without and with contrast and urgent urology referral.

## 2022-12-18 NOTE — Assessment & Plan Note (Signed)
Still present and productive.  Does not appear in respiratory distress.  Recommend follow-up for this given hematuria is more concerning matter at this time.

## 2022-12-18 NOTE — Progress Notes (Signed)
  SUBJECTIVE:   CHIEF COMPLAINT / HPI:   Hematuria: noticed it first on Monday, was also present this morning. He is unsure if it has been every time he urinates. Notes his urine was really dark but then returned to normal color. Denies pain anywhere, dysuria, diarrhea/constipation, fever, SOB. He was wondering if it was from the tessalon perls he was prescribed so he stopped taking. He endorses phlegmatic cough still that is persisting. He stays hydrated. He does work outside in the morning trimming hedges.   PERTINENT  PMH / PSH: HQIO([id number]), bowel resection (2023)  Patient Care Team: Evette Georges, MD as PCP - General (Family Medicine) OBJECTIVE:  BP 130/80   Pulse 74   Ht 5\' 10"  (1.778 m)   Wt 157 lb (71.2 kg)   BMI 22.53 kg/m  Physical Exam Constitutional:      General: He is not in acute distress.    Appearance: Normal appearance. He is not toxic-appearing.  Cardiovascular:     Rate and Rhythm: Normal rate and regular rhythm.     Heart sounds: Normal heart sounds.  Pulmonary:     Effort: No respiratory distress.  Abdominal:     General: Abdomen is flat. Bowel sounds are normal.     Palpations: Abdomen is soft.  Neurological:     Mental Status: He is alert.    ASSESSMENT/PLAN:  Hematuria, unspecified type Assessment & Plan: Gross hematuria x 1 week with small blood on UA and 5-10 RBCs on microscopic.  No accompanying dysuria, pain to suggest kidney related condition.  This is a concerning history with accompanying blood work in an 82 year old male.  He does have a history of smoking but quit 30 to 40 years ago.  Will check BMP for kidney function and proceed with abdominal pelvic CT without and with contrast and urgent urology referral.  Orders: -     POCT urinalysis dipstick -     POCT UA - Microscopic Only -     Basic metabolic panel -     Ambulatory referral to Urology -     CT ABDOMEN PELVIS W CONTRAST; Future -     CT ABDOMEN PELVIS WO CONTRAST; Future  Subacute  cough Assessment & Plan: Still present and productive.  Does not appear in respiratory distress.  Recommend follow-up for this given hematuria is more concerning matter at this time.   Shelby Mattocks, DO 12/18/2022, 12:29 PM PGY-3, [facility]

## 2022-12-18 NOTE — Patient Instructions (Signed)
It was great to see you today! Thank you for choosing [facility] for your primary care. Edward Newton was seen for hematuria.  Today we addressed: The blood in your urine concerns me and both of our exams show that this is truly blood.  I am getting lab work to check your kidney function.  We do need to get some imaging for this and I have submitted an urgent referral to urology.  I will be in touch with you based on the results and so we can get imaging on you soon.  If you haven't already, sign up for My Chart to have easy access to your labs results, and communication with your primary care physician.  Please arrive 15 minutes before your appointment to ensure smooth check in process.  We appreciate your efforts in making this happen.  Thank you for allowing me to participate in your care, Edward Mattocks, DO 12/18/2022, 11:21 AM PGY-3, [facility]

## 2022-12-19 LAB — BASIC METABOLIC PANEL
BUN/Creatinine Ratio: 13 (ref 10–24)
BUN: 15 mg/dL (ref 8–27)
CO2: 26 mmol/L (ref 20–29)
Calcium: 9.3 mg/dL (ref 8.6–10.2)
Chloride: 105 mmol/L (ref 96–106)
Creatinine, Ser: 1.14 mg/dL (ref 0.76–1.27)
Glucose: 107 mg/dL — ABNORMAL HIGH (ref 70–99)
Potassium: 4.1 mmol/L (ref 3.5–5.2)
Sodium: 143 mmol/L (ref 134–144)
eGFR: 65 mL/min/{1.73_m2} (ref >59)

## 2022-12-22 ENCOUNTER — Encounter: Payer: Self-pay | Admitting: Family Medicine

## 2023-01-11 ENCOUNTER — Ambulatory Visit
Admission: RE | Admit: 2023-01-11 | Discharge: 2023-01-11 | Disposition: A | Discharge status: Home / Self Care | Payer: Medicare HMO | Source: Ambulatory Visit | Attending: Family Medicine | Admitting: Family Medicine

## 2023-01-11 DIAGNOSIS — R319 Hematuria, unspecified: Secondary | ICD-10-CM

## 2023-01-22 ENCOUNTER — Telehealth: Payer: Self-pay | Admitting: Student

## 2023-01-22 DIAGNOSIS — R310 Gross hematuria: Secondary | ICD-10-CM

## 2023-01-22 NOTE — Telephone Encounter (Signed)
Attempted to call related to recent CT scan for evaluation of gross hematuria.  Unable to reach patient, left voice message as phone and stated it was his phone.  Clarified I was attempting to reach him regarding CT scan and if he would like me to leave a message in the future with further information or reach out via MyChart message I be happy to go further in depth but ultimately I am going to refer him to urology for further evaluation regarding his hematuria.

## 2023-02-24 LAB — HM DIABETES EYE EXAM

## 2023-03-18 ENCOUNTER — Other Ambulatory Visit: Payer: Self-pay | Admitting: Family Medicine

## 2023-03-18 DIAGNOSIS — I159 Secondary hypertension, unspecified: Secondary | ICD-10-CM

## 2023-07-06 ENCOUNTER — Ambulatory Visit: Payer: Medicare HMO | Admitting: Family Medicine

## 2023-07-07 ENCOUNTER — Ambulatory Visit: Payer: Medicare Other | Admitting: Family Medicine

## 2023-07-07 VITALS — BP 135/85 | HR 86 | Temp 97.8°F | Ht 70.0 in | Wt 175.2 lb

## 2023-07-07 DIAGNOSIS — R109 Unspecified abdominal pain: Secondary | ICD-10-CM

## 2023-07-07 NOTE — Patient Instructions (Signed)
Good to see you today - Thank you for coming in  Things we discussed today:  1) Call your surgeon to see if they can move your appointment sooner so that they can evaluate your hernia.  2) I am also checking a few labs to see if there are other causes for your abdominal pain.  3) please seek emergency medical attention if you experience any of the following: -Uncontrolled vomiting -Your hernia is growing in size, and you are unable to push it back in -You are unable to pass any stool or gas -You are unable to eat anything

## 2023-07-07 NOTE — Progress Notes (Signed)
    SUBJECTIVE:   CHIEF COMPLAINT / HPI:   CM is a 83 year old male with history of small bowel resection for SBO in 2023 and ventral hernia that presents for abdominal pain. - Started to have abm over a month ago.  - Has hx of hernia - Still eating well, no nausea or vomiting - Denies diarrhea, blood in stool - Pain occurs most of the time. - Reports the pain is exacerbated when he has to move from laying to sitting up position. Feels like it makes his stomach bulge out.    - Per 8/7 General Surgery note by Dr. Janee Morn, they plan to defer surgical intervention of the hernia at that time.  OBJECTIVE:   BP 135/85   Pulse 86   Temp 97.8 F (36.6 C)   Ht 5\' 10"  (1.778 m)   Wt 175 lb 3.2 oz (79.5 kg)   SpO2 96%   BMI 25.14 kg/m   General: Alert, pleasant nontoxic appearing man. NAD. HEENT: NCAT. MMM. CV: RRR, no murmurs. Resp: CTAB, no wheezing or crackles. Normal WOB on RA.  YNW:GNFAOZ to palpation in epigastric region, over where hernia is. Large midline ventral hernia that is reducible.  The hernia bulges out when he moves from laying to sitting position. BS present. Ext: Moves all ext spontaneously Skin: Warm, well perfused    ASSESSMENT/PLAN:   Assessment & Plan Abdominal pain, unspecified abdominal location Differential includes discomfort from hernia, pancreatitis, gallstones, gastric ulcers.  Hernia is currently still reducible, tolerating p.o. well, weight stable, normal bowel movements, vital stable.  Had CT scan in August 2024 which did not show incarceration or strangulation of hernia at that time.  Has been following [facility] surgery for this hernia.   -Has follow-up with surgeon in February, advised patient to call and see if he can get this appointment moved up.  They may need to rediscuss surgical intervention for the hernia. -Will check CMP and lipase to evaluate for biliary and pancreatic etiologies    Lincoln Brigham, MD [facility]

## 2023-07-08 ENCOUNTER — Encounter: Payer: Self-pay | Admitting: Family Medicine

## 2023-07-08 LAB — COMPREHENSIVE METABOLIC PANEL
ALT: 15 [IU]/L (ref 0–44)
AST: 15 [IU]/L (ref 0–40)
Albumin: 4.2 g/dL (ref 3.7–4.7)
Alkaline Phosphatase: 93 [IU]/L (ref 44–121)
BUN/Creatinine Ratio: 11 (ref 10–24)
BUN: 13 mg/dL (ref 8–27)
Bilirubin Total: 0.7 mg/dL (ref 0.0–1.2)
CO2: 26 mmol/L (ref 20–29)
Calcium: 9.2 mg/dL (ref 8.6–10.2)
Chloride: 103 mmol/L (ref 96–106)
Creatinine, Ser: 1.21 mg/dL (ref 0.76–1.27)
Globulin, Total: 2.6 g/dL (ref 1.5–4.5)
Glucose: 128 mg/dL — ABNORMAL HIGH (ref 70–99)
Potassium: 4.2 mmol/L (ref 3.5–5.2)
Sodium: 143 mmol/L (ref 134–144)
Total Protein: 6.8 g/dL (ref 6.0–8.5)
eGFR: 60 mL/min/{1.73_m2} (ref >59)

## 2023-07-08 LAB — LIPASE: Lipase: 16 U/L (ref 13–78)

## 2023-07-29 ENCOUNTER — Ambulatory Visit: Payer: Self-pay | Admitting: Surgery

## 2023-08-04 ENCOUNTER — Other Ambulatory Visit: Payer: Self-pay | Admitting: Family Medicine

## 2023-08-04 DIAGNOSIS — I159 Secondary hypertension, unspecified: Secondary | ICD-10-CM

## 2023-08-06 ENCOUNTER — Encounter: Payer: Self-pay | Admitting: Student

## 2023-08-06 ENCOUNTER — Ambulatory Visit: Payer: Medicare Other | Admitting: Student

## 2023-08-06 ENCOUNTER — Other Ambulatory Visit: Payer: Self-pay

## 2023-08-06 VITALS — BP 139/88 | HR 82 | Ht 70.0 in | Wt 176.2 lb

## 2023-08-06 DIAGNOSIS — H6992 Unspecified Eustachian tube disorder, left ear: Secondary | ICD-10-CM

## 2023-08-06 MED ORDER — FLUTICASONE PROPIONATE 50 MCG/ACT NA SUSP: 2.0000 | Freq: Every day | NASAL | 6 refills | Status: DC

## 2023-08-06 MED ORDER — ATORVASTATIN CALCIUM 40 MG PO TABS: 40.0000 mg | ORAL_TABLET | Freq: Every day | ORAL | 3 refills | Status: DC

## 2023-08-06 NOTE — Progress Notes (Signed)
    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  Recently saw audiology on 01/14/2023-it was recommended for repeat hearing test, otologic care, adequate hearing protection, and consideration of amplification trial.  Was also diagnosed with eustachian tube dysfunction at that time, patient reports he received a nose spray that did not help-although questionable adherence. Remote history of parotid cancer (2004).  Had excision of parotid gland on the left, chemo and radiation to left side of face.  Since that time, he has had trouble with hearing loss worse on the left than right. His primary concern is that he is not having numbness around his mastoid process and a rattling sensation in his left ear.  Intermittent ear pain.  Reports using Q-tips daily, and we discussed that this is not ideal.  OBJECTIVE:   BP (!) 167/84   Pulse 82   Ht 5\' 10"  (1.778 m)   Wt 176 lb 4 oz (79.9 kg)   SpO2 96%   BMI 25.29 kg/m    General: NAD, pleasant HEENT: Normocephalic, atraumatic head. Normal external ear, canal, TM bilaterally. EOM intact and normal conjunctiva BL. Normal external nose. Throat not erythematous, no exudate, no deviation. Normal dentition.  Cardio: RRR, no MRG. Cap Refill <2s. Respiratory: CTAB, normal wob on RA Skin: Warm and dry  ASSESSMENT/PLAN:   Assessment & Plan Eustachian tube dysfunction, left Suspect some component of eustachian tube dysfunction.  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  With odd sensation of rattling in ear/numbness of mastoid process-I question if recurrence of malignancy needs to be considered. - Trial Flonase for eustachian tube dysfunction - ENT referral for further evaluation of sensorineural hearing loss of left and potential MRI to rule out malignancy   Tiffany Kocher, DO [facility]

## 2023-08-06 NOTE — Patient Instructions (Signed)
 It was great to see you! Thank you for allowing me to participate in your care!   I recommend that you always bring your medications to each appointment as this makes it easy to ensure we are on the correct medications and helps [place] not miss when refills are needed.  Our plans for today:  -Please use 2 sprays of Flonase in each nostril twice a day for 7-14 days -I have sent a referral to ENT, you will receive a call in 2-3 weeks to schedule appointment   Take care and seek immediate care sooner if you develop any concerns. Please remember to show up 15 minutes before your scheduled appointment time!  Tiffany Kocher, DO [facility]

## 2023-08-10 NOTE — Telephone Encounter (Signed)
 Sent patient letter explaining that he needs an appointment for follow up of chronic conditions, specifically his HTN. I received a refill request for hydrochlorothiazide that appears to have been discontinued, but there are no records since then of his BP control. Unfortunately, we have not been able to get in touch with him by phone, and MyChart has not been active since 06/2022.

## 2023-08-13 ENCOUNTER — Other Ambulatory Visit: Payer: Self-pay

## 2023-08-13 DIAGNOSIS — I159 Secondary hypertension, unspecified: Secondary | ICD-10-CM

## 2023-08-13 MED ORDER — HYDROCHLOROTHIAZIDE 12.5 MG PO TABS
12.5000 mg | ORAL_TABLET | Freq: Every day | ORAL | 1 refills | Status: DC
Start: 2023-08-13 — End: 2024-02-03

## 2023-08-16 NOTE — Progress Notes (Signed)
 COVID Vaccine received:  []  No [x]  Yes Date of any COVID positive Test in last 90 days: no PCP - Evette Georges MD Cardiologist - n/a  Chest x-ray - 11/30/22 Epic EKG -   Stress Test -  ECHO -  Cardiac Cath -   Bowel Prep - [x]  No  []   Yes ______  Pacemaker / ICD device [x]  No []  Yes   Spinal Cord Stimulator:[x]  No []  Yes       History of Sleep Apnea? [x]  No []  Yes   CPAP used?- [x]  No []  Yes    Does the patient monitor blood sugar?          [x]  No []  Yes  []  N/A  Patient has: []  NO Hx DM   []  Pre-DM                 []  DM1  [x]   DM2 Does patient have a [employer] or Dexacom? [x]  No []  Yes   Fasting Blood Sugar Ranges-  Checks Blood Sugar ___0__ times a day  GLP1 agonist / usual dose - no GLP1 instructions:  SGLT-2 inhibitors / usual dose - no SGLT-2 instructions:   Blood Thinner / Instructions:no Aspirin Instructions:no  Comments:   Activity level: Patient is able to climb a flight of stairs without difficulty; [x]  No CP  [x]  No SOB,    Patient can perform ADLs without assistance.   Anesthesia review: A1c 8.2 on 08/17/23, Abnl EKG.  Patient denies shortness of breath, fever, cough and chest pain at PAT appointment.  Patient verbalized understanding and agreement to the Pre-Surgical Instructions that were given to them at this PAT appointment. Patient was also educated of the need to review these PAT instructions again prior to his/her surgery.I reviewed the appropriate phone numbers to call if they have any and questions or concerns.

## 2023-08-16 NOTE — Patient Instructions (Signed)
 SURGICAL WAITING ROOM VISITATION  Patients having surgery or a procedure may have no more than 2 support people in the waiting area - these visitors may rotate.    Children under the age of 67 must have an adult with them who is not the patient.  Due to an increase in RSV and influenza rates and associated hospitalizations, children ages 10 and under may not visit patients in  [facility] hospitals.  Visitors with respiratory illnesses are discouraged from visiting and should remain at home.  If the patient needs to stay at the hospital during part of their recovery, the visitor guidelines for inpatient rooms apply. Pre-op nurse will coordinate an appropriate time for 1 support person to accompany patient in pre-op.  This support person may not rotate.    Please refer to the [facility] website for the visitor guidelines for Inpatients (after your surgery is over and you are in a regular room).       Your procedure is scheduled on: 08/23/23   Report to [facility] Main Entrance    Report to admitting at 5:15 AM   Call this number if you have problems the morning of surgery [phone]   Do not eat food :After Midnight.   After Midnight you may have the following liquids until 4:30 AM DAY OF SURGERY  Water Non-Citrus Juices (without pulp, NO RED-Apple, White grape, White cranberry) Black Coffee (NO MILK/CREAM OR CREAMERS, sugar ok)  Clear Tea (NO MILK/CREAM OR CREAMERS, sugar ok) regular and decaf                             Plain Jell-O (NO RED)                                           Fruit ices (not with fruit pulp, NO RED)                                     Popsicles (NO RED)                                                               Sports drinks like Gatorade (NO RED)                 FOLLOW BOWEL PREP AND ANY ADDITIONAL PRE OP INSTRUCTIONS YOU RECEIVED FROM YOUR SURGEON'S OFFICE!!!     Oral Hygiene is also important to reduce your risk of infection.                                     Remember - BRUSH YOUR TEETH THE MORNING OF SURGERY WITH YOUR REGULAR TOOTHPASTE  DENTURES WILL BE REMOVED PRIOR TO SURGERY PLEASE DO NOT APPLY "Poly grip" OR ADHESIVES!!!   Stop all vitamins and herbal supplements 7 days before surgery.   Take these medicines the morning of surgery with A SIP OF WATER: Atorvastatin, Tamsulosin(flomax), nasal spray if needed.  Do not take the Hydrochlorathiazide(Hydrodiuril) the morning of surgery.  DO NOT TAKE ANY ORAL DIABETIC MEDICATIONS DAY OF YOUR SURGERY             You may not have any metal on your body including hair pins, jewelry, and body piercing             Do not wear make-up, lotions, powders, perfumes/cologne, or deodorant              Men may shave face and neck.   Do not bring valuables to the hospital. [facility] IS NOT             RESPONSIBLE   FOR VALUABLES.   Contacts, glasses, dentures or bridgework may not be worn into surgery.   Bring small overnight bag day of surgery.   DO NOT BRING YOUR HOME MEDICATIONS TO THE HOSPITAL. PHARMACY WILL DISPENSE MEDICATIONS LISTED ON YOUR MEDICATION LIST TO YOU DURING YOUR ADMISSION IN THE HOSPITAL!    Patients discharged on the day of surgery will not be allowed to drive home.  Someone NEEDS to stay with you for the first 24 hours after anesthesia.   Special Instructions: Bring a copy of your healthcare power of attorney and living will documents the day of surgery if you haven't scanned them before.              Please read over the following fact sheets you were given: IF YOU HAVE QUESTIONS ABOUT YOUR PRE-OP INSTRUCTIONS PLEASE CALL [phone] Rosey Bath   If you received a COVID test during your pre-op visit  it is requested that you wear a mask when out in public, stay away from anyone that may not be feeling well and notify your surgeon if you develop symptoms. If you test positive for Covid or have been in contact with anyone that has tested  positive in the last 10 days please notify you surgeon.    [place] - Preparing for Surgery Before surgery, you can play an important role.  Because skin is not sterile, your skin needs to be as free of germs as possible.  You can reduce the number of germs on your skin by washing with CHG (chlorahexidine gluconate) soap before surgery.  CHG is an antiseptic cleaner which kills germs and bonds with the skin to continue killing germs even after washing. Please DO NOT use if you have an allergy to CHG or antibacterial soaps.  If your skin becomes reddened/irritated stop using the CHG and inform your nurse when you arrive at Short Stay. Do not shave (including legs and underarms) for at least 48 hours prior to the first CHG shower.  You may shave your face/neck.  Please follow these instructions carefully:  1.  Shower with CHG Soap the night before surgery and the  morning of surgery.  2.  If you choose to wash your hair, wash your hair first as usual with your normal  shampoo.  3.  After you shampoo, rinse your hair and body thoroughly to remove the shampoo.                             4.  Use CHG as you would any other liquid soap.  You can apply chg directly to the skin and wash.  Gently with a scrungie or clean washcloth.  5.  Apply the CHG Soap to your body ONLY FROM THE NECK DOWN.  Do   not use on face/ open                           Wound or open sores. Avoid contact with eyes, ears mouth and   genitals (private parts).                       Wash face,  Genitals (private parts) with your normal soap.             6.  Wash thoroughly, paying special attention to the area where your    surgery  will be performed.  7.  Thoroughly rinse your body with warm water from the neck down.  8.  DO NOT shower/wash with your normal soap after using and rinsing off the CHG Soap.                9.  Pat yourself dry with a clean towel.            10.  Wear clean pajamas.            11.  Place clean sheets  on your bed the night of your first shower and do not  sleep with pets. Day of Surgery : Do not apply any lotions/deodorants the morning of surgery.  Please wear clean clothes to the hospital/surgery center.  FAILURE TO FOLLOW THESE INSTRUCTIONS MAY RESULT IN THE CANCELLATION OF YOUR SURGERY  PATIENT SIGNATURE_________________________________  NURSE SIGNATURE__________________________________  ________________________________________________________________________

## 2023-08-17 ENCOUNTER — Encounter (HOSPITAL_COMMUNITY): Payer: Self-pay

## 2023-08-17 ENCOUNTER — Encounter (HOSPITAL_COMMUNITY)
Admission: RE | Admit: 2023-08-17 | Discharge: 2023-08-17 | Disposition: A | Discharge status: Home / Self Care | Payer: Medicare Other | Source: Ambulatory Visit | Attending: Surgery | Admitting: Surgery

## 2023-08-17 ENCOUNTER — Other Ambulatory Visit: Payer: Self-pay

## 2023-08-17 VITALS — BP 142/82 | HR 78 | Temp 98.1°F | Resp 18 | Ht 70.0 in | Wt 171.0 lb

## 2023-08-17 DIAGNOSIS — Z87891 Personal history of nicotine dependence: Secondary | ICD-10-CM | POA: Diagnosis not present

## 2023-08-17 DIAGNOSIS — E119 Type 2 diabetes mellitus without complications: Secondary | ICD-10-CM | POA: Insufficient documentation

## 2023-08-17 DIAGNOSIS — Z01818 Encounter for other preprocedural examination: Secondary | ICD-10-CM | POA: Diagnosis not present

## 2023-08-17 DIAGNOSIS — K432 Incisional hernia without obstruction or gangrene: Secondary | ICD-10-CM | POA: Diagnosis not present

## 2023-08-17 DIAGNOSIS — I10 Essential (primary) hypertension: Secondary | ICD-10-CM | POA: Insufficient documentation

## 2023-08-17 LAB — CBC
HCT: 40.8 % (ref 39.0–52.0)
Hemoglobin: 13.2 g/dL (ref 13.0–17.0)
MCH: 28.9 pg (ref 26.0–34.0)
MCHC: 32.4 g/dL (ref 30.0–36.0)
MCV: 89.3 fL (ref 80.0–100.0)
Platelets: 176 10*3/uL (ref 150–400)
RBC: 4.57 MIL/uL (ref 4.22–5.81)
RDW: 14.3 % (ref 11.5–15.5)
WBC: 4.7 10*3/uL (ref 4.0–10.5)
nRBC: 0 % (ref 0.0–0.2)

## 2023-08-17 LAB — BASIC METABOLIC PANEL
Anion gap: 10 (ref 5–15)
BUN: 16 mg/dL (ref 8–23)
CO2: 26 mmol/L (ref 22–32)
Calcium: 9.2 mg/dL (ref 8.9–10.3)
Chloride: 104 mmol/L (ref 98–111)
Creatinine, Ser: 1.15 mg/dL (ref 0.61–1.24)
GFR, Estimated: >60 mL/min (ref >60)
Glucose, Bld: 180 mg/dL — ABNORMAL HIGH (ref 70–99)
Potassium: 3.7 mmol/L (ref 3.5–5.1)
Sodium: 140 mmol/L (ref 135–145)

## 2023-08-17 LAB — GLUCOSE, CAPILLARY: Glucose-Capillary: 169 mg/dL — ABNORMAL HIGH (ref 70–99)

## 2023-08-17 LAB — HEMOGLOBIN A1C
Hgb A1c MFr Bld: 8.2 % — ABNORMAL HIGH (ref 4.8–5.6)
Mean Plasma Glucose: 188.64 mg/dL

## 2023-08-17 NOTE — Progress Notes (Signed)
 Please review pt's preop HgbA1c from 08/17/23.

## 2023-08-18 ENCOUNTER — Encounter (HOSPITAL_COMMUNITY): Payer: Self-pay
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
## 2023-08-21 NOTE — Anesthesia Preprocedure Evaluation (Signed)
 Anesthesia Evaluation  Patient identified by MRN, date of birth, ID band Patient awake    Reviewed: Allergy & Precautions, H&P , NPO status , Patient's Chart, lab work & pertinent test results  Airway Mallampati: II  TM Distance: >3 FB Neck ROM: Full    Dental no notable dental hx. (+) Edentulous Upper, Partial Lower, Dental Advisory Given   Pulmonary neg pulmonary ROS, former smoker   Pulmonary exam normal breath sounds clear to auscultation       Cardiovascular Exercise Tolerance: Good hypertension, Pt. on medications  Rhythm:Regular Rate:Normal     Neuro/Psych negative neurological ROS  negative psych ROS   GI/Hepatic negative GI ROS, Neg liver ROS,,,  Endo/Other  negative endocrine ROSdiabetes, Well Controlled    Renal/GU negative Renal ROS  negative genitourinary   Musculoskeletal  (+) Arthritis , Osteoarthritis,    Abdominal   Peds  Hematology negative hematology ROS (+)   Anesthesia Other Findings   Reproductive/Obstetrics negative OB ROS                             Anesthesia Physical Anesthesia Plan  ASA: 2  Anesthesia Plan: General   Post-op Pain Management: Tylenol PO (pre-op)*   Induction: Intravenous  PONV Risk Score and Plan: 3 and Ondansetron and Dexamethasone  Airway Management Planned: Oral ETT  Additional Equipment:   Intra-op Plan:   Post-operative Plan: Extubation in OR  Informed Consent: I have reviewed the patients History and Physical, chart, labs and discussed the procedure including the risks, benefits and alternatives for the proposed anesthesia with the patient or authorized representative who has indicated his/her understanding and acceptance.     Dental advisory given  Plan Discussed with: CRNA  Anesthesia Plan Comments:        Anesthesia Quick Evaluation

## 2023-08-23 ENCOUNTER — Encounter (HOSPITAL_COMMUNITY)
Admission: RE | Disposition: A | Discharge status: Home / Self Care | Payer: Self-pay | Source: Home / Self Care | Attending: Surgery

## 2023-08-23 ENCOUNTER — Observation Stay (HOSPITAL_COMMUNITY)
Admission: RE | Admit: 2023-08-23 | Discharge: 2023-08-25 | Disposition: A | Discharge status: Home / Self Care | Payer: Medicare Other | Attending: Surgery | Admitting: Surgery

## 2023-08-23 ENCOUNTER — Encounter (HOSPITAL_COMMUNITY): Payer: Self-pay | Admitting: Surgery

## 2023-08-23 ENCOUNTER — Ambulatory Visit (HOSPITAL_BASED_OUTPATIENT_CLINIC_OR_DEPARTMENT_OTHER): Payer: Self-pay | Admitting: Anesthesiology

## 2023-08-23 ENCOUNTER — Ambulatory Visit (HOSPITAL_COMMUNITY): Payer: Self-pay | Admitting: Medical

## 2023-08-23 ENCOUNTER — Other Ambulatory Visit: Payer: Self-pay

## 2023-08-23 DIAGNOSIS — Z85818 Personal history of malignant neoplasm of other sites of lip, oral cavity, and pharynx: Secondary | ICD-10-CM | POA: Diagnosis not present

## 2023-08-23 DIAGNOSIS — Z79899 Other long term (current) drug therapy: Secondary | ICD-10-CM | POA: Insufficient documentation

## 2023-08-23 DIAGNOSIS — I10 Essential (primary) hypertension: Secondary | ICD-10-CM | POA: Insufficient documentation

## 2023-08-23 DIAGNOSIS — E119 Type 2 diabetes mellitus without complications: Secondary | ICD-10-CM | POA: Diagnosis not present

## 2023-08-23 DIAGNOSIS — E785 Hyperlipidemia, unspecified: Secondary | ICD-10-CM | POA: Diagnosis not present

## 2023-08-23 DIAGNOSIS — Z87891 Personal history of nicotine dependence: Secondary | ICD-10-CM | POA: Diagnosis not present

## 2023-08-23 DIAGNOSIS — K439 Ventral hernia without obstruction or gangrene: Principal | ICD-10-CM | POA: Diagnosis present

## 2023-08-23 DIAGNOSIS — R2681 Unsteadiness on feet: Secondary | ICD-10-CM | POA: Diagnosis not present

## 2023-08-23 DIAGNOSIS — K432 Incisional hernia without obstruction or gangrene: Secondary | ICD-10-CM | POA: Diagnosis not present

## 2023-08-23 DIAGNOSIS — K469 Unspecified abdominal hernia without obstruction or gangrene: Secondary | ICD-10-CM | POA: Diagnosis present

## 2023-08-23 HISTORY — PX: INCISIONAL HERNIA REPAIR: SHX193

## 2023-08-23 LAB — CREATININE, SERUM
Creatinine, Ser: 1.33 mg/dL — ABNORMAL HIGH (ref 0.61–1.24)
GFR, Estimated: 54 mL/min — ABNORMAL LOW (ref >60)

## 2023-08-23 LAB — CBC
HCT: 38.3 % — ABNORMAL LOW (ref 39.0–52.0)
Hemoglobin: 12.4 g/dL — ABNORMAL LOW (ref 13.0–17.0)
MCH: 28.9 pg (ref 26.0–34.0)
MCHC: 32.4 g/dL (ref 30.0–36.0)
MCV: 89.3 fL (ref 80.0–100.0)
Platelets: 156 10*3/uL (ref 150–400)
RBC: 4.29 MIL/uL (ref 4.22–5.81)
RDW: 14.3 % (ref 11.5–15.5)
WBC: 8.4 10*3/uL (ref 4.0–10.5)
nRBC: 0 % (ref 0.0–0.2)

## 2023-08-23 LAB — GLUCOSE, CAPILLARY
Glucose-Capillary: 144 mg/dL — ABNORMAL HIGH (ref 70–99)
Glucose-Capillary: 185 mg/dL — ABNORMAL HIGH (ref 70–99)

## 2023-08-23 SURGERY — REPAIR, HERNIA, INCISIONAL
Anesthesia: General

## 2023-08-23 MED ORDER — HYDROMORPHONE HCL 1 MG/ML IJ SOLN
0.2500 mg | INTRAMUSCULAR | Status: DC | PRN
  Administered 2023-08-23: 0.25 mg via INTRAVENOUS

## 2023-08-23 MED ORDER — LIDOCAINE HCL (PF) 2 % IJ SOLN: INTRAMUSCULAR | Status: DC | PRN

## 2023-08-23 MED ORDER — CELECOXIB 200 MG PO CAPS
400.0000 mg | ORAL_CAPSULE | ORAL | Status: AC
  Administered 2023-08-23: 400 mg via ORAL
  Filled 2023-08-23: qty 2

## 2023-08-23 MED ORDER — FENTANYL CITRATE (PF) 100 MCG/2ML IJ SOLN
INTRAMUSCULAR | Status: DC | PRN
  Administered 2023-08-23: 100 ug via INTRAVENOUS

## 2023-08-23 MED ORDER — PHENYLEPHRINE 80 MCG/ML (10ML) SYRINGE FOR IV PUSH (FOR BLOOD PRESSURE SUPPORT)
PREFILLED_SYRINGE | INTRAVENOUS | Status: AC
  Filled 2023-08-23: qty 10

## 2023-08-23 MED ORDER — METHOCARBAMOL 1000 MG/10ML IJ SOLN: 500.0000 mg | Freq: Four times a day (QID) | INTRAMUSCULAR | Status: DC | PRN

## 2023-08-23 MED ORDER — ROCURONIUM BROMIDE 100 MG/10ML IV SOLN
INTRAVENOUS | Status: DC | PRN
Start: 2023-08-23 — End: 2023-08-23
  Administered 2023-08-23: 60 mg via INTRAVENOUS
  Administered 2023-08-23: 10 mg via INTRAVENOUS

## 2023-08-23 MED ORDER — SUGAMMADEX SODIUM 200 MG/2ML IV SOLN
INTRAVENOUS | Status: DC | PRN
  Administered 2023-08-23: 200 mg via INTRAVENOUS

## 2023-08-23 MED ORDER — CHLORHEXIDINE GLUCONATE CLOTH 2 % EX PADS: 6.0000 | MEDICATED_PAD | Freq: Once | CUTANEOUS | Status: DC

## 2023-08-23 MED ORDER — PROPOFOL 10 MG/ML IV BOLUS
INTRAVENOUS | Status: DC | PRN
  Administered 2023-08-23 (×2): 20 mg via INTRAVENOUS
  Administered 2023-08-23: 130 mg via INTRAVENOUS

## 2023-08-23 MED ORDER — INSULIN ASPART 100 UNIT/ML IJ SOLN: 0.0000 [IU] | INTRAMUSCULAR | Status: DC | PRN

## 2023-08-23 MED ORDER — OXYCODONE HCL 5 MG PO TABS: 5.0000 mg | ORAL_TABLET | ORAL | Status: DC | PRN

## 2023-08-23 MED ORDER — ALBUMIN HUMAN 5 % IV SOLN
INTRAVENOUS | Status: AC
  Filled 2023-08-23: qty 250

## 2023-08-23 MED ORDER — PROPOFOL 10 MG/ML IV BOLUS
INTRAVENOUS | Status: AC
  Filled 2023-08-23: qty 20

## 2023-08-23 MED ORDER — LIDOCAINE HCL (PF) 2 % IJ SOLN
INTRAMUSCULAR | Status: AC
  Filled 2023-08-23: qty 5

## 2023-08-23 MED ORDER — ACETAMINOPHEN 500 MG PO TABS: 1000.0000 mg | ORAL_TABLET | Freq: Once | ORAL | Status: DC

## 2023-08-23 MED ORDER — LACTATED RINGERS IV SOLN: INTRAVENOUS | Status: DC

## 2023-08-23 MED ORDER — BUPIVACAINE LIPOSOME 1.3 % IJ SUSP
INTRAMUSCULAR | Status: AC
  Filled 2023-08-23: qty 20

## 2023-08-23 MED ORDER — EPHEDRINE SULFATE-NACL 50-0.9 MG/10ML-% IV SOSY
PREFILLED_SYRINGE | INTRAVENOUS | Status: DC | PRN
Start: 2023-08-23 — End: 2023-08-23
  Administered 2023-08-23 (×5): 5 mg via INTRAVENOUS

## 2023-08-23 MED ORDER — ONDANSETRON HCL 4 MG/2ML IJ SOLN
INTRAMUSCULAR | Status: DC | PRN
  Administered 2023-08-23: 4 mg via INTRAVENOUS

## 2023-08-23 MED ORDER — LIDOCAINE HCL (PF) 2 % IJ SOLN
INTRAMUSCULAR | Status: DC | PRN
  Administered 2023-08-23: 1.5 mg/kg/h

## 2023-08-23 MED ORDER — FENTANYL CITRATE (PF) 100 MCG/2ML IJ SOLN
INTRAMUSCULAR | Status: AC
  Filled 2023-08-23: qty 2

## 2023-08-23 MED ORDER — LIDOCAINE HCL 2 % IJ SOLN
INTRAMUSCULAR | Status: AC
  Filled 2023-08-23: qty 20

## 2023-08-23 MED ORDER — OXYCODONE HCL 5 MG PO TABS: 10.0000 mg | ORAL_TABLET | ORAL | Status: DC | PRN

## 2023-08-23 MED ORDER — TAMSULOSIN HCL 0.4 MG PO CAPS
0.4000 mg | ORAL_CAPSULE | Freq: Every evening | ORAL | Status: DC
  Administered 2023-08-23 – 2023-08-24 (×2): 0.4 mg via ORAL
  Filled 2023-08-23 (×2): qty 1

## 2023-08-23 MED ORDER — LIDOCAINE HCL (CARDIAC) PF 100 MG/5ML IV SOSY
PREFILLED_SYRINGE | INTRAVENOUS | Status: DC | PRN
  Administered 2023-08-23: 60 mg via INTRAVENOUS

## 2023-08-23 MED ORDER — BUPIVACAINE-EPINEPHRINE 0.25% -1:200000 IJ SOLN
INTRAMUSCULAR | Status: DC | PRN
  Administered 2023-08-23: 30 mL

## 2023-08-23 MED ORDER — BUPIVACAINE-EPINEPHRINE (PF) 0.25% -1:200000 IJ SOLN
INTRAMUSCULAR | Status: AC
  Filled 2023-08-23: qty 30

## 2023-08-23 MED ORDER — FLUTICASONE PROPIONATE 50 MCG/ACT NA SUSP
2.0000 | Freq: Every day | NASAL | Status: DC
  Administered 2023-08-24 – 2023-08-25 (×2): 2 via NASAL
  Filled 2023-08-23: qty 16

## 2023-08-23 MED ORDER — LACTATED RINGERS IV SOLN
INTRAVENOUS | Status: DC
Start: 2023-08-23 — End: 2023-08-24

## 2023-08-23 MED ORDER — CEFAZOLIN SODIUM-DEXTROSE 2-4 GM/100ML-% IV SOLN
2.0000 g | INTRAVENOUS | Status: AC
  Administered 2023-08-23: 2 g via INTRAVENOUS
  Filled 2023-08-23: qty 100

## 2023-08-23 MED ORDER — SUGAMMADEX SODIUM 200 MG/2ML IV SOLN
INTRAVENOUS | Status: AC
  Filled 2023-08-23: qty 2

## 2023-08-23 MED ORDER — ORAL CARE MOUTH RINSE: 15.0000 mL | Freq: Once | OROMUCOSAL | Status: AC

## 2023-08-23 MED ORDER — HEPARIN SODIUM (PORCINE) 5000 UNIT/ML IJ SOLN
5000.0000 [IU] | Freq: Once | INTRAMUSCULAR | Status: AC
  Administered 2023-08-23: 5000 [IU] via SUBCUTANEOUS
  Filled 2023-08-23: qty 1

## 2023-08-23 MED ORDER — SIMETHICONE 80 MG PO CHEW
80.0000 mg | CHEWABLE_TABLET | Freq: Four times a day (QID) | ORAL | Status: DC | PRN
  Administered 2023-08-24: 80 mg via ORAL
  Filled 2023-08-23: qty 1

## 2023-08-23 MED ORDER — ROCURONIUM BROMIDE 10 MG/ML (PF) SYRINGE
PREFILLED_SYRINGE | INTRAVENOUS | Status: AC
  Filled 2023-08-23: qty 10

## 2023-08-23 MED ORDER — ENOXAPARIN SODIUM 40 MG/0.4ML IJ SOSY
40.0000 mg | PREFILLED_SYRINGE | INTRAMUSCULAR | Status: DC
  Administered 2023-08-24 – 2023-08-25 (×2): 40 mg via SUBCUTANEOUS
  Filled 2023-08-23 (×2): qty 0.4

## 2023-08-23 MED ORDER — 0.9 % SODIUM CHLORIDE (POUR BTL) OPTIME
TOPICAL | Status: DC | PRN
  Administered 2023-08-23: 1000 mL

## 2023-08-23 MED ORDER — PHENYLEPHRINE 80 MCG/ML (10ML) SYRINGE FOR IV PUSH (FOR BLOOD PRESSURE SUPPORT)
PREFILLED_SYRINGE | INTRAVENOUS | Status: DC | PRN
  Administered 2023-08-23: 120 ug via INTRAVENOUS
  Administered 2023-08-23 (×5): 80 ug via INTRAVENOUS
  Administered 2023-08-23: 160 ug via INTRAVENOUS
  Administered 2023-08-23: 80 ug via INTRAVENOUS
  Administered 2023-08-23 (×3): 160 ug via INTRAVENOUS
  Administered 2023-08-23: 120 ug via INTRAVENOUS
  Administered 2023-08-23: 160 ug via INTRAVENOUS
  Administered 2023-08-23: 80 ug via INTRAVENOUS

## 2023-08-23 MED ORDER — METHOCARBAMOL 750 MG PO TABS: 750.0000 mg | ORAL_TABLET | Freq: Four times a day (QID) | ORAL | 0 refills | Status: DC | PRN

## 2023-08-23 MED ORDER — CHLORHEXIDINE GLUCONATE 0.12 % MT SOLN
15.0000 mL | Freq: Once | OROMUCOSAL | Status: AC
  Administered 2023-08-23: 15 mL via OROMUCOSAL

## 2023-08-23 MED ORDER — DEXAMETHASONE SODIUM PHOSPHATE 10 MG/ML IJ SOLN
INTRAMUSCULAR | Status: AC
  Filled 2023-08-23: qty 1

## 2023-08-23 MED ORDER — ACETAMINOPHEN 500 MG PO TABS
1000.0000 mg | ORAL_TABLET | ORAL | Status: AC
  Administered 2023-08-23: 1000 mg via ORAL
  Filled 2023-08-23: qty 2

## 2023-08-23 MED ORDER — ATORVASTATIN CALCIUM 40 MG PO TABS
40.0000 mg | ORAL_TABLET | Freq: Every day | ORAL | Status: DC
  Administered 2023-08-24 – 2023-08-25 (×2): 40 mg via ORAL
  Filled 2023-08-23 (×2): qty 1

## 2023-08-23 MED ORDER — HYDROCHLOROTHIAZIDE 12.5 MG PO TABS
12.5000 mg | ORAL_TABLET | Freq: Every day | ORAL | Status: DC
  Administered 2023-08-23 – 2023-08-25 (×3): 12.5 mg via ORAL
  Filled 2023-08-23 (×3): qty 1

## 2023-08-23 MED ORDER — BUPIVACAINE LIPOSOME 1.3 % IJ SUSP: 20.0000 mL | Freq: Once | INTRAMUSCULAR | Status: DC

## 2023-08-23 MED ORDER — GABAPENTIN 300 MG PO CAPS
300.0000 mg | ORAL_CAPSULE | ORAL | Status: AC
  Administered 2023-08-23: 300 mg via ORAL
  Filled 2023-08-23: qty 1

## 2023-08-23 MED ORDER — OXYCODONE-ACETAMINOPHEN 5-325 MG PO TABS: 1.0000 | ORAL_TABLET | ORAL | 0 refills | Status: DC | PRN

## 2023-08-23 MED ORDER — ONDANSETRON HCL 4 MG/2ML IJ SOLN: 4.0000 mg | Freq: Four times a day (QID) | INTRAMUSCULAR | Status: DC | PRN

## 2023-08-23 MED ORDER — EPHEDRINE 5 MG/ML INJ
INTRAVENOUS | Status: AC
  Filled 2023-08-23: qty 5

## 2023-08-23 MED ORDER — HYDROMORPHONE HCL 1 MG/ML IJ SOLN
INTRAMUSCULAR | Status: AC
  Administered 2023-08-23: 0.25 mg via INTRAVENOUS
  Filled 2023-08-23: qty 1

## 2023-08-23 MED ORDER — PROCHLORPERAZINE EDISYLATE 10 MG/2ML IJ SOLN: 10.0000 mg | INTRAMUSCULAR | Status: DC | PRN

## 2023-08-23 MED ORDER — HYDROMORPHONE HCL 1 MG/ML IJ SOLN: 0.5000 mg | INTRAMUSCULAR | Status: DC | PRN

## 2023-08-23 MED ORDER — DOCUSATE SODIUM 100 MG PO CAPS
100.0000 mg | ORAL_CAPSULE | Freq: Two times a day (BID) | ORAL | Status: DC
  Administered 2023-08-23 – 2023-08-25 (×4): 100 mg via ORAL
  Filled 2023-08-23 (×4): qty 1

## 2023-08-23 MED ORDER — ACETAMINOPHEN 325 MG PO TABS
650.0000 mg | ORAL_TABLET | Freq: Four times a day (QID) | ORAL | Status: DC
  Administered 2023-08-23 – 2023-08-25 (×8): 650 mg via ORAL
  Filled 2023-08-23 (×8): qty 2

## 2023-08-23 MED ORDER — BUPIVACAINE LIPOSOME 1.3 % IJ SUSP
INTRAMUSCULAR | Status: DC | PRN
  Administered 2023-08-23: 20 mL

## 2023-08-23 MED ORDER — ALBUMIN HUMAN 5 % IV SOLN: INTRAVENOUS | Status: DC | PRN

## 2023-08-23 MED ORDER — ONDANSETRON HCL 4 MG/2ML IJ SOLN
INTRAMUSCULAR | Status: AC
  Filled 2023-08-23: qty 2

## 2023-08-23 SURGICAL SUPPLY — 36 items
BAG COUNTER SPONGE SURGICOUNT (BAG) ×1 IMPLANT
BINDER ABDOMINAL 12 ML 46-62 (SOFTGOODS) IMPLANT
CHLORAPREP W/TINT 26 (MISCELLANEOUS) ×1 IMPLANT
COVER SURGICAL LIGHT HANDLE (MISCELLANEOUS) ×1 IMPLANT
DERMABOND ADVANCED .7 DNX12 (GAUZE/BANDAGES/DRESSINGS) ×1 IMPLANT
DRAIN CHANNEL 19F RND (DRAIN) IMPLANT
DRAPE LAPAROSCOPIC ABDOMINAL (DRAPES) ×1 IMPLANT
DRAPE WARM FLUID 44X44 (DRAPES) ×1 IMPLANT
ELECT BLADE TIP CTD 4 INCH (ELECTRODE) ×1 IMPLANT
ELECT PENCIL ROCKER SW 15FT (MISCELLANEOUS) ×1 IMPLANT
ELECT REM PT RETURN 15FT ADLT (MISCELLANEOUS) ×1 IMPLANT
EVACUATOR SILICONE 100CC (DRAIN) IMPLANT
GAUZE 4X4 16PLY ~~LOC~~+RFID DBL (SPONGE) ×1 IMPLANT
GAUZE SPONGE 4X4 12PLY STRL (GAUZE/BANDAGES/DRESSINGS) ×1 IMPLANT
GLOVE BIO SURGEON STRL SZ7.5 (GLOVE) ×1 IMPLANT
GLOVE INDICATOR 8.0 STRL GRN (GLOVE) ×1 IMPLANT
GOWN STRL REUS W/ TWL XL LVL3 (GOWN DISPOSABLE) ×3 IMPLANT
KIT BASIN OR (CUSTOM PROCEDURE TRAY) ×1 IMPLANT
KIT TURNOVER KIT A (KITS) IMPLANT
MARKER SKIN DUAL TIP RULER LAB (MISCELLANEOUS) ×1 IMPLANT
MESH SOFT 12X12IN BARD (Mesh General) IMPLANT
PACK GENERAL/GYN (CUSTOM PROCEDURE TRAY) ×1 IMPLANT
SPONGE DRAIN TRACH 4X4 STRL 2S (GAUZE/BANDAGES/DRESSINGS) IMPLANT
SUT ETHILON 2 0 PS N (SUTURE) IMPLANT
SUT MNCRL AB 4-0 PS2 18 (SUTURE) ×1 IMPLANT
SUT PDS AB 0 CT 36 (SUTURE) ×2 IMPLANT
SUT PDS AB 0 CT1 36 (SUTURE) IMPLANT
SUT VIC AB 2-0 SH 18 (SUTURE) IMPLANT
SUT VIC AB 3-0 SH 8-18 (SUTURE) ×1 IMPLANT
SUT VICRYL 2 0 18 UND BR (SUTURE) ×2 IMPLANT
SUT VICRYL 2-0 SH 8X27 (SUTURE) ×1 IMPLANT
SUT VICRYL 3-0 CR8 SH (SUTURE) IMPLANT
TAPE PAPER 3X10 WHT MICROPORE (GAUZE/BANDAGES/DRESSINGS) IMPLANT
TOWEL OR 17X26 10 PK STRL BLUE (TOWEL DISPOSABLE) ×1 IMPLANT
TOWEL ~~LOC~~+RFID 17X26 BLUE (SPONGE) ×1 IMPLANT
TRAY FOLEY MTR SLVR 16FR STAT (SET/KITS/TRAYS/PACK) IMPLANT

## 2023-08-23 NOTE — Transfer of Care (Signed)
 Immediate Anesthesia Transfer of Care Note  Patient: Edward Newton  Procedure(s) Performed: OPEN INCISIONAL HERNIA REPAIR WITH MESH, BILATERAL POSTERIOR RECTUS MYOFASCIAL RELEASE, BILATERAL TRANSVERSUS ABDOMINS MYOFASCIAL RELEASE, BILATERAL TAP BLOCK  Patient Location: PACU  Anesthesia Type:General  Level of Consciousness: drowsy  Airway & Oxygen Therapy: Patient Spontanous Breathing and Patient connected to face mask oxygen  Post-op Assessment: Report given to RN and Post -op Vital signs reviewed and stable  Post vital signs: Reviewed and stable  Last Vitals:  Vitals Value Taken Time  BP 107/66 08/23/23 1115  Temp 36.8 C 08/23/23 1113  Pulse 83 08/23/23 1116  Resp 16 08/23/23 1116  SpO2 100 % 08/23/23 1116  Vitals shown include unfiled device data.  Last Pain:  Vitals:   08/23/23 0608  TempSrc:   PainSc: 0-No pain         Complications: No notable events documented.

## 2023-08-23 NOTE — Discharge Instructions (Signed)

## 2023-08-23 NOTE — Plan of Care (Signed)
 ?  Problem: Clinical Measurements: ?Goal: Ability to maintain clinical measurements within normal limits will improve ?Outcome: Progressing ?Goal: Will remain free from infection ?Outcome: Progressing ?Goal: Diagnostic test results will improve ?Outcome: Progressing ?  ?
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
## 2023-08-23 NOTE — Op Note (Signed)
 Patient: Edward Newton (August 20, 1940, [id number])  Date of Surgery: 08/23/2023  Preoperative Diagnosis: INCISIONAL HERNIA (8.8 cm wide by 15.3 cm tall on preoperative CT)  Postoperative Diagnosis: INCISIONAL HERNIA (8.8 cm wide by 15.3 cm tall on preoperative CT)   Surgical Procedure:  OPEN INCISIONAL HERNIA REPAIR WITH MESH BILATERAL POSTERIOR RECTUS MYOFASCIAL RELEASE BILATERAL TRANSVERSUS ABDOMINS MYOFASCIAL RELEASE BILATERAL TAP BLOCK  Operative Team Members:  Surgeons and Role:    * Laithan Conchas, Hyman Hopes, MD - Primary    * Maczis, Hedda Slade, [place] - Assisting   Anesthesiologist: Gaynelle Adu, MD CRNA: Vanessa Tat Momoli, CRNA; Maurene Capes, CRNA   Anesthesia: General   Fluids:  Total I/O In: 1600 [I.V.:1000; IV Piggyback:600] Out: 530 [Urine:230; Blood:300]  Complications: None  Drains:   19 Fr. Blake drain in the retromuscular position  Specimen: None  Disposition:  PACU - hemodynamically stable.  Plan of Care: Discharge to home after PACU  Indications for Procedure: Edward Newton is a 83 y.o. male who presented with an incisional Hernia 8.8 cm wide by 15.3 cm tall on preoperative CT.  Edward Newton presents with an incisional hernia at the site of a previous abdominal surgery from November 2023. The hernia causes significant discomfort without nausea or vomiting. It is expected to enlarge and become more bothersome. He desires surgical repair. I recommend open incisional hernia repair with mesh, bilateral posterior rectus myofascial release and bilateral transversus abdominis release. The proposed surgery involves excising the wide scar, repositioning abdominal contents, and repairing the muscle layer with mesh placement behind the rectus abdominis and transversus abdominis muscles. The transversus abdominis muscles will be divided to provide additional mesh overlap and tissue advancement. Risks include infection, bleeding, and potential intestinal  injury, though these complications are rare. Benefits include reduced discomfort and improved scar appearance. Alternatives include monitoring, which may lead to increased size and discomfort.   Findings:  Hernia Location: Ventral hernia location: Epigastric (M2) and Umbilical (M3) Hernia Size:  8.8 cm wide x 15.3 cm tall  Mesh Size &Type:  30 cm x 30 cm  Bard Soft Mesh Mesh Position: Sublay - Retromuscular Myofascial Releases: Bilateral posterior rectus myofascial release, bilateral transversus abdominis release  Description of Procedure:  The patient was positioned supine on the operating room table, adequately padded and secured.  A timeout procedure was performed.  A midline incision was made and dissection was carried down through subcutaneous tissue. The prior scar was excised, completely. The abdomen was entered safely.  There were few adhesions.  A meticulous and tedious sharp lysis of adhesions was carried out.  This was completed with no injury to the viscera.  A Vicryl suture was placed on the cecum and on the antimesenteric portion of the distal small intestine in areas where the bovie electrocautery came close to the bowel.    After the anterior abdominal wall was cleared off of all adhesions, a large safety towel was placed over the viscera to protect them.  The hernia defect was located in the Epigastric (M2) and Umbilical (M3) regions. The total hernia defect area was measured and the size was recorded above under findings.  A rectus myofascial release was performed on the LEFT side. The posterior rectus sheath was incised just lateral to the hernia edge.  This incision in the posterior rectus sheath was extended both cephalad and caudal to the hernia defect.  Dissection was carried out laterally in the retromuscular plane to the edge of the rectus sheath progressively disconnecting the rectus  muscle from the underlying posterior rectus sheath. The segmental innervation comprised of  intercostal nerve branches 8, 9, 10, 11 and 12 were individually identified and preserved.  The arterial blood supply to the rectus muscle comprised of the superior and inferior epigastric arteries along with the small terminal branches from the posterior intercostal arteries 10, 11 and 12, the subcostal artery, the posterior lumbar arteries, and the deep circumflex artery were all identified and individually preserved.  The rectus myofascial release accomplished medialization of the posterior rectus sheath towards the midline and disinsertion of the rectus muscle from its surrounding fascia, and thus its encasement in the rectus sheath, allowing for widening of the rectus muscle and transfer of the rectus flap towards the midline.  This will allow for future inset of the medial aspect of the flap for abdominal wall reconstruction.  A transversus abdominis release (TAR) was performed on the LEFT side.  The transversus abdominis muscle was identified deep to the posterior rectus sheath and incised vertically along its entire length, entering the pre-peritoneal or pre-transversalis fascia plane.  This disinserted the transversus abdominis muscle from the linea semilunaris.  Since the intercostal nerves, arteries and veins had been preserved during the rectus myofascial release portion of the procedure, they remained intact during the TAR.  The peritoneum was subsequently peeled away from the underside of the divided transversus abdominis muscle.  This dissection was carried out laterally towards the retroperitoneum.  The TAR accomplished additional medialization of the posterior rectus sheath with its attached peritoneum towards the midline to allow for visceral sac closure.  The TAR also provided further offset of tension of the rectus muscle flap with additional transfer of the rectus muscle towards the midline, as it remained attached to the external and internal abdominal oblique muscles.  This will allow for  future inset of the medial aspect of the flap for abdominal wall reconstruction.  A rectus myofascial release was performed on the RIGHT side. The posterior rectus sheath was incised just lateral to the hernia edge.  This incision in the posterior rectus sheath was extended both cephalad and caudal to the hernia defect.  Dissection was carried out laterally in the retromuscular plane to the edge of the rectus sheath progressively disconnecting the rectus muscle from the underlying posterior rectus sheath. The segmental innervation comprised of intercostal nerve branches 8, 9, 10, 11 and 12 were individually identified and preserved.  The arterial blood supply to the rectus muscle comprised of the superior and inferior epigastric arteries along with the small terminal branches from the posterior intercostal arteries 10, 11 and 12, the subcostal artery, the posterior lumbar arteries, and the deep circumflex artery were all identified and individually preserved.  The rectus myofascial release accomplished medialization of the posterior rectus sheath towards the midline and disinsertion of the rectus muscle from its surrounding fascia, and thus its encasement in the rectus sheath, allowing for widening of the rectus muscle and transfer of the rectus flap towards the midline.  This will allow for future inset of the medial aspect of the flap for abdominal wall reconstruction.  A transversus abdominis release (TAR) was performed on the RIGHT side.   The transversus abdominis muscle was identified deep to the posterior rectus sheath and incised vertically along its entire length, entering the pre-peritoneal or pre-transversalis fascia plane.  This disinserted the transversus abdominis muscle from the linea semilunaris.  Since the intercostal nerves, arteries and veins had been preserved during the rectus myofascial release portion of the  procedure, they remained intact during the TAR. The peritoneum was subsequently  peeled away from the underside of the divided transversus abdominis muscle.  This dissection was carried out laterally towards the retroperitoneum.  The TAR accomplished additional medialization of the posterior rectus sheath with its attached peritoneum towards the midline to allow for visceral sac closure.  The TAR also provided further offset of tension of the rectus muscle flap with additional transfer of the rectus muscle towards the midline, as it remained attached to the external and internal abdominal oblique muscles.  This will allow for future inset of the medial aspect of the flap for abdominal wall reconstruction.   Lateral peritoneal defects were repaired using vicryl suture.  A large defect in the left subcostal region was patched with omentum as it was unable to be pulled together.  The towel was removed and the posterior fascia was reapproximated in the midline with a continuous 2-0 Vicryl suture. The retromuscular plane was copiously irrigated with warm normal saline. There was good hemostasis of the operative field.   A transversus abdominis plane (TAP) block was performed bilaterally with Exparel and marcaine.  The anesthetic was injected into the plane between the transversus abdominis and internal abdominal oblique muscle bilaterally.  A new set of sterile gloves were used prior to handling the mesh. The mesh listed above was brought into the operative field and cut to the size specified above.  The mesh was deployed into the retromuscular position where it conformed well to the space.  It did not require any fixation.  The mesh space was irrigated with saline.  One 63 [place] Blake drain was placed in the retromuscular position. The rectus muscles were re approximated in the midline utilizing a continuous 0 PDS suture taking 5 mm bites of the fascia with 5 mm advancement. The fascia came together well. The subcutaneous tissue was irrigated with saline. Scarpa's fascia was  reapproximated with interrupted 2-0 Vicryl suture.  The subcuticular tissue was reapproximated with buried, interrupted 2-0 Vicryl suture.  The skin was closed with a 4-0 Monocryl subcuticular suture and skin glue.  All sponge and needle counts were correct at the end of this case.   Ivar Drape, MD General, Bariatric, & Minimally Invasive Surgery [facility], [place]

## 2023-08-23 NOTE — Plan of Care (Signed)

## 2023-08-23 NOTE — Anesthesia Postprocedure Evaluation (Signed)
 Anesthesia Post Note  Patient: Brnadon Eoff  Procedure(s) Performed: OPEN INCISIONAL HERNIA REPAIR WITH MESH, BILATERAL POSTERIOR RECTUS MYOFASCIAL RELEASE, BILATERAL TRANSVERSUS ABDOMINS MYOFASCIAL RELEASE, BILATERAL TAP BLOCK     Patient location during evaluation: PACU Anesthesia Type: General Level of consciousness: awake and alert Pain management: pain level controlled Vital Signs Assessment: post-procedure vital signs reviewed and stable Respiratory status: spontaneous breathing, nonlabored ventilation, respiratory function stable and patient connected to nasal cannula oxygen Cardiovascular status: blood pressure returned to baseline and stable Postop Assessment: no apparent nausea or vomiting Anesthetic complications: no  No notable events documented.  Last Vitals:  Vitals:   08/23/23 1215 08/23/23 1220  BP: 117/67 111/66  Pulse: 81 81  Resp: 12 13  Temp:    SpO2: 98% 98%    Last Pain:  Vitals:   08/23/23 1215  TempSrc:   PainSc: Asleep                 Kailo Kosik,W. EDMOND

## 2023-08-23 NOTE — Anesthesia Procedure Notes (Signed)
 Procedure Name: Intubation Date/Time: 08/23/2023 7:42 AM  Performed by: Maurene Capes, CRNAPre-anesthesia Checklist: Patient identified, Emergency Drugs available, Suction available and Patient being monitored Patient Re-evaluated:Patient Re-evaluated prior to induction Oxygen Delivery Method: Circle System Utilized Preoxygenation: Pre-oxygenation with 100% oxygen Induction Type: IV induction Ventilation: Mask ventilation without difficulty Laryngoscope Size: Mac and 4 Tube type: Oral Tube size: 7.5 mm Number of attempts: 1 Airway Equipment and Method: Stylet Placement Confirmation: ETT inserted through vocal cords under direct vision, positive ETCO2 and breath sounds checked- equal and bilateral Secured at: 22 cm Tube secured with: Tape Dental Injury: Teeth and Oropharynx as per pre-operative assessment

## 2023-08-24 ENCOUNTER — Encounter (HOSPITAL_COMMUNITY): Payer: Self-pay | Admitting: Surgery

## 2023-08-24 DIAGNOSIS — K469 Unspecified abdominal hernia without obstruction or gangrene: Secondary | ICD-10-CM | POA: Diagnosis present

## 2023-08-24 DIAGNOSIS — E119 Type 2 diabetes mellitus without complications: Secondary | ICD-10-CM | POA: Diagnosis not present

## 2023-08-24 DIAGNOSIS — I10 Essential (primary) hypertension: Secondary | ICD-10-CM | POA: Diagnosis not present

## 2023-08-24 DIAGNOSIS — K432 Incisional hernia without obstruction or gangrene: Secondary | ICD-10-CM | POA: Diagnosis not present

## 2023-08-24 DIAGNOSIS — Z79899 Other long term (current) drug therapy: Secondary | ICD-10-CM | POA: Diagnosis not present

## 2023-08-24 DIAGNOSIS — Z87891 Personal history of nicotine dependence: Secondary | ICD-10-CM | POA: Diagnosis not present

## 2023-08-24 DIAGNOSIS — Z85818 Personal history of malignant neoplasm of other sites of lip, oral cavity, and pharynx: Secondary | ICD-10-CM | POA: Diagnosis not present

## 2023-08-24 DIAGNOSIS — R2681 Unsteadiness on feet: Secondary | ICD-10-CM | POA: Diagnosis not present

## 2023-08-24 LAB — BASIC METABOLIC PANEL
Anion gap: 6 (ref 5–15)
BUN: 22 mg/dL (ref 8–23)
CO2: 24 mmol/L (ref 22–32)
Calcium: 8.1 mg/dL — ABNORMAL LOW (ref 8.9–10.3)
Chloride: 107 mmol/L (ref 98–111)
Creatinine, Ser: 1.39 mg/dL — ABNORMAL HIGH (ref 0.61–1.24)
GFR, Estimated: 51 mL/min — ABNORMAL LOW (ref >60)
Glucose, Bld: 145 mg/dL — ABNORMAL HIGH (ref 70–99)
Potassium: 3.9 mmol/L (ref 3.5–5.1)
Sodium: 137 mmol/L (ref 135–145)

## 2023-08-24 LAB — CBC
HCT: 36.1 % — ABNORMAL LOW (ref 39.0–52.0)
Hemoglobin: 11.7 g/dL — ABNORMAL LOW (ref 13.0–17.0)
MCH: 29.2 pg (ref 26.0–34.0)
MCHC: 32.4 g/dL (ref 30.0–36.0)
MCV: 90 fL (ref 80.0–100.0)
Platelets: 117 10*3/uL — ABNORMAL LOW (ref 150–400)
RBC: 4.01 MIL/uL — ABNORMAL LOW (ref 4.22–5.81)
RDW: 14.6 % (ref 11.5–15.5)
WBC: 6.3 10*3/uL (ref 4.0–10.5)
nRBC: 0 % (ref 0.0–0.2)

## 2023-08-24 NOTE — Care Management CC44 (Signed)
 Condition Code 44 Documentation Completed  Patient Details  Name: Edward Newton MRN: [id number] Date of Birth: [birth year]   Condition Code 44 given:  Yes Patient signature on Condition Code 44 notice:  Yes Documentation of 2 MD's agreement:  Yes Code 44 added to claim:  Yes    Howell Rucks, RN 08/24/2023, 3:11 PM

## 2023-08-24 NOTE — TOC Progression Note (Signed)
 Transition of Care [facility]) - Progression Note    Patient Details  Name: Edward Newton MRN: [id number] Date of Birth: [birth year]  Transition of [facility]) CM/SW Contact  Howell Rucks, RN Phone Number: 08/24/2023, 1:54 PM  Clinical Narrative: Met with pt at bedside to introduce role of TOC/NCM and review  Code 44/MOON, pt voiced understanding, form signed, copy provided to patient.          Expected Discharge Plan and Services                                               Social Determinants of Health (SDOH) Interventions SDOH Screenings   Food Insecurity: No Food Insecurity (08/23/2023)  Housing: Low Risk  (08/23/2023)  Transportation Needs: No Transportation Needs (08/23/2023)  Utilities: Not At Risk (08/23/2023)  Alcohol Screen: Low Risk  (09/14/2022)  Depression (PHQ2-9): Low Risk  (07/07/2023)  Financial Resource Strain: Low Risk  (09/14/2022)  Physical Activity: Insufficiently Active (09/14/2022)  Social Connections: Socially Integrated (08/23/2023)  Stress: No Stress Concern Present (09/14/2022)  Tobacco Use: Medium Risk (08/23/2023)    Readmission Risk Interventions    08/24/2023   11:10 AM  Readmission Risk Prevention Plan  Post Dischage Appt Complete  Medication Screening Complete  Transportation Screening Complete

## 2023-08-24 NOTE — Evaluation (Signed)
 Physical Therapy Evaluation-1x Patient Details Name: Edward Newton MRN: [id number] DOB: Jun 06, 1941 Today's Date: 08/24/2023  History of Present Illness  83 yo male s/p hernia repair with mesh, myofascial release 08/23/23. hx of SB resection 2023, OA, DM  Clinical Impression  On eval, pt was Mod Ind with mobility. He walked ~300 feet with a RW then another ~100 feet without a device. Pt reported some pain with ambulation-more so when walking without a device. Pt could potentially benefit from RW use if he decides he wishes to take one home. No acute PT needs at this time. No follow up PT recommendations. Recommend daily ambulation in hallway with nursing and/or mobility team supervision as needed.         If plan is discharge home, recommend the following:     Can travel by private vehicle        Equipment Recommendations Rolling walker (2 wheels) (if pt decides he wants to take one home)  Recommendations for Other Services       Functional Status Assessment Patient has had a recent decline in their functional status and demonstrates the ability to make significant improvements in function in a reasonable and predictable amount of time.     Precautions / Restrictions Precautions Precautions: Fall Restrictions Weight Bearing Restrictions Per Provider Order: No      Mobility  Bed Mobility               General bed mobility comments: oob in recliner    Transfers Overall transfer level: Modified independent Equipment used: Rolling walker (2 wheels)                    Ambulation/Gait Ambulation/Gait assistance: Modified independent (Device/Increase time) Gait Distance (Feet): 300 Feet (300' c RW; 100' s device) Assistive device: Rolling walker (2 wheels), None Gait Pattern/deviations: Step-through pattern, Decreased stride length       General Gait Details: Pt walked ~300 feet with RW then another ~100 feet without a device. Pt reported some increaesd abd  pain without RW support-likely due to flexed trunk positioning with RW use. Pt's posture better when walking without RW which likely puts more stretch on tight abdomen  Stairs            Wheelchair Mobility     Tilt Bed    Modified Rankin (Stroke Patients Only)       Balance Overall balance assessment: Needs assistance         Standing balance support: Bilateral upper extremity supported, During functional activity Standing balance-Leahy Scale: Poor                               Pertinent Vitals/Pain Pain Assessment Pain Assessment: Faces Faces Pain Scale: Hurts a little bit Pain Location: abdomen Pain Intervention(s): Limited activity within patient's tolerance, Monitored during session, Repositioned    Home Living Family/patient expects to be discharged to:: Private residence Living Arrangements: Spouse/significant other Available Help at Discharge: Family Type of Home: House Home Access: Level entry       Home Layout: Bed/bath upstairs Home Equipment: Gilmer Mor - single point      Prior Function Prior Level of Function : Independent/Modified Independent                     Extremity/Trunk Assessment   Upper Extremity Assessment Upper Extremity Assessment: Overall WFL for tasks assessed    Lower Extremity Assessment Lower Extremity  Assessment: Overall WFL for tasks assessed    Cervical / Trunk Assessment Cervical / Trunk Assessment: Normal  Communication   Communication Communication: No apparent difficulties    Cognition Arousal: Alert Behavior During Therapy: WFL for tasks assessed/performed   PT - Cognitive impairments: No apparent impairments                         Following commands: Intact       Cueing Cueing Techniques: Verbal cues     General Comments      Exercises     Assessment/Plan    PT Assessment Patient does not need any further PT services  PT Problem List         PT Treatment  Interventions      PT Goals (Current goals can be found in the Care Plan section)  Acute Rehab PT Goals Patient Stated Goal: regain plof. PT Goal Formulation: All assessment and education complete, DC therapy    Frequency       Co-evaluation               AM-PAC PT "6 Clicks" Mobility  Outcome Measure Help needed turning from your back to your side while in a flat bed without using bedrails?: None Help needed moving from lying on your back to sitting on the side of a flat bed without using bedrails?: None Help needed moving to and from a bed to a chair (including a wheelchair)?: None Help needed standing up from a chair using your arms (e.g., wheelchair or bedside chair)?: None Help needed to walk in hospital room?: None Help needed climbing 3-5 steps with a railing? : A Little 6 Click Score: 23    End of Session   Activity Tolerance: Patient tolerated treatment well Patient left: in bed;with call bell/phone within reach        Time: 1130-1140 PT Time Calculation (min) (ACUTE ONLY): 10 min   Charges:   PT Evaluation $PT Eval Low Complexity: 1 Low   PT General Charges $$ ACUTE PT VISIT: 1 Visit            Faye Ramsay, PT Acute Rehabilitation  Office: [phone]

## 2023-08-24 NOTE — Plan of Care (Signed)

## 2023-08-24 NOTE — Progress Notes (Signed)
 Progress Note: General Surgery Service   Chief Complaint/Subjective: Doing okay.  Sore.  Not up much  Objective: Vital signs in last 24 hours: Temp:  [97.9 F (36.6 C)-98.7 F (37.1 C)] 98.7 F (37.1 C) (03/11 0102) Pulse Rate:  [77-86] 86 (03/11 0102) Resp:  [12-19] 19 (03/11 0102) BP: (103-134)/(63-72) 122/64 (03/11 0102) SpO2:  [96 %-100 %] 96 % (03/11 0102) Last BM Date : 08/23/23  Intake/Output from previous day: 03/10 0701 - 03/11 0700 In: 3246.9 [P.O.:380; I.V.:2266.9; IV Piggyback:600] Out: 1880 [Urine:1280; Drains:300; Blood:300] Intake/Output this shift: Total I/O In: -  Out: 500 [Urine:500]  Constitutional: NAD; conversant; no deformities Eyes: Moist conjunctiva; no lid lag; anicteric; PERRL Neck: Trachea midline; no thyromegaly Lungs: Normal respiratory effort; no tactile fremitus CV: RRR; no palpable thrills; no pitting edema GI: Abd incision c/d/I w/ glue, drain serosanguinous; no palpable hepatosplenomegaly MSK: Normal range of motion of extremities; no clubbing/cyanosis Psychiatric: Appropriate affect; alert and oriented x3 Lymphatic: No palpable cervical or axillary lymphadenopathy  Lab Results: CBC  Recent Labs    08/23/23 1346 08/24/23 0453  WBC 8.4 6.3  HGB 12.4* 11.7*  HCT 38.3* 36.1*  PLT 156 117*   BMET Recent Labs    08/23/23 1346 08/24/23 0453  NA  --  137  K  --  3.9  CL  --  107  CO2  --  24  GLUCOSE  --  145*  BUN  --  22  CREATININE 1.33* 1.39*  CALCIUM  --  8.1*   PT/INR No results for input(s): "LABPROT", "INR" in the last 72 hours. ABG No results for input(s): "PHART", "HCO3" in the last 72 hours.  Invalid input(s): "PCO2", "PO2"  Anti-infectives: Anti-infectives (From admission, onward)    Start     Dose/Rate Route Frequency Ordered Stop   08/23/23 0600  ceFAZolin (ANCEF) IVPB 2g/100 mL premix        2 g 200 mL/hr over 30 Minutes Intravenous On call to O.R. 08/23/23 0547 08/23/23 0744        Medications: Scheduled Meds:  acetaminophen  650 mg Oral Q6H   atorvastatin  40 mg Oral Daily   docusate sodium  100 mg Oral BID   enoxaparin (LOVENOX) injection  40 mg Subcutaneous Q24H   fluticasone  2 spray Each Nare Daily   hydrochlorothiazide  12.5 mg Oral Daily   tamsulosin  0.4 mg Oral QPM   Continuous Infusions: PRN Meds:.HYDROmorphone (DILAUDID) injection, methocarbamol (ROBAXIN) injection, ondansetron (ZOFRAN) IV, oxyCODONE, oxyCODONE, prochlorperazine, simethicone  Assessment/Plan: s/p Procedure(s): OPEN INCISIONAL HERNIA REPAIR WITH MESH, BILATERAL POSTERIOR RECTUS MYOFASCIAL RELEASE, BILATERAL TRANSVERSUS ABDOMINS MYOFASCIAL RELEASE, BILATERAL TAP BLOCK 08/23/2023  Doing well POD1 Needs to stay inpatient for pain control and increased mobility prior to discharge Work with PT/OT for postop mobilization after abdominal wall reconstruction Binder for comfort when he wants to wear it Regular diet JP drain to be removed on day of discharge   LOS: 0 days    Quentin Ore, MD  [facility] Use [email or web address] to contact on call provider  Daily Billing: [id number] - post op

## 2023-08-24 NOTE — Care Management Obs Status (Signed)
 MEDICARE OBSERVATION STATUS NOTIFICATION   Patient Details  Name: Edward Newton MRN: [id number] Date of Birth: 08-22-1940   Medicare Observation Status Notification Given:  Yes    Howell Rucks, RN 08/24/2023, 1:44 PM

## 2023-08-24 NOTE — Progress Notes (Signed)
   08/24/23 1110  TOC Brief Assessment  Insurance and Status Reviewed  Patient has primary care physician Yes  Home environment has been reviewed resides in private residence with spouse  Prior level of function: Independent  Prior/Current Home Services No current home services  Readmission risk has been reviewed Yes  Transition of care needs no transition of care needs at this time

## 2023-08-25 DIAGNOSIS — Z79899 Other long term (current) drug therapy: Secondary | ICD-10-CM | POA: Diagnosis not present

## 2023-08-25 DIAGNOSIS — E119 Type 2 diabetes mellitus without complications: Secondary | ICD-10-CM | POA: Diagnosis not present

## 2023-08-25 DIAGNOSIS — I10 Essential (primary) hypertension: Secondary | ICD-10-CM | POA: Diagnosis not present

## 2023-08-25 DIAGNOSIS — K432 Incisional hernia without obstruction or gangrene: Secondary | ICD-10-CM | POA: Diagnosis not present

## 2023-08-25 DIAGNOSIS — R2681 Unsteadiness on feet: Secondary | ICD-10-CM | POA: Diagnosis not present

## 2023-08-25 DIAGNOSIS — Z87891 Personal history of nicotine dependence: Secondary | ICD-10-CM | POA: Diagnosis not present

## 2023-08-25 DIAGNOSIS — Z85818 Personal history of malignant neoplasm of other sites of lip, oral cavity, and pharynx: Secondary | ICD-10-CM | POA: Diagnosis not present

## 2023-08-25 LAB — BASIC METABOLIC PANEL
Anion gap: 8 (ref 5–15)
BUN: 16 mg/dL (ref 8–23)
CO2: 26 mmol/L (ref 22–32)
Calcium: 8.4 mg/dL — ABNORMAL LOW (ref 8.9–10.3)
Chloride: 104 mmol/L (ref 98–111)
Creatinine, Ser: 1.16 mg/dL (ref 0.61–1.24)
GFR, Estimated: >60 mL/min (ref >60)
Glucose, Bld: 198 mg/dL — ABNORMAL HIGH (ref 70–99)
Potassium: 3.4 mmol/L — ABNORMAL LOW (ref 3.5–5.1)
Sodium: 138 mmol/L (ref 135–145)

## 2023-08-25 LAB — CBC
HCT: 35.8 % — ABNORMAL LOW (ref 39.0–52.0)
Hemoglobin: 11.5 g/dL — ABNORMAL LOW (ref 13.0–17.0)
MCH: 29 pg (ref 26.0–34.0)
MCHC: 32.1 g/dL (ref 30.0–36.0)
MCV: 90.4 fL (ref 80.0–100.0)
Platelets: 144 10*3/uL — ABNORMAL LOW (ref 150–400)
RBC: 3.96 MIL/uL — ABNORMAL LOW (ref 4.22–5.81)
RDW: 14.4 % (ref 11.5–15.5)
WBC: 5.6 10*3/uL (ref 4.0–10.5)
nRBC: 0 % (ref 0.0–0.2)

## 2023-08-25 NOTE — Progress Notes (Signed)
Pt was discharged home today. Instructions were reviewed with patient, and questions were answered. Pt was taken to main entrance via wheelchair by NT.  

## 2023-08-25 NOTE — Discharge Summary (Signed)
 Patient ID: Edward Newton [id number] 83 y.o. 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   Renal lesion 06/11/2022   Hepatic lesion 06/11/2022   Hypernatremia 05/30/2022   Generalized abdominal pain 05/27/2022   S/P small bowel resection 05/27/2022   Small bowel obstruction (HCC) 05/27/2022   Primary osteoarthritis of left hip 02/18/2022   Osteoarthritis of left hip 02/12/2022   Healthcare maintenance 06/25/2019   Cough 04/26/2018   Type 2 diabetes mellitus, controlled (HCC) 12/05/2015   Hearing problem 07/06/2012   Hyperlipidemia 11/20/2008   ERECTILE DYSFUNCTION, 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   OSTEOARTHRITIS, KNEE, RIGHT 11/20/2008   ALLERGIC RHINITIS, SEASONAL 10/19/2008   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/2008     Discharge Diagnoses:  Patient Active Problem List   Diagnosis Date Noted   Hernia of abdominal cavity 08/24/2023   Ventral hernia 08/23/2023   Hematuria 12/18/2022   Abdominal wound dehiscence 06/19/2022   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   Renal lesion 06/11/2022   Hepatic lesion 06/11/2022   Hypernatremia 05/30/2022   Generalized abdominal pain 05/27/2022   S/P small bowel resection 05/27/2022   Small bowel obstruction (HCC) 05/27/2022   Primary osteoarthritis of left hip 02/18/2022   Osteoarthritis of left hip 02/12/2022   Healthcare maintenance 06/25/2019   Cough 04/26/2018   Type 2 diabetes mellitus, controlled (HCC) 12/05/2015   Hearing problem 07/06/2012   Hyperlipidemia 11/20/2008   ERECTILE DYSFUNCTION, 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   OSTEOARTHRITIS, KNEE, RIGHT  11/20/2008   ALLERGIC RHINITIS, SEASONAL 10/19/2008   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/2008    Operations: Procedure(s): OPEN INCISIONAL HERNIA REPAIR WITH MESH, BILATERAL POSTERIOR RECTUS MYOFASCIAL RELEASE, BILATERAL TRANSVERSUS ABDOMINS MYOFASCIAL RELEASE, BILATERAL TAP BLOCK  Admission Condition: good  Discharged Condition: good  Indication for Admission: Ventral hernia  Hospital Course: Open ventral hernia repair as above  Consults: None  Significant Diagnostic Studies: None  Treatments: surgery: as above  Disposition: Home  Patient Instructions:  Allergies as of 08/25/2023       Reactions   Lisinopril    Angioedema         Medication List     TAKE these medications    atorvastatin 40 MG tablet Commonly known as: LIPITOR Take 1 tablet (40 mg total) by mouth daily.   fluticasone 50 MCG/ACT nasal spray Commonly known as: FLONASE Place 2 sprays into both nostrils daily.   hydrochlorothiazide 12.5 MG tablet Commonly known as: HYDRODIURIL Take 1 tablet (12.5 mg total) by mouth daily.   methocarbamol 750 MG tablet Commonly known as: Robaxin-750 Take 1 tablet (750 mg total) by mouth every 6 (six) hours as needed for muscle spasms.   oxyCODONE-acetaminophen 5-325 MG tablet Commonly known as: Percocet Take 1 tablet by mouth every 4 (four) hours as needed for severe pain (pain score 7-10).   tamsulosin 0.4 MG Caps capsule Commonly known as: FLOMAX Take 1 capsule (0.4 mg total) by mouth every evening.        Activity: no heavy lifting for 4 weeks Diet: regular diet Wound Care: keep wound clean and dry  Follow-up:  With Dr. Dossie Der  Signed: Hyman Hopes Tyheem Boughner [facility] [place]  08/25/2023, 8:16 AM

## 2023-08-25 NOTE — Plan of Care (Signed)

## 2023-08-25 NOTE — Plan of Care (Signed)
  Problem: Nutrition: Goal: Adequate nutrition will be maintained Outcome: Progressing   Problem: Pain Managment: Goal: General experience of comfort will improve and/or be controlled Outcome: Progressing

## 2023-08-25 NOTE — Evaluation (Addendum)
 Occupational Therapy Evaluation Patient Details Name: Edward Newton MRN: [id number] DOB: 12-Jan-1941 Today's Date: 08/25/2023   History of Present Illness   83 yo male s/p hernia repair with mesh, myofascial release 08/23/23. hx of SB resection 2023, OA, DM     Clinical Impressions PTA, patient was completely independent with all aspects of A/IADL's and mobility including driving. Patient lives with wife and has had this procedure prior and was able to teach back 3/3 precautions for no lifting, bending and to maintain incision intact with abdominal binder. Patient now ambulating in room without AD, steady gain with no LOB for am self care and toileting routine. Patient able to demonstrate reacher use for LE clothing management and has device for home use. Patient also able to figure 4 and donned B shoes independently. OT completed all education and training and recommends no further Acute or follow up OT needs. OT signing off from services.     If plan is discharge home, recommend the following:   A little help with walking and/or transfers;A little help with bathing/dressing/bathroom;Assistance with cooking/housework;Assist for transportation;Help with stairs or ramp for entrance     Functional Status Assessment   Patient has had a recent decline in their functional status and demonstrates the ability to make significant improvements in function in a reasonable and predictable amount of time.     Equipment Recommendations   None recommended by OT      Precautions/Restrictions   Precautions Precautions: Fall Restrictions Weight Bearing Restrictions Per Provider Order: No     Mobility Bed Mobility Overal bed mobility: Independent                  Transfers Overall transfer level: Independent Equipment used: None               General transfer comment: no longer using RW in room or hallway, has SPC if needed outdoors      Balance Overall balance  assessment: Modified Independent         Standing balance support: No upper extremity supported Standing balance-Leahy Scale: Good                             ADL either performed or assessed with clinical judgement   ADL Overall ADL's : Needs assistance/impaired Eating/Feeding: Independent   Grooming: Wash/dry hands;Wash/dry face;Oral care;Applying deodorant;Standing;Modified independent   Upper Body Bathing: Independent;Standing;Sitting   Lower Body Bathing: Sitting/lateral leans;Sit to/from stand Lower Body Bathing Details (indicate cue type and reason): figure 4 techniques for lower legs Upper Body Dressing : Modified independent;Sitting (excluding abdominal binder with min imal assistance, wife to assist as per patient and familiar from previous hernia repair)   Lower Body Dressing: Modified independent;Sitting/lateral leans;Sit to/from stand (use of reacher for lower leg garment threading as well as figure 4)   Toilet Transfer: Stage manager and Hygiene: Independent   Retail buyer:  (patient will sponge bathe the next 4 weeks to maintain c/d/i incision as per MD)   Functional mobility during ADLs: Independent General ADL Comments: tolerated all AM education and training with self care without issues     Vision Baseline Vision/History: 1 Wears glasses Ability to See in Adequate Light: 0 Adequate Patient Visual Report: No change from baseline Vision Assessment?: Yes Eye Alignment: Within Functional Limits     Perception Perception: Within Functional Limits       Praxis Praxis: [facility]  Pertinent Vitals/Pain Pain Assessment Pain Assessment: No/denies pain Pain Intervention(s): Other (comment) (abdominal binder in place)     Extremity/Trunk Assessment Upper Extremity Assessment Upper Extremity Assessment: Overall WFL for tasks assessed   Lower Extremity Assessment Lower Extremity Assessment: Overall WFL  for tasks assessed   Cervical / Trunk Assessment Cervical / Trunk Assessment: Normal   Communication Communication Communication: No apparent difficulties   Cognition Arousal: Alert Behavior During Therapy: WFL for tasks assessed/performed                                 Following commands: Intact       Cueing  General Comments   Cueing Techniques: Verbal cues  drain and abdominal binder in place           Home Living Family/patient expects to be discharged to:: Private residence Living Arrangements: Spouse/significant other Available Help at Discharge: Family Type of Home: House Home Access: Level entry     Home Layout: Bed/bath upstairs     Bathroom Shower/Tub: IT trainer: Standard Bathroom Accessibility: Yes How Accessible: Accessible via walker Home Equipment: Cane - single point;Grab bars - tub/shower          Prior Functioning/Environment Prior Level of Function : Independent/Modified Independent             Mobility Comments:  (independent) ADLs Comments: independent    OT Problem List: Decreased activity tolerance;Decreased knowledge of use of DME or AE        OT Goals(Current goals can be found in the care plan section)   Acute Rehab OT Goals Patient Stated Goal: to go home OT Goal Formulation: With patient Time For Goal Achievement: 08/25/23 Potential to Achieve Goals: Good   AM-PAC OT "6 Clicks" Daily Activity     Outcome Measure Help from another person eating meals?: None Help from another person taking care of personal grooming?: None Help from another person toileting, which includes using toliet, bedpan, or urinal?: None Help from another person bathing (including washing, rinsing, drying)?: None Help from another person to put on and taking off regular upper body clothing?: None Help from another person to put on and taking off regular lower body clothing?: None 6 Click Score:  24   End of Session Equipment Utilized During Treatment: Gait belt Building control surveyor) Nurse Communication: Mobility status  Activity Tolerance: Patient tolerated treatment well;No increased pain Patient left: in bed;with call bell/phone within reach  OT Visit Diagnosis: Unsteadiness on feet (R26.81)                Time: 0930-1000 OT Time Calculation (min): 30 min Charges:  OT General Charges $OT Visit: 1 Visit OT Evaluation $OT Eval Low Complexity: 1 Low OT Treatments $Self Care/Home Management : 8-22 mins  Hadasah Brugger OT/L Acute Rehabilitation Department  [phone] 08/25/2023, 10:33 AM

## 2023-09-10 ENCOUNTER — Ambulatory Visit (INDEPENDENT_AMBULATORY_CARE_PROVIDER_SITE_OTHER): Admitting: Family Medicine

## 2023-09-10 ENCOUNTER — Encounter: Payer: Self-pay | Admitting: Family Medicine

## 2023-09-10 VITALS — BP 133/74 | HR 82 | Ht 70.0 in | Wt 159.8 lb

## 2023-09-10 DIAGNOSIS — E119 Type 2 diabetes mellitus without complications: Secondary | ICD-10-CM | POA: Diagnosis not present

## 2023-09-10 MED ORDER — METFORMIN HCL ER 500 MG PO TB24: 500.0000 mg | ORAL_TABLET | Freq: Every day | ORAL | 0 refills | Status: DC

## 2023-09-10 MED ORDER — METFORMIN HCL ER 750 MG PO TB24
750.0000 mg | ORAL_TABLET | Freq: Every day | ORAL | 0 refills | Status: DC
Start: 2023-09-10 — End: 2023-09-10

## 2023-09-10 MED ORDER — ONETOUCH VERIO W/DEVICE KIT: PACK | 0 refills | Status: DC

## 2023-09-10 MED ORDER — ONETOUCH DELICA LANCETS 33G MISC: 3 refills | Status: AC

## 2023-09-10 MED ORDER — ONETOUCH VERIO VI STRP: ORAL_STRIP | 3 refills | Status: DC

## 2023-09-10 MED ORDER — ONETOUCH DELICA LANCING DEV MISC: 0 refills | Status: DC

## 2023-09-10 NOTE — Progress Notes (Signed)
    SUBJECTIVE:   CHIEF COMPLAINT / HPI: T2DM f/u  Last A1c 8.2. Not currently taking any medications. Has been years since checking sugars at home. Had surgery ventral hernia repair.  No increased thirst or urination.  Stomach still sore from surgery.    PERTINENT  PMH / PSH: Secondary HTN, Hx of SBO, T2DM  OBJECTIVE:   BP 133/74   Pulse 82   Ht 5\' 10"  (1.778 m)   Wt 159 lb 12.8 oz (72.5 kg)   SpO2 95%   BMI 22.93 kg/m   General: NAD, well appearing Neuro: A&O Respiratory: normal WOB on RA Extremities: Moving all 4 extremities equally   ASSESSMENT/PLAN:   Assessment & Plan Controlled type 2 diabetes mellitus without complication, without long-term current use of insulin (HCC) A1c 8.2 rise from 7.2. Currently diet controlled. Discussed importance of make sure diabetes does not further worsen. Patient agreeable to plan: -Metformin XR 500mg  daily -Glucose testing supplies sent to pharmacy -Follow-up 3 months -Urine microalbumin to creatinine ratio  Return in about 2 months (around 11/10/2023) for DM F/u, A1c.  Celine Mans, MD [facility]

## 2023-09-10 NOTE — Patient Instructions (Signed)
 It was great to see you! Thank you for allowing me to participate in your care!  Our plans for today:  - Please start taking Metformin 500mg  once daily. I have sent this to your pharmacy. - I have also sent supplies to check your sugar with. - Your goal sugar in the morning when you wake up is less than 120.  - I will see you again in 3 months.   Please arrive 15 minutes PRIOR to your next scheduled appointment time! If you do not, this affects OTHER patients' care.  Take care and seek immediate care sooner if you develop any concerns.   Celine Mans, MD, PGY-2 [facility] 9:29 AM 09/10/2023  [facility]

## 2023-09-10 NOTE — Assessment & Plan Note (Signed)
 A1c 8.2 rise from 7.2. Currently diet controlled. Discussed importance of make sure diabetes does not further worsen. Patient agreeable to plan: -Metformin XR 500mg  daily -Glucose testing supplies sent to pharmacy -Follow-up 3 months -Urine microalbumin to creatinine ratio

## 2023-09-11 LAB — MICROALBUMIN / CREATININE URINE RATIO
Creatinine, Urine: 89.2 mg/dL
Microalb/Creat Ratio: 5 mg/g{creat} (ref 0–29)
Microalbumin, Urine: 4.8 ug/mL

## 2023-09-12 ENCOUNTER — Encounter: Payer: Self-pay | Admitting: Family Medicine

## 2023-09-16 ENCOUNTER — Ambulatory Visit (INDEPENDENT_AMBULATORY_CARE_PROVIDER_SITE_OTHER): Payer: Medicare HMO

## 2023-09-16 ENCOUNTER — Telehealth: Payer: Self-pay | Admitting: Family Medicine

## 2023-09-16 VITALS — Ht 70.0 in | Wt 160.0 lb

## 2023-09-16 DIAGNOSIS — Z0000 Encounter for general adult medical examination without abnormal findings: Secondary | ICD-10-CM

## 2023-09-16 NOTE — Telephone Encounter (Signed)
 Patient needs foot exam at next visit.
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
## 2023-09-16 NOTE — Patient Instructions (Addendum)
 Mr. Edward Newton , Thank you for taking time to come for your Medicare Wellness Visit. I appreciate your ongoing commitment to your health goals. Please review the following plan we discussed and let me know if I can assist you in the future.   Referrals/Orders/Follow-Ups/Clinician Recommendations: Keep maintaining your health by keeping your appointments with Dr. Evette Georges and any specialists that you may see.  Call us if you need anything.  Have a great year!!!!  This is a list of the screening recommended for you and due dates:  Health Maintenance  Topic Date Due   Zoster (Shingles) Vaccine (1 of 2) Never done   Complete foot exam   06/21/2020   COVID-19 Vaccine (4 - 2024-25 season) 02/14/2023   Flu Shot  01/14/2024   Hemoglobin A1C  02/17/2024   Eye exam for diabetics  02/24/2024   Yearly kidney function blood test for diabetes  08/24/2024   Yearly kidney health urinalysis for diabetes  09/09/2024   Medicare Annual Wellness Visit  09/15/2024   DTaP/Tdap/Td vaccine (3 - Td or Tdap) 07/10/2030   Pneumonia Vaccine  Completed   HPV Vaccine  Aged Out   Hepatitis C Screening  Discontinued    Advanced directives: (Declined) Advance directive discussed with you today. Even though you declined this today, please call our office should you change your mind, and we can give you the proper paperwork for you to fill out.  Next Medicare Annual Wellness Visit scheduled for next year: Yes  Preventive Care attachment FALL PREVENTION attachment

## 2023-11-03 DIAGNOSIS — R3914 Feeling of incomplete bladder emptying: Secondary | ICD-10-CM | POA: Diagnosis not present

## 2023-12-01 ENCOUNTER — Telehealth: Payer: Self-pay

## 2023-12-01 MED ORDER — METFORMIN HCL ER 500 MG PO TB24: 500.0000 mg | ORAL_TABLET | Freq: Every day | ORAL | 3 refills | Status: AC

## 2023-12-01 NOTE — Telephone Encounter (Signed)
 Patient calls nurse line requesting a refill on Metformin .   He reports his fasting sugars in the AM have been ~85-90. He denies any hypoglycemia symptoms.   He reports after he eats breakfast his sugars go up to normal range. He was unable to give me any numerical ranges.   Patient scheduled for DM FU with PCP on 6/25.  Patient advised to keep a log of glucose readings.   Precautions dicussed.   Will send to PCP for refill.

## 2023-12-01 NOTE — Telephone Encounter (Signed)
 Metformin  refilled. Will need A1c rechecked at next OV.

## 2023-12-06 ENCOUNTER — Other Ambulatory Visit: Payer: Self-pay

## 2023-12-06 DIAGNOSIS — E119 Type 2 diabetes mellitus without complications: Secondary | ICD-10-CM

## 2023-12-06 MED ORDER — ONETOUCH VERIO VI STRP: ORAL_STRIP | 3 refills | Status: DC

## 2023-12-08 ENCOUNTER — Ambulatory Visit (INDEPENDENT_AMBULATORY_CARE_PROVIDER_SITE_OTHER): Admitting: Family Medicine

## 2023-12-08 ENCOUNTER — Encounter: Payer: Self-pay | Admitting: Family Medicine

## 2023-12-08 VITALS — BP 140/70 | HR 82 | Ht 70.0 in | Wt 152.0 lb

## 2023-12-08 DIAGNOSIS — E279 Disorder of adrenal gland, unspecified: Secondary | ICD-10-CM | POA: Diagnosis not present

## 2023-12-08 DIAGNOSIS — R310 Gross hematuria: Secondary | ICD-10-CM | POA: Diagnosis not present

## 2023-12-08 DIAGNOSIS — K869 Disease of pancreas, unspecified: Secondary | ICD-10-CM

## 2023-12-08 DIAGNOSIS — E119 Type 2 diabetes mellitus without complications: Secondary | ICD-10-CM | POA: Diagnosis not present

## 2023-12-08 LAB — POCT GLYCOSYLATED HEMOGLOBIN (HGB A1C): HbA1c, POC (controlled diabetic range): 6.3 % (ref 0.0–7.0)

## 2023-12-08 MED ORDER — ONETOUCH VERIO VI STRP: ORAL_STRIP | 3 refills | Status: DC

## 2023-12-08 NOTE — Progress Notes (Signed)
    SUBJECTIVE:   CHIEF COMPLAINT / HPI:   T2DM, HLD Previously A1c 8.2 in March 2025. He was placed on metformin  500 mg XR daily at that time and sent with glucose monitoring supplies. UACR was normal at that time. He noticed a low of 78 last Friday and a high of 257 a while back after eating stewed corn. Most values seem to be around 100-130s based on monitor review. He is unsure if he is taking Lipitor, but he thinks he is.  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 Recommended repeat in 1 year. No abdominal pain, nausea, vomiting, bloating, constipation, or diarrhea.  Enlarged prostate on CTAP, gross hematuria Seen on CTAP 12/2022. [facility] was [facility] at that time. This has resolved. He has followed up with urology who states no further follow up needed. He takes flomax  for the enlarged prostate.  PERTINENT  PMH / PSH: HTN, SBO, hernia repair, OA  OBJECTIVE:   BP (!) 140/70   Pulse 82   Ht 5' 10 (1.778 m)   Wt 152 lb (68.9 kg)   SpO2 96%   BMI 21.81 kg/m   General: Alert and oriented, in NAD Skin: Warm, dry, and intact without lesions HEENT: NCAT, EOM grossly normal, midline nasal septum Cardiac: RRR, no m/r/g appreciated Respiratory: CTAB, breathing and speaking comfortably on RA Abdominal: Soft, nontender, nondistended, normoactive bowel sounds Extremities: Moves all extremities grossly equally Neurological: No gross focal deficit Psychiatric: Appropriate mood and affect  ASSESSMENT/PLAN:   Assessment & Plan Controlled type 2 diabetes mellitus without complication, without long-term current use of insulin  (HCC) A1c today decreased to 6.3 from 8.2 after lifestyle modifications and metformin  500 mg daily. Congratulated on hard work. Continue current management.  Lipid panel updated today.  Follow up 6 months. Pancreatic lesion As seen on MRI abdomen 06/2022 as above. Discussed repeating today for  surveillance. Reassuringly without symptoms. MRI ordered. Gross hematuria Unable to see urology results.  Per patient, no further follow-up needed.  He will continue to follow-up with them yearly. 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   Stuart Redo, MD [facility]

## 2023-12-08 NOTE — Assessment & Plan Note (Signed)
 Unable to see urology results.  Per patient, no further follow-up needed.  He will continue to follow-up with them yearly.

## 2023-12-08 NOTE — Assessment & Plan Note (Signed)
 As seen on MRI abdomen 06/2022 as above. Discussed repeating today for surveillance. Reassuringly without symptoms. MRI ordered.

## 2023-12-08 NOTE — Assessment & Plan Note (Signed)
 Presumed to be benign based on most recent MRI findings in January 2024.

## 2023-12-08 NOTE — Assessment & Plan Note (Addendum)
 A1c today decreased to 6.3 from 8.2 after lifestyle modifications and metformin  500 mg daily. Congratulated on hard work. Continue current management.  Lipid panel updated today.  Follow up 6 months.

## 2023-12-08 NOTE — Patient Instructions (Signed)
 Keep up the great work! Come back in 6 months. You do not have to keep checking your sugars unless you feel you need to.  I have ordered an MRI abdomen to see the pancreas lesion. We will call you to set this up.

## 2023-12-09 ENCOUNTER — Ambulatory Visit: Payer: Self-pay | Admitting: Family Medicine

## 2023-12-09 LAB — LIPID PANEL
Chol/HDL Ratio: 2.5 ratio (ref 0.0–5.0)
Cholesterol, Total: 128 mg/dL (ref 100–199)
HDL: 51 mg/dL (ref >39)
LDL Chol Calc (NIH): 66 mg/dL (ref 0–99)
Triglycerides: 50 mg/dL (ref 0–149)
VLDL Cholesterol Cal: 11 mg/dL (ref 5–40)

## 2023-12-23 ENCOUNTER — Telehealth: Payer: Self-pay | Admitting: Family Medicine

## 2023-12-23 DIAGNOSIS — E119 Type 2 diabetes mellitus without complications: Secondary | ICD-10-CM

## 2023-12-23 MED ORDER — ONETOUCH VERIO FLEX SYSTEM W/DEVICE KIT: PACK | 0 refills | Status: AC

## 2023-12-23 MED ORDER — ONETOUCH VERIO VI STRP: ORAL_STRIP | 3 refills | Status: DC

## 2023-12-23 NOTE — Telephone Encounter (Signed)
 Sent new prescription for glucometer and strips to be used as needed. Reassuringly controlled T2DM with metformin  and lifestyle changes; given this and age, no need for strict monitoring.

## 2023-12-29 ENCOUNTER — Other Ambulatory Visit

## 2024-01-08 ENCOUNTER — Other Ambulatory Visit: Payer: Self-pay

## 2024-01-08 ENCOUNTER — Ambulatory Visit
Admission: RE | Admit: 2024-01-08 | Discharge: 2024-01-08 | Disposition: A | Discharge status: Home / Self Care | Source: Ambulatory Visit | Attending: Family Medicine | Admitting: Family Medicine

## 2024-01-08 ENCOUNTER — Encounter (HOSPITAL_COMMUNITY): Payer: Self-pay

## 2024-01-08 ENCOUNTER — Telehealth: Payer: Self-pay | Admitting: Family Medicine

## 2024-01-08 ENCOUNTER — Inpatient Hospital Stay (HOSPITAL_COMMUNITY)
Admission: EM | Admit: 2024-01-08 | Discharge: 2024-01-11 | DRG: 388 | Disposition: A | Discharge status: Home / Self Care | Attending: Family Medicine | Admitting: Family Medicine

## 2024-01-08 DIAGNOSIS — Z87891 Personal history of nicotine dependence: Secondary | ICD-10-CM

## 2024-01-08 DIAGNOSIS — Z6821 Body mass index (BMI) 21.0-21.9, adult: Secondary | ICD-10-CM

## 2024-01-08 DIAGNOSIS — N400 Enlarged prostate without lower urinary tract symptoms: Secondary | ICD-10-CM | POA: Diagnosis present

## 2024-01-08 DIAGNOSIS — I159 Secondary hypertension, unspecified: Secondary | ICD-10-CM | POA: Diagnosis present

## 2024-01-08 DIAGNOSIS — K862 Cyst of pancreas: Secondary | ICD-10-CM | POA: Diagnosis not present

## 2024-01-08 DIAGNOSIS — R000 Tachycardia, unspecified: Secondary | ICD-10-CM | POA: Diagnosis not present

## 2024-01-08 DIAGNOSIS — K565 Intestinal adhesions [bands], unspecified as to partial versus complete obstruction: Principal | ICD-10-CM

## 2024-01-08 DIAGNOSIS — M19012 Primary osteoarthritis, left shoulder: Secondary | ICD-10-CM | POA: Diagnosis not present

## 2024-01-08 DIAGNOSIS — I10 Essential (primary) hypertension: Secondary | ICD-10-CM | POA: Diagnosis not present

## 2024-01-08 DIAGNOSIS — K869 Disease of pancreas, unspecified: Secondary | ICD-10-CM

## 2024-01-08 DIAGNOSIS — R188 Other ascites: Secondary | ICD-10-CM | POA: Diagnosis not present

## 2024-01-08 DIAGNOSIS — N401 Enlarged prostate with lower urinary tract symptoms: Secondary | ICD-10-CM

## 2024-01-08 DIAGNOSIS — Z9079 Acquired absence of other genital organ(s): Secondary | ICD-10-CM

## 2024-01-08 DIAGNOSIS — K6389 Other specified diseases of intestine: Secondary | ICD-10-CM | POA: Diagnosis not present

## 2024-01-08 DIAGNOSIS — E119 Type 2 diabetes mellitus without complications: Secondary | ICD-10-CM

## 2024-01-08 DIAGNOSIS — K56609 Unspecified intestinal obstruction, unspecified as to partial versus complete obstruction: Secondary | ICD-10-CM | POA: Diagnosis present

## 2024-01-08 DIAGNOSIS — Z888 Allergy status to other drugs, medicaments and biological substances status: Secondary | ICD-10-CM | POA: Diagnosis not present

## 2024-01-08 DIAGNOSIS — Z9049 Acquired absence of other specified parts of digestive tract: Secondary | ICD-10-CM | POA: Diagnosis not present

## 2024-01-08 DIAGNOSIS — Z79899 Other long term (current) drug therapy: Secondary | ICD-10-CM | POA: Diagnosis not present

## 2024-01-08 DIAGNOSIS — E876 Hypokalemia: Secondary | ICD-10-CM | POA: Diagnosis present

## 2024-01-08 DIAGNOSIS — K5651 Intestinal adhesions [bands], with partial obstruction: Principal | ICD-10-CM | POA: Diagnosis present

## 2024-01-08 DIAGNOSIS — K5669 Other partial intestinal obstruction: Secondary | ICD-10-CM | POA: Diagnosis not present

## 2024-01-08 DIAGNOSIS — E1122 Type 2 diabetes mellitus with diabetic chronic kidney disease: Secondary | ICD-10-CM | POA: Diagnosis not present

## 2024-01-08 DIAGNOSIS — N1831 Chronic kidney disease, stage 3a: Secondary | ICD-10-CM | POA: Diagnosis present

## 2024-01-08 DIAGNOSIS — E782 Mixed hyperlipidemia: Secondary | ICD-10-CM

## 2024-01-08 DIAGNOSIS — Z8249 Family history of ischemic heart disease and other diseases of the circulatory system: Secondary | ICD-10-CM | POA: Diagnosis not present

## 2024-01-08 DIAGNOSIS — Z7984 Long term (current) use of oral hypoglycemic drugs: Secondary | ICD-10-CM

## 2024-01-08 DIAGNOSIS — E785 Hyperlipidemia, unspecified: Secondary | ICD-10-CM | POA: Diagnosis not present

## 2024-01-08 DIAGNOSIS — Z923 Personal history of irradiation: Secondary | ICD-10-CM | POA: Diagnosis not present

## 2024-01-08 DIAGNOSIS — E43 Unspecified severe protein-calorie malnutrition: Secondary | ICD-10-CM | POA: Diagnosis present

## 2024-01-08 DIAGNOSIS — R109 Unspecified abdominal pain: Secondary | ICD-10-CM | POA: Diagnosis present

## 2024-01-08 HISTORY — DX: Hypokalemia: E87.6

## 2024-01-08 LAB — CBC
HCT: 44.6 % (ref 39.0–52.0)
Hemoglobin: 14.7 g/dL (ref 13.0–17.0)
MCH: 27.7 pg (ref 26.0–34.0)
MCHC: 33 g/dL (ref 30.0–36.0)
MCV: 84 fL (ref 80.0–100.0)
Platelets: 194 K/uL (ref 150–400)
RBC: 5.31 MIL/uL (ref 4.22–5.81)
RDW: 16.1 % — ABNORMAL HIGH (ref 11.5–15.5)
WBC: 6.5 K/uL (ref 4.0–10.5)
nRBC: 0 % (ref 0.0–0.2)

## 2024-01-08 LAB — CK: Total CK: 82 U/L (ref 49–397)

## 2024-01-08 LAB — COMPREHENSIVE METABOLIC PANEL WITH GFR
ALT: 15 U/L (ref 0–44)
AST: 19 U/L (ref 15–41)
Albumin: 3.9 g/dL (ref 3.5–5.0)
Alkaline Phosphatase: 61 U/L (ref 38–126)
Anion gap: 12 (ref 5–15)
BUN: 20 mg/dL (ref 8–23)
CO2: 24 mmol/L (ref 22–32)
Calcium: 9.6 mg/dL (ref 8.9–10.3)
Chloride: 108 mmol/L (ref 98–111)
Creatinine, Ser: 1.09 mg/dL (ref 0.61–1.24)
GFR, Estimated: >60 mL/min (ref >60)
Glucose, Bld: 150 mg/dL — ABNORMAL HIGH (ref 70–99)
Potassium: 3.4 mmol/L — ABNORMAL LOW (ref 3.5–5.1)
Sodium: 144 mmol/L (ref 135–145)
Total Bilirubin: 1.7 mg/dL — ABNORMAL HIGH (ref 0.0–1.2)
Total Protein: 7.5 g/dL (ref 6.5–8.1)

## 2024-01-08 LAB — BASIC METABOLIC PANEL WITH GFR
Anion gap: 10 (ref 5–15)
BUN: 20 mg/dL (ref 8–23)
CO2: 26 mmol/L (ref 22–32)
Calcium: 8.8 mg/dL — ABNORMAL LOW (ref 8.9–10.3)
Chloride: 108 mmol/L (ref 98–111)
Creatinine, Ser: 1.35 mg/dL — ABNORMAL HIGH (ref 0.61–1.24)
GFR, Estimated: 53 mL/min — ABNORMAL LOW (ref >60)
Glucose, Bld: 143 mg/dL — ABNORMAL HIGH (ref 70–99)
Potassium: 4.2 mmol/L (ref 3.5–5.1)
Sodium: 144 mmol/L (ref 135–145)

## 2024-01-08 LAB — IRON AND TIBC
Iron: 38 ug/dL — ABNORMAL LOW (ref 45–182)
Saturation Ratios: 14 % — ABNORMAL LOW (ref 17.9–39.5)
TIBC: 266 ug/dL (ref 250–450)
UIBC: 228 ug/dL

## 2024-01-08 LAB — MAGNESIUM: Magnesium: 2.2 mg/dL (ref 1.7–2.4)

## 2024-01-08 LAB — FERRITIN: Ferritin: 32 ng/mL (ref 24–336)

## 2024-01-08 LAB — RETICULOCYTES
Immature Retic Fract: 11.2 % (ref 2.3–15.9)
RBC.: 5.28 MIL/uL (ref 4.22–5.81)
Retic Count, Absolute: 58.6 K/uL (ref 19.0–186.0)
Retic Ct Pct: 1.1 % (ref 0.4–3.1)

## 2024-01-08 LAB — HEMOGLOBIN A1C
Hgb A1c MFr Bld: 6.5 % — ABNORMAL HIGH (ref 4.8–5.6)
Mean Plasma Glucose: 139.85 mg/dL

## 2024-01-08 LAB — GLUCOSE, CAPILLARY
Glucose-Capillary: 109 mg/dL — ABNORMAL HIGH (ref 70–99)
Glucose-Capillary: 132 mg/dL — ABNORMAL HIGH (ref 70–99)

## 2024-01-08 LAB — LIPASE, BLOOD: Lipase: 22 U/L (ref 11–51)

## 2024-01-08 LAB — PHOSPHORUS: Phosphorus: 3.8 mg/dL (ref 2.5–4.6)

## 2024-01-08 LAB — CBG MONITORING, ED: Glucose-Capillary: 119 mg/dL — ABNORMAL HIGH (ref 70–99)

## 2024-01-08 MED ORDER — LACTATED RINGERS IV SOLN: INTRAVENOUS | Status: DC

## 2024-01-08 MED ORDER — POTASSIUM CHLORIDE 10 MEQ/100ML IV SOLN
10.0000 meq | INTRAVENOUS | Status: AC
  Administered 2024-01-08 (×2): 10 meq via INTRAVENOUS
  Filled 2024-01-08 (×2): qty 100

## 2024-01-08 MED ORDER — LACTATED RINGERS IV BOLUS
1000.0000 mL | Freq: Once | INTRAVENOUS | Status: AC
  Administered 2024-01-08: 1000 mL via INTRAVENOUS

## 2024-01-08 MED ORDER — ONDANSETRON HCL 4 MG/2ML IJ SOLN: 4.0000 mg | Freq: Four times a day (QID) | INTRAMUSCULAR | Status: DC | PRN

## 2024-01-08 MED ORDER — HYDROCODONE-ACETAMINOPHEN 5-325 MG PO TABS: 1.0000 | ORAL_TABLET | ORAL | Status: DC | PRN

## 2024-01-08 MED ORDER — INSULIN ASPART 100 UNIT/ML IJ SOLN
0.0000 [IU] | INTRAMUSCULAR | Status: DC
  Administered 2024-01-09 – 2024-01-10 (×3): 1 [IU] via SUBCUTANEOUS
  Administered 2024-01-11: 2 [IU] via SUBCUTANEOUS
  Filled 2024-01-08: qty 0.09

## 2024-01-08 MED ORDER — ACETAMINOPHEN 650 MG RE SUPP: 650.0000 mg | Freq: Four times a day (QID) | RECTAL | Status: DC | PRN

## 2024-01-08 MED ORDER — ONDANSETRON HCL 4 MG PO TABS: 4.0000 mg | ORAL_TABLET | Freq: Four times a day (QID) | ORAL | Status: DC | PRN

## 2024-01-08 MED ORDER — MORPHINE SULFATE (PF) 4 MG/ML IV SOLN
4.0000 mg | Freq: Once | INTRAVENOUS | Status: AC
  Administered 2024-01-08: 4 mg via INTRAVENOUS
  Filled 2024-01-08: qty 1

## 2024-01-08 MED ORDER — GADOPICLENOL 0.5 MMOL/ML IV SOLN
7.0000 mL | Freq: Once | INTRAVENOUS | Status: AC | PRN
  Administered 2024-01-08: 7 mL via INTRAVENOUS

## 2024-01-08 MED ORDER — HYDROMORPHONE HCL 1 MG/ML IJ SOLN
0.5000 mg | INTRAMUSCULAR | Status: DC | PRN
  Administered 2024-01-08 – 2024-01-09 (×5): 1 mg via INTRAVENOUS
  Filled 2024-01-08 (×5): qty 1

## 2024-01-08 MED ORDER — ONDANSETRON HCL 4 MG/2ML IJ SOLN
4.0000 mg | Freq: Once | INTRAMUSCULAR | Status: AC
  Administered 2024-01-08: 4 mg via INTRAVENOUS
  Filled 2024-01-08: qty 2

## 2024-01-08 MED ORDER — SODIUM CHLORIDE 0.9 % IV SOLN: INTRAVENOUS | Status: AC

## 2024-01-08 MED ORDER — ACETAMINOPHEN 325 MG PO TABS: 650.0000 mg | ORAL_TABLET | Freq: Four times a day (QID) | ORAL | Status: DC | PRN

## 2024-01-08 NOTE — Assessment & Plan Note (Addendum)
 Resume blood pressure medications when able to tolerate Currently BP in 120s over 60s allow permissive hypertension for tonight

## 2024-01-08 NOTE — ED Triage Notes (Signed)
 Pthas c/o abdominal pain and nausea since last night.

## 2024-01-08 NOTE — Assessment & Plan Note (Signed)
Resume Lipitor when able to tolerate

## 2024-01-08 NOTE — Assessment & Plan Note (Signed)
 Resume BPH when able to tolerate
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
## 2024-01-08 NOTE — Assessment & Plan Note (Signed)
 Management as per surgery Small bowel obstruction likely secondary to adhesions. The appearance of the small bowel mesentery is likely the site of the previous small bowel resection. No indication for NG tube unless the patient begins to vomit or becomes more distended. No indications for emergent surgical intervention   Likely cause  adhesions,    - admit for conservative management   - KUB in AM - appreciate General surgery consult.

## 2024-01-08 NOTE — Assessment & Plan Note (Signed)
 Continue to follow-up as an outpatient

## 2024-01-08 NOTE — Assessment & Plan Note (Signed)
>>  ASSESSMENT AND PLAN FOR SBO (SMALL BOWEL OBSTRUCTION) (HCC) WRITTEN ON 01/08/2024  4:57 PM BY DOUTOVA, ANASTASSIA, MD  Management as per surgery Small bowel obstruction likely secondary to adhesions. The appearance of the small bowel mesentery is likely the site of the previous small bowel resection. No indication for NG tube unless the patient begins to vomit or becomes more distended. No indications for emergent surgical intervention   Likely cause  adhesions,    - admit for conservative management   - KUB in AM - appreciate General surgery consult.

## 2024-01-08 NOTE — Assessment & Plan Note (Signed)
-   will replace electrolytes and repeat  check Mg, phos and Ca level and replace as needed Monitor on telemetry   Lab Results  Component Value Date   K 3.4 (L) 01/08/2024     Lab Results  Component Value Date   CREATININE 1.09 01/08/2024   Lab Results  Component Value Date   MG 2.2 01/08/2024   Lab Results  Component Value Date   CALCIUM  9.6 01/08/2024   PHOS 3.8 01/08/2024

## 2024-01-08 NOTE — Assessment & Plan Note (Signed)
 -  Order Sensitive  SSI     -  check TSH and HgA1C  - Hold by mouth medications*

## 2024-01-08 NOTE — Subjective & Objective (Addendum)
 Pt was getting his scheduled MRi that found SBO  Patient has had abd pain all night, feeling nauseous not vomiting Last BM was this AM Now not passing gas No fever no chill  No CP no SOB

## 2024-01-08 NOTE — Telephone Encounter (Signed)
 After Hours/Emergency Line Call  Received an after hours page to call [phone]; radiology reading room Patient: Edward Newton Caller: Radiology technician, the patient (self) Confirmed name & DOB of patient with caller.  SUBJECTIVE: Edward Newton is a 83 y.o. male with pertinent past medical history of pancreatic lesion and hepatic hemangioma who underwent MRI abdomen this AM for surveillance of pancreatic lesion:  Critical result: Small-bowel obstruction, with possible transition point in the left lower quadrant which may be due to an internal hernia. Recommend abdomen pelvis CT for further evaluation.  Called patient, discussed results.  Reports hard BM x1 this AM, non-bloody.  BM 3 days prior to that.  Reports abdominal pain since yesterday, inability to eat due to pain.  Also reports nausea since last night.  Unsure if passing gas.  Reports feeling tired and unwell, states I do not feel like getting out of bed right now.   OBJECTIVE: Observations: Tired sounding on phone, poor diction, somewhat labored speech but unclear if this is baseline  ASSESSMENT & PLAN  Concern for SBO, acute abdominal pathology.  AM BM could be stool in rectal vault past transition point.  Recommendations:  Abnormal MRI findings - Given abdominal pain, nausea, inability to tolerate PO, and MRI findings, recommended patient present to ED for evaluation and consideration of CT scan - Recommended immediate presentation to ED and recommended calling EMS if unable to find a ride, emphasized importance of going to ED ASAP  - Red flags discussed - Will forward to PCP  Selena Swaminathan Toma, MD PGY-2, [facility] 01/08/24, 12:44 PM
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
## 2024-01-08 NOTE — ED Provider Notes (Signed)
 [place] EMERGENCY DEPARTMENT AT [facility] Provider Note   CSN: [id number] Arrival date & time: 01/08/24  1401     Patient presents with: Abdominal Pain   Edward Newton is a 83 y.o. male.   Patient is an 83 year old male with a history of hypertension, diabetes, hyperlipidemia, status post small bowel resection in 2023, incisional hernia repair in 2025 who is presenting today with abdominal pain.  Patient reports abdominal pain started last night about 7 PM with associated nausea and poor appetite.  He reports the pain has been persistent but the nausea has improved some.  His last bowel movement was early this morning and it was very hard and small and since that time he is not sure if he has passed any gas.  Patient was scheduled to have an MRI done today of his abdomen due to surveillance for a known pancreatic lesion.  He was called by his PCP this afternoon and told to come back to the emergency room.  Based on PCPs notes radiology called them stating patient had evidence of a small bowel obstruction concerning for transition point for an incisional hernia and he needs a CT with contrast to further evaluate.  Patient denies a fever, cough, shortness of breath or urinary complaints at this time.  The history is provided by the patient.  Abdominal Pain      Prior to Admission medications   Medication Sig Start Date End Date Taking? Authorizing Provider  atorvastatin  (LIPITOR) 40 MG tablet Take 1 tablet (40 mg total) by mouth daily. 08/06/23   Howell Lunger, DO  Blood Glucose Monitoring Suppl (ONETOUCH VERIO FLEX SYSTEM) w/Device KIT Use as directed to check blood sugar 12/23/23   Tharon Lung, MD  fluticasone  (FLONASE ) 50 MCG/ACT nasal spray Place 2 sprays into both nostrils daily. 08/06/23   Howell Lunger, DO  glucose blood [facility] VERIO) test strip Check blood sugar once daily 12/23/23   Tharon Lung, MD  hydrochlorothiazide  (HYDRODIURIL ) 12.5 MG tablet Take 1  tablet (12.5 mg total) by mouth daily. 08/13/23   Tharon Lung, MD  metFORMIN  (GLUCOPHAGE -XR) 500 MG 24 hr tablet Take 1 tablet (500 mg total) by mouth daily with breakfast. 12/01/23   Tharon Lung, MD  methocarbamol  (ROBAXIN -750) 750 MG tablet Take 1 tablet (750 mg total) by mouth every 6 (six) hours as needed for muscle spasms. 08/23/23   Stechschulte, Deward PARAS, MD  OneTouch Delica Lancets 33G MISC Check blood sugar once daily 09/10/23   Quillen, Michael, MD  oxyCODONE -acetaminophen  (PERCOCET) 5-325 MG tablet Take 1 tablet by mouth every 4 (four) hours as needed for severe pain (pain score 7-10). 08/23/23 08/22/24  Stechschulte, Deward PARAS, MD  tamsulosin  (FLOMAX ) 0.4 MG CAPS capsule Take 1 capsule (0.4 mg total) by mouth every evening. 09/04/22   Macario Dorothyann HERO, MD    Allergies: Lisinopril     Review of Systems  Gastrointestinal:  Positive for abdominal pain.    Updated Vital Signs BP 122/63 (BP Location: Left Arm)   Pulse (!) 104   Temp 99.1 F (37.3 C) (Oral)   Resp 16   SpO2 96%   Physical Exam Vitals and nursing note reviewed.  Constitutional:      General: He is not in acute distress.    Appearance: He is well-developed.  HENT:     Head: Normocephalic and atraumatic.  Eyes:     Conjunctiva/sclera: Conjunctivae normal.     Pupils: Pupils are equal, round, and reactive to light.  Cardiovascular:  Rate and Rhythm: Normal rate and regular rhythm.     Heart sounds: No murmur heard. Pulmonary:     Effort: Pulmonary effort is normal. No respiratory distress.     Breath sounds: Normal breath sounds. No wheezing or rales.  Abdominal:     General: Bowel sounds are decreased. There is no distension.     Palpations: Abdomen is soft.     Tenderness: There is generalized abdominal tenderness. There is no guarding or rebound.     Comments: Well-healed midline abdominal scar.  Musculoskeletal:        General: No tenderness. Normal range of motion.     Cervical back: Normal range of  motion and neck supple.  Skin:    General: Skin is warm and dry.     Findings: No erythema or rash.  Neurological:     Mental Status: He is alert and oriented to person, place, and time.  Psychiatric:        Behavior: Behavior normal.     (all labs ordered are listed, but only abnormal results are displayed) Labs Reviewed  COMPREHENSIVE METABOLIC PANEL WITH GFR - Abnormal; Notable for the following components:      Result Value   Potassium 3.4 (*)    Glucose, Bld 150 (*)    Total Bilirubin 1.7 (*)    All other components within normal limits  CBC - Abnormal; Notable for the following components:   RDW 16.1 (*)    All other components within normal limits  LIPASE, BLOOD  URINALYSIS, ROUTINE W REFLEX MICROSCOPIC    EKG: None  Radiology: MR Abdomen W Wo Contrast Result Date: 01/08/2024 CLINICAL DATA:  Follow-up cystic pancreatic lesion. Currently complaining of severe nausea. EXAM: MRI ABDOMEN WITHOUT AND WITH CONTRAST TECHNIQUE: Multiplanar multisequence MR imaging of the abdomen was performed both before and after the administration of intravenous contrast. CONTRAST:  7 mL Vueway  COMPARISON:  06/29/2022 FINDINGS: Image degradation by respiratory motion artifact noted. Lower chest: No acute findings. 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 Pancreas: 5 mm cyst is again seen at the junction of the pancreatic body and tail on image 14/13, stable since previous study. No evidence pancreatic mass or ductal dilatation. Spleen:  Unremarkable. 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 Tiny benign right renal cyst again noted. (No followup imaging is recommended for either of these findings). No suspicious masses identified. No evidence of hydronephrosis. Stomach/Bowel: Multiple moderately dilated small bowel loops with air-fluid levels are  seen in the abdomen and visualized portion of upper pelvis. Possible transition point is seen in the left lower quadrant where a swirled pattern is seen in the mesentery. This is consistent with a small-bowel obstruction which may be due to an internal hernia. Vascular/Lymphatic: No pathologically enlarged lymph nodes identified. No acute vascular findings. Other:  Mild ascites noted. Musculoskeletal:  No suspicious bone lesions identified. 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 Recommend continued follow-up by MRI in 2 years. Small-bowel obstruction, with possible transition point in the left lower quadrant which may be due to an internal hernia. Recommend abdomen pelvis CT for further evaluation. Mild ascites. These results will be called to the ordering clinician or representative by the Radiologist Assistant, and communication documented in the PACS or [employer]. Electronically Signed   By: Norleen DELENA Graham CHRISTELLA.D.  On: 01/08/2024 12:13     Procedures   Medications Ordered in the ED  lactated ringers  bolus 1,000 mL (has no administration in time range)  ondansetron  (ZOFRAN ) injection 4 mg (has no administration in time range)  morphine  (PF) 4 MG/ML injection 4 mg (has no administration in time range)  lactated ringers  infusion (has no administration in time range)                                    Medical Decision Making Amount and/or Complexity of Data Reviewed Labs: ordered. Decision-making details documented in ED Course.  Risk Prescription drug management. Decision regarding hospitalization.   Pt with multiple medical problems and comorbidities and presenting today with a complaint that caries a high risk for morbidity and mortality.  Here today with complaint of abdominal pain and nausea.  Patient has had multiple abdominal surgeries and concern for small bowel obstruction versus UTI  versus abdominal abscess.  Lower suspicion for cholecystitis, hepatitis or pancreatitis.  Patient had an MRI today that showed a small bowel obstruction with concern for an internal hernia.  Patient is still having abdominal pain but has still not had any vomiting and reports the nausea is a little bit better but still is not eating.  He did have 1 small hard bowel movement this morning but is not sure that he is passed any gas since.  Will discuss MRI results with general surgery to see if they feel a CT is necessary.  Patient is hemodynamically stable at this time.   Spoke with Dr. Belinda with general surgery and they will consult on him but currently does not need an NGT and NPO with fluids and pain/nausea control. 3:03 PM I independently interpreted patient's labs and CBC within normal limits, CMP and lipase wnl.  Consulted hospitalist for admission      Final diagnoses:  Small bowel obstruction due to adhesions [facility])    ED Discharge Orders     None          Doretha Folks, MD 01/08/24 1503

## 2024-01-09 ENCOUNTER — Inpatient Hospital Stay (HOSPITAL_COMMUNITY)

## 2024-01-09 DIAGNOSIS — K56609 Unspecified intestinal obstruction, unspecified as to partial versus complete obstruction: Secondary | ICD-10-CM | POA: Diagnosis not present

## 2024-01-09 DIAGNOSIS — K6389 Other specified diseases of intestine: Secondary | ICD-10-CM | POA: Diagnosis not present

## 2024-01-09 DIAGNOSIS — N401 Enlarged prostate with lower urinary tract symptoms: Secondary | ICD-10-CM | POA: Diagnosis not present

## 2024-01-09 DIAGNOSIS — E119 Type 2 diabetes mellitus without complications: Secondary | ICD-10-CM | POA: Diagnosis not present

## 2024-01-09 LAB — GLUCOSE, CAPILLARY
Glucose-Capillary: 105 mg/dL — ABNORMAL HIGH (ref 70–99)
Glucose-Capillary: 106 mg/dL — ABNORMAL HIGH (ref 70–99)
Glucose-Capillary: 121 mg/dL — ABNORMAL HIGH (ref 70–99)
Glucose-Capillary: 127 mg/dL — ABNORMAL HIGH (ref 70–99)
Glucose-Capillary: 93 mg/dL (ref 70–99)
Glucose-Capillary: 96 mg/dL (ref 70–99)

## 2024-01-09 LAB — MAGNESIUM: Magnesium: 2.1 mg/dL (ref 1.7–2.4)

## 2024-01-09 LAB — CBC
HCT: 40.1 % (ref 39.0–52.0)
Hemoglobin: 13.2 g/dL (ref 13.0–17.0)
MCH: 28.7 pg (ref 26.0–34.0)
MCHC: 32.9 g/dL (ref 30.0–36.0)
MCV: 87.2 fL (ref 80.0–100.0)
Platelets: 152 K/uL (ref 150–400)
RBC: 4.6 MIL/uL (ref 4.22–5.81)
RDW: 16.3 % — ABNORMAL HIGH (ref 11.5–15.5)
WBC: 3.3 K/uL — ABNORMAL LOW (ref 4.0–10.5)
nRBC: 0 % (ref 0.0–0.2)

## 2024-01-09 LAB — COMPREHENSIVE METABOLIC PANEL WITH GFR
ALT: 15 U/L (ref 0–44)
AST: 16 U/L (ref 15–41)
Albumin: 3.1 g/dL — ABNORMAL LOW (ref 3.5–5.0)
Alkaline Phosphatase: 54 U/L (ref 38–126)
Anion gap: 6 (ref 5–15)
BUN: 19 mg/dL (ref 8–23)
CO2: 27 mmol/L (ref 22–32)
Calcium: 8.6 mg/dL — ABNORMAL LOW (ref 8.9–10.3)
Chloride: 111 mmol/L (ref 98–111)
Creatinine, Ser: 1.28 mg/dL — ABNORMAL HIGH (ref 0.61–1.24)
GFR, Estimated: 56 mL/min — ABNORMAL LOW (ref >60)
Glucose, Bld: 123 mg/dL — ABNORMAL HIGH (ref 70–99)
Potassium: 3.7 mmol/L (ref 3.5–5.1)
Sodium: 144 mmol/L (ref 135–145)
Total Bilirubin: 1.7 mg/dL — ABNORMAL HIGH (ref 0.0–1.2)
Total Protein: 6.3 g/dL — ABNORMAL LOW (ref 6.5–8.1)

## 2024-01-09 LAB — FOLATE: Folate: 10.2 ng/mL (ref >5.9)

## 2024-01-09 LAB — PHOSPHORUS: Phosphorus: 4.5 mg/dL (ref 2.5–4.6)

## 2024-01-09 LAB — VITAMIN B12: Vitamin B-12: 183 pg/mL (ref 180–914)

## 2024-01-09 MED ORDER — DIATRIZOATE MEGLUMINE & SODIUM 66-10 % PO SOLN
90.0000 mL | Freq: Once | ORAL | Status: DC
  Filled 2024-01-09: qty 90

## 2024-01-09 MED ORDER — DIATRIZOATE MEGLUMINE & SODIUM 66-10 % PO SOLN
90.0000 mL | Freq: Once | ORAL | Status: AC
  Administered 2024-01-09: 90 mL via ORAL
  Filled 2024-01-09: qty 90

## 2024-01-09 MED ORDER — LACTATED RINGERS IV SOLN: INTRAVENOUS | Status: DC

## 2024-01-09 MED ORDER — CYANOCOBALAMIN 1000 MCG/ML IJ SOLN
1000.0000 ug | Freq: Once | INTRAMUSCULAR | Status: AC
  Administered 2024-01-09: 1000 ug via INTRAMUSCULAR
  Filled 2024-01-09: qty 1

## 2024-01-09 NOTE — Evaluation (Signed)
 Physical Therapy Evaluation Patient Details Name: Edward Newton MRN: [id number] DOB: 11-19-1940 Today's Date: 01/09/2024  History of Present Illness  83 y.o. admitted after  scheduled MRI that found SBO   Patient has had abd pain, feeling nauseous. male with medical history significant of HTN, HLD, DM2 status post small bowel resection in 2023, incisional hernia repair in 2025 , BPH  Clinical Impression  Patient evaluated by Physical Therapy with no further acute PT needs identified. All education has been completed and the patient has no further questions.  Pt amb ~1000' with RW, mod I, able to amb short distances without device, no LOB No f/u needed   See below for any follow-up Physical Therapy or equipment needs. PT is signing off. Thank you for this referral.         If plan is discharge home, recommend the following:     Can travel by private vehicle        Equipment Recommendations None recommended by PT  Recommendations for Other Services       Functional Status Assessment Patient has not had a recent decline in their functional status     Precautions / Restrictions Precautions Precautions: None Restrictions Weight Bearing Restrictions Per Provider Order: No      Mobility  Bed Mobility Overal bed mobility: Independent                  Transfers Overall transfer level: Independent                      Ambulation/Gait Ambulation/Gait assistance: Independent Gait Distance (Feet): 1000 Feet Assistive device: Rolling walker (2 wheels), None Gait Pattern/deviations: WFL(Within Functional Limits) Gait velocity: WNL     General Gait Details: prefers to use RW however is able to carry walker and amb without LOB. steady with and without RW. walks at rapid pace  Stairs            Wheelchair Mobility     Tilt Bed    Modified Rankin (Stroke Patients Only)       Balance Overall balance assessment: No apparent balance deficits (not  formally assessed)                                           Pertinent Vitals/Pain Pain Assessment Pain Assessment: No/denies pain    Home Living Family/patient expects to be discharged to:: Private residence Living Arrangements: Spouse/significant other Available Help at Discharge: Family Type of Home: House Home Access: Level entry     Alternate Level Stairs-Number of Steps: 13 Home Layout: Bed/bath upstairs;Two level Home Equipment: Cane - single point;Grab bars - tub/shower      Prior Function Prior Level of Function : Independent/Modified Independent             Mobility Comments: independent ADLs Comments: independent     Extremity/Trunk Assessment   Upper Extremity Assessment Upper Extremity Assessment: Overall WFL for tasks assessed    Lower Extremity Assessment Lower Extremity Assessment: Overall WFL for tasks assessed       Communication   Communication Communication: No apparent difficulties    Cognition Arousal: Alert Behavior During Therapy: WFL for tasks assessed/performed   PT - Cognitive impairments: No apparent impairments                         Following  commands: Intact       Cueing       General Comments      Exercises     Assessment/Plan    PT Assessment Patient does not need any further PT services  PT Problem List         PT Treatment Interventions      PT Goals (Current goals can be found in the Care Plan section)  Acute Rehab PT Goals PT Goal Formulation: All assessment and education complete, DC therapy    Frequency       Co-evaluation               AM-PAC PT 6 Clicks Mobility  Outcome Measure Help needed turning from your back to your side while in a flat bed without using bedrails?: None Help needed moving from lying on your back to sitting on the side of a flat bed without using bedrails?: None Help needed moving to and from a bed to a chair (including a  wheelchair)?: None Help needed standing up from a chair using your arms (e.g., wheelchair or bedside chair)?: None Help needed to walk in hospital room?: None Help needed climbing 3-5 steps with a railing? : None 6 Click Score: 24    End of Session   Activity Tolerance: Patient tolerated treatment well Patient left: with call bell/phone within reach;in bed;with family/visitor present Nurse Communication: Mobility status      Time: 8484-8466 PT Time Calculation (min) (ACUTE ONLY): 18 min   Charges:   PT Evaluation $PT Eval Low Complexity: 1 Low   PT General Charges $$ ACUTE PT VISIT: 1 Visit         Berlin Mokry, PT  Acute Rehab Dept [facility]) [phone]  01/09/2024   [facility] 01/09/2024, 3:54 PM

## 2024-01-09 NOTE — Plan of Care (Signed)
  Problem: Fluid Volume: Goal: Ability to maintain a balanced intake and output will improve Outcome: Progressing   Problem: Skin Integrity: Goal: Risk for impaired skin integrity will decrease Outcome: Progressing   Problem: Activity: Goal: Risk for activity intolerance will decrease Outcome: Progressing   Problem: Pain Managment: Goal: General experience of comfort will improve and/or be controlled Outcome: Progressing

## 2024-01-09 NOTE — TOC Initial Note (Signed)
 Transition of Care [facility] - Initial/Assessment Note    Patient Details  Name: Edward Newton MRN: [id number] Date of Birth: [birth year]  Transition of Care [facility]) CM/SW Contact:    Sheri ONEIDA Sharps, LCSW Phone Number: 01/09/2024, 3:38 PM  Clinical Narrative:                 Pt from home with spouse. Pt continues medical workup.PT eval pending. TOC following for dc needs.    Barriers to Discharge: Continued Medical Work up   Patient Goals and CMS Choice Patient states their goals for this hospitalization and ongoing recovery are:: return home   Choice offered to / list presented to : NA      Expected Discharge Plan and Services In-house Referral: NA Discharge Planning Services: NA   Living arrangements for the past 2 months: Single Family Home                 DME Arranged: N/A DME Agency: NA       HH Arranged: NA HH Agency: NA        Prior Living Arrangements/Services Living arrangements for the past 2 months: Single Family Home   Patient language and need for interpreter reviewed:: Yes Do you feel safe going back to the place where you live?: Yes      Need for Family Participation in Patient Care: Yes (Comment) Care giver support system in place?: Yes (comment)   Criminal Activity/Legal Involvement Pertinent to Current Situation/Hospitalization: No - Comment as needed  Activities of Daily Living   ADL Screening (condition at time of admission) Independently performs ADLs?: Yes (appropriate for developmental age) Is the patient deaf or have difficulty hearing?: Yes Does the patient have difficulty seeing, even when wearing glasses/contacts?: No Does the patient have difficulty concentrating, remembering, or making decisions?: No  Permission Sought/Granted                  Emotional Assessment Appearance:: Appears stated age Attitude/Demeanor/Rapport: Engaged Affect (typically observed): Accepting Orientation: : Oriented to Self, Oriented to Place,  Oriented to  Time, Oriented to Situation Alcohol / Substance Use: Not Applicable Psych Involvement: No (comment)  Admission diagnosis:  SBO (small bowel obstruction) (HCC) [K56.609] Small bowel obstruction due to adhesions 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   ALLERGIC RHINITIS, SEASONAL 10/19/2008   BPH (benign prostatic hyperplasia) 10/19/2008   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/2008   PCP:  Tharon Lung, MD Pharmacy:   [facility] [phone] [address], [place] - [address] AT [facility] [address] [place] [address] Phone: [phone] Fax: [phone]     Social Drivers of Health (SDOH) Social History: SDOH Screenings   Food Insecurity: No Food Insecurity (01/08/2024)  Housing: Low Risk  (01/08/2024)  Transportation Needs: No Transportation Needs (01/08/2024)  Utilities: Not At Risk (01/08/2024)  Alcohol Screen: Low Risk  (09/16/2023)  Depression (PHQ2-9): Low Risk  (12/08/2023)  Financial Resource Strain: Low Risk  (09/16/2023)  Physical Activity: Insufficiently Active (09/16/2023)  Social Connections: Socially Integrated (01/08/2024)  Stress: No Stress Concern Present (09/16/2023)  Tobacco Use: Medium Risk (01/08/2024)  Health Literacy: Adequate Health Literacy (09/16/2023)   SDOH Interventions:     Readmission Risk Interventions    08/24/2023   11:10 AM  Readmission Risk Prevention Plan  Post Dischage Appt Complete   Medication Screening Complete  Transportation Screening Complete

## 2024-01-09 NOTE — Progress Notes (Signed)
 [facility]                                                                               Edward Newton, is a 83 y.o. male, DOB - [birth year], FMW:[id number] Admit date - 01/08/2024    Outpatient Primary MD for the patient is Edward Lung, MD  LOS - 1  days    Brief summary   83 y.o. male with medical history significant of HTN, HLD, DM2, s/p exp lap with small bowel resection for SBO, ventral hernia, s/p hernia repair with mesh on 08/2023 presents with nausea and abdominal pain  admitted for SBO.    Assessment & Plan    Assessment and Plan:   Small bowel obstruction:  ABD X RAY Gaseous distention of small bowel loops in the mid abdomen measuring up to 5 cm, consistent with small bowel obstruction. Continue with NPO with ice chips.  Gen surgery on board.  Gentle hydration , ambulate as tolerated.     Hypertension Well controlled.   Type 2 DM CBG (last 3)  Recent Labs    01/08/24 2351 01/09/24 0410 01/09/24 0746  GLUCAP 132* 127* 96   A1c is 6.5, well controlled.    BPH   Estimated body mass index is 21.79 kg/m as calculated from the following:   Height as of this encounter: 5' 10 (1.778 m).   Weight as of this encounter: 68.9 kg.  Code Status: full code.  DVT Prophylaxis:  SCDs Start: 01/08/24 1802   Level of Care: Level of care: Telemetry Family Communication: none at bedside.   Disposition Plan:     Remains inpatient appropriate:  PENDING resolution of SBO.   Procedures:  Abd x ray.   Consultants:   General surgery.   Antimicrobials:   Anti-infectives (From admission, onward)    None        Medications  Scheduled Meds:  cyanocobalamin   1,000 mcg Intramuscular Once   insulin  aspart  0-9 Units Subcutaneous Q4H   Continuous Infusions:  lactated ringers  75 mL/hr at 01/09/24 0744   PRN Meds:.acetaminophen  **OR** acetaminophen , HYDROcodone -acetaminophen , HYDROmorphone  (DILAUDID ) injection, ondansetron  **OR**  ondansetron  (ZOFRAN ) IV    Subjective:   Edward Newton was seen and examined today.  No BM or flatus overnight.   Objective:   Vitals:   01/08/24 1900 01/08/24 1915 01/09/24 0214 01/09/24 0441  BP:  117/67 110/65 (!) 141/84  Pulse:  75 78 76  Resp:  18 18 16   Temp:  98 F (36.7 C) 97.7 F (36.5 C) 98 F (36.7 C)  TempSrc:  Oral  Oral  SpO2:  96% 90% 91%  Weight: 68.9 kg     Height: 5' 10 (1.778 m)       Intake/Output Summary (Last 24 hours) at 01/09/2024 0923 Last data filed at 01/09/2024 0723 Gross per 24 hour  Intake 2489.17 ml  Output 400 ml  Net 2089.17 ml   Filed Weights   01/08/24 1900  Weight: 68.9 kg     Exam General exam: Appears calm and comfortable  Respiratory system: Clear to auscultation. Respiratory effort normal. Cardiovascular system: S1 & S2 heard, RRR. No JVD, Gastrointestinal system: Abdomen is soft,  mild distended, bowel sounds minimal.  Central nervous system: Alert and oriented. No focal neurological deficits. Extremities: Symmetric 5 x 5 power. Skin: No rashes,  Psychiatry: Mood & affect appropriate.    Data Reviewed:  I have personally reviewed following labs and imaging studies   CBC Lab Results  Component Value Date   WBC 3.3 (L) 01/09/2024   RBC 4.60 01/09/2024   HGB 13.2 01/09/2024   HCT 40.1 01/09/2024   MCV 87.2 01/09/2024   MCH 28.7 01/09/2024   PLT 152 01/09/2024   MCHC 32.9 01/09/2024   RDW 16.3 (H) 01/09/2024   LYMPHSABS 0.4 (L) 05/29/2022   MONOABS 0.3 05/29/2022   EOSABS 0.0 05/29/2022   BASOSABS 0.0 05/29/2022     Last metabolic panel Lab Results  Component Value Date   NA 144 01/09/2024   K 3.7 01/09/2024   CL 111 01/09/2024   CO2 27 01/09/2024   BUN 19 01/09/2024   CREATININE 1.28 (H) 01/09/2024   GLUCOSE 123 (H) 01/09/2024   GFRNONAA 56 (L) 01/09/2024   GFRAA 58 (L) 02/02/2020   CALCIUM  8.6 (L) 01/09/2024   PHOS 4.5 01/09/2024   PROT 6.3 (L) 01/09/2024   ALBUMIN  3.1 (L) 01/09/2024    LABGLOB 2.6 07/07/2023   AGRATIO 1.5 02/02/2020   BILITOT 1.7 (H) 01/09/2024   ALKPHOS 54 01/09/2024   AST 16 01/09/2024   ALT 15 01/09/2024   ANIONGAP 6 01/09/2024    CBG (last 3)  Recent Labs    01/08/24 2351 01/09/24 0410 01/09/24 0746  GLUCAP 132* 127* 96      Coagulation Profile: No results for input(s): INR, PROTIME in the last 168 hours.   Radiology Studies: DG Abd 1 View Result Date: 01/09/2024 EXAM: 1 VIEW XRAY OF THE ABDOMEN 01/09/2024 07:15:00 AM COMPARISON: CT of the abdomen and pelvis 01/11/2023. CLINICAL HISTORY: 881154 SBO (small bowel obstruction) (HCC) [id number]. ABD ordered for abdominal pain. FINDINGS: BOWEL: Gaseous distention small bowel loops in the mid abdomen measuring up to 5 cm. Gas is present in the colon. SOFT TISSUES: No abnormal calcifications or opaque urinary calculi. BONES: Degenerative changes are noted at the hips bilaterally, left greater than right. No acute osseous abnormality. IMPRESSION: 1. Gaseous distention of small bowel loops in the mid abdomen measuring up to 5 cm, consistent with small bowel obstruction. 2. Gas is present in the colon. Electronically signed by: Lonni Necessary MD 01/09/2024 08:10 AM EDT RP Workstation: [id number]   MR Abdomen W Wo Contrast Result Date: 01/08/2024 CLINICAL DATA:  Follow-up cystic pancreatic lesion. Currently complaining of severe nausea. EXAM: MRI ABDOMEN WITHOUT AND WITH CONTRAST TECHNIQUE: Multiplanar multisequence MR imaging of the abdomen was performed both before and after the administration of intravenous contrast. CONTRAST:  7 mL Vueway  COMPARISON:  06/29/2022 FINDINGS: Image degradation by respiratory motion artifact noted. Lower chest: No acute findings. 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 Pancreas: 5 mm cyst is again seen at the junction of the pancreatic body and tail on image 14/13, stable since previous study. No evidence  pancreatic mass or ductal dilatation. Spleen:  Unremarkable. 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 Tiny benign right renal cyst again noted. (No followup imaging is recommended for either of these findings). No suspicious masses identified. No evidence of hydronephrosis. Stomach/Bowel: Multiple moderately dilated small bowel loops with air-fluid levels are seen in the abdomen and visualized portion of upper pelvis.  Possible transition point is seen in the left lower quadrant where a swirled pattern is seen in the mesentery. This is consistent with a small-bowel obstruction which may be due to an internal hernia. Vascular/Lymphatic: No pathologically enlarged lymph nodes identified. No acute vascular findings. Other:  Mild ascites noted. Musculoskeletal:  No suspicious bone lesions identified. 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 Recommend continued follow-up by MRI in 2 years. Small-bowel obstruction, with possible transition point in the left lower quadrant which may be due to an internal hernia. Recommend abdomen pelvis CT for further evaluation. Mild ascites. These results will be called to the ordering clinician or representative by the Radiologist Assistant, and communication documented in the PACS or [employer]. Electronically Signed   By: Norleen DELENA Kil M.D.   On: 01/08/2024 12:13       Elgie Butter M.D. [facility] 01/09/2024, 9:23 AM  Available via Epic secure chat 7am-7pm After 7 pm, please refer to night coverage provider listed on amion.

## 2024-01-09 NOTE — Progress Notes (Signed)
 Progress Note     Interval: Still not passing flatus and having diffuse abdominal pain  Objective: Vital signs in last 24 hours: Temp:  [97.7 F (36.5 C)-99.1 F (37.3 C)] 98 F (36.7 C) (07/27 0441) Pulse Rate:  [75-104] 76 (07/27 0441) Resp:  [16-18] 16 (07/27 0441) BP: (110-153)/(63-84) 141/84 (07/27 0441) SpO2:  [90 %-96 %] 91 % (07/27 0441) Weight:  [68.9 kg] 68.9 kg (07/26 1900) Last BM Date : 01/08/24  Intake/Output from previous day: 07/26 0701 - 07/27 0700 In: 2489.2 [I.V.:1309.9; IV Piggyback:1179.3] Out: -  Intake/Output this shift: Total I/O In: -  Out: 700 [Urine:700]  PE: General: pleasant, NAD Heart: regular, rate, and rhythm Lungs: Normal work of breathing on room air Abd: soft, distended, diffusely tender to palpation, no guarding or rebound MS: all 4 extremities are symmetrical with no cyanosis, clubbing, or edema. Skin: warm and dry with no masses, lesions, or rashes Neuro: No focal neurologic deficits Psych: A&Ox3 with an appropriate affect.    Lab Results:  Recent Labs    01/08/24 1424 01/09/24 0529  WBC 6.5 3.3*  HGB 14.7 13.2  HCT 44.6 40.1  PLT 194 152   BMET Recent Labs    01/08/24 2243 01/09/24 0529  NA 144 144  K 4.2 3.7  CL 108 111  CO2 26 27  GLUCOSE 143* 123*  BUN 20 19  CREATININE 1.35* 1.28*  CALCIUM  8.8* 8.6*   PT/INR No results for input(s): LABPROT, INR in the last 72 hours. CMP     Component Value Date/Time   NA 144 01/09/2024 0529   NA 143 07/07/2023 1021   K 3.7 01/09/2024 0529   CL 111 01/09/2024 0529   CO2 27 01/09/2024 0529   GLUCOSE 123 (H) 01/09/2024 0529   BUN 19 01/09/2024 0529   BUN 13 07/07/2023 1021   CREATININE 1.28 (H) 01/09/2024 0529   CREATININE 1.27 (H) 05/14/2016 1534   CALCIUM  8.6 (L) 01/09/2024 0529   PROT 6.3 (L) 01/09/2024 0529   PROT 6.8 07/07/2023 1021   ALBUMIN  3.1 (L) 01/09/2024 0529   ALBUMIN  4.2 07/07/2023 1021   AST 16 01/09/2024 0529   ALT 15 01/09/2024 0529    ALKPHOS 54 01/09/2024 0529   BILITOT 1.7 (H) 01/09/2024 0529   BILITOT 0.7 07/07/2023 1021   GFRNONAA 56 (L) 01/09/2024 0529   GFRNONAA 56 (L) 05/14/2016 1534   GFRAA 58 (L) 02/02/2020 1017   GFRAA 64 05/14/2016 1534   Lipase     Component Value Date/Time   LIPASE 22 01/08/2024 1424       Studies/Results: DG Abd 1 View Result Date: 01/09/2024 EXAM: 1 VIEW XRAY OF THE ABDOMEN 01/09/2024 07:15:00 AM COMPARISON: CT of the abdomen and pelvis 01/11/2023. CLINICAL HISTORY: 881154 SBO (small bowel obstruction) (HCC) [id number]. ABD ordered for abdominal pain. FINDINGS: BOWEL: Gaseous distention small bowel loops in the mid abdomen measuring up to 5 cm. Gas is present in the colon. SOFT TISSUES: No abnormal calcifications or opaque urinary calculi. BONES: Degenerative changes are noted at the hips bilaterally, left greater than right. No acute osseous abnormality. IMPRESSION: 1. Gaseous distention of small bowel loops in the mid abdomen measuring up to 5 cm, consistent with small bowel obstruction. 2. Gas is present in the colon. Electronically signed by: Lonni Necessary MD 01/09/2024 08:10 AM EDT RP Workstation: [id number]   MR Abdomen W Wo Contrast Result Date: 01/08/2024 CLINICAL DATA:  Follow-up cystic pancreatic lesion. Currently complaining of severe nausea. EXAM: MRI ABDOMEN  WITHOUT AND WITH CONTRAST TECHNIQUE: Multiplanar multisequence MR imaging of the abdomen was performed both before and after the administration of intravenous contrast. CONTRAST:  7 mL Vueway  COMPARISON:  06/29/2022 FINDINGS: Image degradation by respiratory motion artifact noted. Lower chest: No acute findings. 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 Pancreas: 5 mm cyst is again seen at the junction of the pancreatic body and tail on image 14/13, stable since previous study. No evidence pancreatic mass or ductal dilatation. Spleen:  Unremarkable. Adrenals/Urinary  Tract: Stable small right adrenal mass showing signal dropout on chemical shift imaging, consistent with benign adenoma. Tiny benign right renal cyst again noted. (No followup imaging is recommended for either of these findings). No suspicious masses identified. No evidence of hydronephrosis. Stomach/Bowel: Multiple moderately dilated small bowel loops with air-fluid levels are seen in the abdomen and visualized portion of upper pelvis. Possible transition point is seen in the left lower quadrant where a swirled pattern is seen in the mesentery. This is consistent with a small-bowel obstruction which may be due to an internal hernia. Vascular/Lymphatic: No pathologically enlarged lymph nodes identified. No acute vascular findings. Other:  Mild ascites noted. Musculoskeletal:  No suspicious bone lesions identified. 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 Recommend continued follow-up by MRI in 2 years. Small-bowel obstruction, with possible transition point in the left lower quadrant which may be due to an internal hernia. Recommend abdomen pelvis CT for further evaluation. Mild ascites. These results will be called to the ordering clinician or representative by the Radiologist Assistant, and communication documented in the PACS or [employer]. Electronically Signed   By: Norleen DELENA Kil M.D.   On: 01/08/2024 12:13     Assessment/Plan 83 yo M with SBO   LOS: 1 day   - SBO protocol ordered this AM without NGT given no nausea. If patient develops nausea/NGT placement then would recommend NGT placement - Will see if contrast in colon on 8h delay KUB after contrast administration. - Keep NPO - IVF per [facility] - Ok for DVT ppx from surgical perspective  I reviewed hospitalist notes, last 24 h vitals and pain scores, last 48 h intake and output, last 24 h labs and trends, and last 24 h imaging results.  This care  required moderate level of medical decision making.    Richerd Silversmith, MD [facility] 01/09/2024, 9:52 AM Please see Amion for pager number during day hours 7:00am-4:30pm

## 2024-01-09 NOTE — Plan of Care (Signed)
  Problem: Coping: Goal: Ability to adjust to condition or change in health will improve Outcome: Progressing   Problem: Education: Goal: Knowledge of General Education information will improve Description: Including pain rating scale, medication(s)/side effects and non-pharmacologic comfort measures Outcome: Progressing   Problem: Health Behavior/Discharge Planning: Goal: Ability to manage health-related needs will improve Outcome: Progressing   Problem: Clinical Measurements: Goal: Will remain free from infection Outcome: Progressing Goal: Diagnostic test results will improve Outcome: Progressing

## 2024-01-10 DIAGNOSIS — I159 Secondary hypertension, unspecified: Secondary | ICD-10-CM | POA: Diagnosis not present

## 2024-01-10 DIAGNOSIS — K56609 Unspecified intestinal obstruction, unspecified as to partial versus complete obstruction: Secondary | ICD-10-CM | POA: Diagnosis not present

## 2024-01-10 DIAGNOSIS — N401 Enlarged prostate with lower urinary tract symptoms: Secondary | ICD-10-CM | POA: Diagnosis not present

## 2024-01-10 LAB — GLUCOSE, CAPILLARY
Glucose-Capillary: 115 mg/dL — ABNORMAL HIGH (ref 70–99)
Glucose-Capillary: 141 mg/dL — ABNORMAL HIGH (ref 70–99)
Glucose-Capillary: 72 mg/dL (ref 70–99)
Glucose-Capillary: 76 mg/dL (ref 70–99)
Glucose-Capillary: 79 mg/dL (ref 70–99)
Glucose-Capillary: 98 mg/dL (ref 70–99)

## 2024-01-10 MED ORDER — BOOST / RESOURCE BREEZE PO LIQD CUSTOM
1.0000 | Freq: Two times a day (BID) | ORAL | Status: DC
  Administered 2024-01-11: 1 via ORAL

## 2024-01-10 MED ORDER — ADULT MULTIVITAMIN W/MINERALS CH
1.0000 | ORAL_TABLET | Freq: Every day | ORAL | Status: DC
  Administered 2024-01-10 – 2024-01-11 (×2): 1 via ORAL
  Filled 2024-01-10 (×2): qty 1

## 2024-01-10 MED ORDER — TAMSULOSIN HCL 0.4 MG PO CAPS
0.4000 mg | ORAL_CAPSULE | Freq: Every day | ORAL | Status: DC
  Administered 2024-01-10: 0.4 mg via ORAL
  Filled 2024-01-10: qty 1

## 2024-01-10 NOTE — Progress Notes (Signed)
 Progress Note     Subjective: Patient feels that abdominal pain has improved. Reports 3 loose bowel movements. Denies hematochezia. Reports flatulence. Denies nausea, vomiting. Patient requesting food.  ROS   Objective: Vital signs in last 24 hours: Temp:  [98.5 F (36.9 C)-98.9 F (37.2 C)] 98.9 F (37.2 C) (07/28 0356) Pulse Rate:  [80-82] 82 (07/28 0356) Resp:  [16-17] 17 (07/28 0356) BP: (136-142)/(72-76) 136/76 (07/28 0356) SpO2:  [93 %-94 %] 94 % (07/28 0356) Last BM Date : 01/09/24  Intake/Output from previous day: 07/27 0701 - 07/28 0700 In: 1809.4 [P.O.:360; I.V.:1449.4] Out: 950 [Urine:950] Intake/Output this shift: No intake/output data recorded.  PE: General: Pleasant, WD, male who is laying in bed in NAD. Heart: Regular, rate, and rhythm.   Lungs: Respiratory effort nonlabored Abd: Soft with some distention. Nontender to palpation. No guarding or rebound tenderness.  Psych: A&Ox3 with an appropriate affect.    Lab Results:  Recent Labs    01/08/24 1424 01/09/24 0529  WBC 6.5 3.3*  HGB 14.7 13.2  HCT 44.6 40.1  PLT 194 152   BMET Recent Labs    01/08/24 2243 01/09/24 0529  NA 144 144  K 4.2 3.7  CL 108 111  CO2 26 27  GLUCOSE 143* 123*  BUN 20 19  CREATININE 1.35* 1.28*  CALCIUM  8.8* 8.6*   PT/INR No results for input(s): LABPROT, INR in the last 72 hours. CMP     Component Value Date/Time   NA 144 01/09/2024 0529   NA 143 07/07/2023 1021   K 3.7 01/09/2024 0529   CL 111 01/09/2024 0529   CO2 27 01/09/2024 0529   GLUCOSE 123 (H) 01/09/2024 0529   BUN 19 01/09/2024 0529   BUN 13 07/07/2023 1021   CREATININE 1.28 (H) 01/09/2024 0529   CREATININE 1.27 (H) 05/14/2016 1534   CALCIUM  8.6 (L) 01/09/2024 0529   PROT 6.3 (L) 01/09/2024 0529   PROT 6.8 07/07/2023 1021   ALBUMIN  3.1 (L) 01/09/2024 0529   ALBUMIN  4.2 07/07/2023 1021   AST 16 01/09/2024 0529   ALT 15 01/09/2024 0529   ALKPHOS 54 01/09/2024 0529   BILITOT 1.7 (H)  01/09/2024 0529   BILITOT 0.7 07/07/2023 1021   GFRNONAA 56 (L) 01/09/2024 0529   GFRNONAA 56 (L) 05/14/2016 1534   GFRAA 58 (L) 02/02/2020 1017   GFRAA 64 05/14/2016 1534   Lipase     Component Value Date/Time   LIPASE 22 01/08/2024 1424       Studies/Results: DG Abd Portable 1V-Small Bowel Obstruction Protocol-initial, 8 hr delay Result Date: 01/09/2024 CLINICAL DATA:  8 hour small-bowel follow-up film EXAM: PORTABLE ABDOMEN - 1 VIEW COMPARISON:  Film from earlier in the same day. FINDINGS: Administered contrast now lies throughout the colon without significant retention in the small bowel. Persistent small bowel dilatation is noted. No free air is seen. These changes are consistent with partial small bowel obstruction. IMPRESSION: Contrast within the colon consistent with partial small bowel obstruction. Electronically Signed   By: Oneil Devonshire M.D.   On: 01/09/2024 21:10   DG Abd 1 View Result Date: 01/09/2024 EXAM: 1 VIEW XRAY OF THE ABDOMEN 01/09/2024 07:15:00 AM COMPARISON: CT of the abdomen and pelvis 01/11/2023. CLINICAL HISTORY: [id number] SBO (small bowel obstruction) (HCC) [id number]. ABD ordered for abdominal pain. FINDINGS: BOWEL: Gaseous distention small bowel loops in the mid abdomen measuring up to 5 cm. Gas is present in the colon. SOFT TISSUES: No abnormal calcifications or opaque urinary calculi. BONES:  Degenerative changes are noted at the hips bilaterally, left greater than right. No acute osseous abnormality. IMPRESSION: 1. Gaseous distention of small bowel loops in the mid abdomen measuring up to 5 cm, consistent with small bowel obstruction. 2. Gas is present in the colon. Electronically signed by: Lonni Necessary MD 01/09/2024 08:10 AM EDT RP Workstation: [id number]   MR Abdomen W Wo Contrast Result Date: 01/08/2024 CLINICAL DATA:  Follow-up cystic pancreatic lesion. Currently complaining of severe nausea. EXAM: MRI ABDOMEN WITHOUT AND WITH CONTRAST TECHNIQUE:  Multiplanar multisequence MR imaging of the abdomen was performed both before and after the administration of intravenous contrast. CONTRAST:  7 mL Vueway  COMPARISON:  06/29/2022 FINDINGS: Image degradation by respiratory motion artifact noted. Lower chest: No acute findings. 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 Pancreas: 5 mm cyst is again seen at the junction of the pancreatic body and tail on image 14/13, stable since previous study. No evidence pancreatic mass or ductal dilatation. Spleen:  Unremarkable. 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 Tiny benign right renal cyst again noted. (No followup imaging is recommended for either of these findings). No suspicious masses identified. No evidence of hydronephrosis. Stomach/Bowel: Multiple moderately dilated small bowel loops with air-fluid levels are seen in the abdomen and visualized portion of upper pelvis. Possible transition point is seen in the left lower quadrant where a swirled pattern is seen in the mesentery. This is consistent with a small-bowel obstruction which may be due to an internal hernia. Vascular/Lymphatic: No pathologically enlarged lymph nodes identified. No acute vascular findings. Other:  Mild ascites noted. Musculoskeletal:  No suspicious bone lesions identified. 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 Recommend continued follow-up by MRI in 2 years. Small-bowel obstruction, with possible transition point in the left lower quadrant which may be due to an internal hernia. Recommend abdomen pelvis CT for further evaluation. Mild ascites. These results will be called to the ordering clinician or representative by the Radiologist Assistant, and communication documented in the PACS or [employer]. Electronically Signed   By: Norleen DELENA Kil M.D.   On: 01/08/2024 12:13    Anti-infectives: Anti-infectives (From admission, onward)    None        Assessment/Plan SBO - MR Abdomen WWO Contrast 01/08/2024 showed SBO with possible transition point in the left lower quadrant which may be due to an internal hernia.  -DG Abd 1 View 01/09/2024 showed gaseous distention of small bowel loops in the mid abdomen measuring up to 5 cm, consistent with small bowel obstruction. Gas is present in the colon. - DG Abd Port 1V SBO Protocol -initial from 01/09/2024 showed contrast within the colon consistent with partial small bowel obstruction. -Albumin  3.1, Total bilirubin 1.7. Both from 7/27 -Pain has improved and having Bms/flatulence. No nausea or vomiting. Will advance to [facility]D. -IVF per [facility]   FEN: Advance to [facility]; IVF per [facility] (LR infusion 2 75 mL/hr) VTE: SCDs; Okay for DVT ppx from surgical perspective ID: None    LOS: 2 days   I reviewed hospitalist notes, last 24 h vitals and pain scores, last 48 h intake and output, last 24 h labs and trends, and last 24 h imaging results.  This care required moderate level of medical decision making.    Marjorie Carlyon Favre, [facility] Surgery 01/10/2024,  11:26 AM Please see Amion for pager number during day hours 7:00am-4:30pm

## 2024-01-10 NOTE — Plan of Care (Signed)

## 2024-01-10 NOTE — Progress Notes (Signed)
 [facility]  PROGRESS NOTE  Edward Newton FMW:[id number] DOB: 05/14/1941 DOA: 01/08/2024 PCP: Tharon Lung, MD   Brief HPI:   83 y.o. male with medical history significant of HTN, HLD, DM2, s/p exp lap with small bowel resection for SBO, ventral hernia, s/p hernia repair with mesh on 08/2023 presents with nausea and abdominal pain  admitted for SBO.       Assessment/Plan:     Small bowel obstruction:  -Resolved, SBO protocol started per surgery, showed contrast within the colon consistent with partial SBO,  - Started on clear liquid diet      Hypertension Well controlled.    Type 2 DM A1c is 6.5, well controlled.      BPH  -Will restart tamsulosin  0.4 mg daily   Estimated body mass index is 21.79 kg/m as calculated from the following:   Height as of this encounter: 5' 10 (1.778 m).   Weight as of this encounter: 68.9 kg.    Medications     insulin  aspart  0-9 Units Subcutaneous Q4H     Data Reviewed:   CBG:  Recent Labs  Lab 01/09/24 1615 01/09/24 2033 01/09/24 2334 01/10/24 0357 01/10/24 0707  GLUCAP 121* 105* 93 79 76    SpO2: 94 %    Vitals:   01/09/24 0441 01/09/24 1112 01/09/24 2124 01/10/24 0356  BP: (!) 141/84 138/65 (!) 142/72 136/76  Pulse: 76 86 80 82  Resp: 16 16 16 17   Temp: 98 F (36.7 C) 98.3 F (36.8 C) 98.5 F (36.9 C) 98.9 F (37.2 C)  TempSrc: Oral Oral Oral Oral  SpO2: 91% 99% 93% 94%  Weight:      Height:          Data Reviewed:  Basic Metabolic Panel: Recent Labs  Lab 01/08/24 1424 01/08/24 1555 01/08/24 2243 01/09/24 0529  NA 144  --  144 144  K 3.4*  --  4.2 3.7  CL 108  --  108 111  CO2 24  --  26 27  GLUCOSE 150*  --  143* 123*  BUN 20  --  20 19  CREATININE 1.09  --  1.35* 1.28*  CALCIUM  9.6  --  8.8* 8.6*  MG  --  2.2  --  2.1  PHOS  --  3.8  --  4.5    CBC: Recent Labs  Lab 01/08/24 1424 01/09/24 0529  WBC 6.5 3.3*  HGB 14.7 13.2  HCT 44.6 40.1  MCV 84.0 87.2  PLT 194 152     LFT Recent Labs  Lab 01/08/24 1424 01/09/24 0529  AST 19 16  ALT 15 15  ALKPHOS 61 54  BILITOT 1.7* 1.7*  PROT 7.5 6.3*  ALBUMIN  3.9 3.1*     Antibiotics: Anti-infectives (From admission, onward)    None        DVT prophylaxis: SCDs  Code Status: Full code  Family Communication: No family at bedside   CONSULTS General Surgery   Subjective   Has been having BMs.  Wants to eat  food.   Objective    Physical Examination:   General-appears in no acute distress Heart-S1-S2, regular, no murmur auscultated Lungs-clear to auscultation bilaterally, no wheezing or crackles auscultated Abdomen-soft, nontender, no organomegaly, bowel sounds positive Extremities-no edema in the lower extremities Neuro-alert, oriented x3, no focal deficit noted  Status is: Inpatient:             Edward Newton   [facility] If 7PM-7AM, please contact  night-coverage at [email or web address], Office  [phone]   01/10/2024, 8:11 AM  LOS: 2 days

## 2024-01-10 NOTE — Progress Notes (Signed)
 Initial Nutrition Assessment  DOCUMENTATION CODES:   Severe malnutrition in context of chronic illness  INTERVENTION:  -Continue to upgraded diet per MD, currently on Clear Liquids -Add Boost Breeze BID -Add MVI -Discussed importance of adequate kcal/pro intake   NUTRITION DIAGNOSIS:   Severe Malnutrition related to chronic illness as evidenced by moderate fat depletion, severe muscle depletion.  GOAL:   Patient will meet greater than or equal to 90% of their needs  MONITOR:   PO intake, Weight trends, Supplement acceptance, Diet advancement, Labs  REASON FOR ASSESSMENT:   NPO/Clear Liquid Diet    ASSESSMENT:   Hx HTN, HLD, DM2 status post small bowel resection in 2023, incisional hernia repair in 2025, BPH. Admitted d/t concern for SBO  Spoke to pt at bedside. Pt denies n/v/c/d or chewing/swallowing difficulties. Last BM 7/27. Pt very distracted by the fact his diet was just upgraded and nursing was bringing him snacks/drinks. Pt states he is very hungry and thirsty. Pt was on IVF, now d/c. NFPE completed (see below), pt meets criteria for [facility]. Add Boost Breeze BID, Add MVI w/min. Encouraged pt to continue to focus on adequate kcal/pro intake. Pt states previous UBW 180 lbs, most recent weight ~150 lbs, though, timeframe of loss is unclear. Pt does endorse significant weight loss after previously gained some back desirably. Pt denies questions/concerns at this time, will continue to monitor, RDN available prn.   Labs BG 72-123 Cr 1.28 Calcium  8.6 Albumin  3.1 GFR 56 A1c 6.5   Medications  insulin  aspart  0-9 Units Subcutaneous Q4H   tamsulosin   0.4 mg Oral QPC supper    NUTRITION - FOCUSED PHYSICAL EXAM:  Flowsheet Row Most Recent Value  Orbital Region Mild depletion  Upper Arm Region Moderate depletion  Thoracic and Lumbar Region Mild depletion  Buccal Region Moderate depletion  Temple Region Severe depletion  Clavicle Bone Region Severe depletion   Clavicle and Acromion Bone Region Severe depletion  Scapular Bone Region Moderate depletion  Dorsal Hand Moderate depletion  Patellar Region Severe depletion  Anterior Thigh Region Severe depletion  Posterior Calf Region Severe depletion  Edema (RD Assessment) None  Hair Reviewed  Eyes Reviewed  Mouth Reviewed  Skin Reviewed  Nails Reviewed    Diet Order:   Diet Order             Diet clear liquid Room service appropriate? Yes; Fluid consistency: Thin  Diet effective now                   EDUCATION NEEDS:   Education needs have been addressed  Skin:  Skin Assessment: Reviewed RN Assessment  Last BM:  7/27  Height:   Ht Readings from Last 1 Encounters:  01/08/24 5' 10 (1.778 m)    Weight:   Wt Readings from Last 1 Encounters:  01/08/24 68.9 kg   BMI:  Body mass index is 21.79 kg/m.  Estimated Nutritional Needs:   Kcal:  1725-2075 kcal  Protein:  80-105 g  Fluid:  >/=2L    Paw Karstens Daml-Budig, RDN, LDN Registered Dietitian Nutritionist RD Inpatient Contact Info in [place]

## 2024-01-10 NOTE — Plan of Care (Signed)
  Problem: Coping: Goal: Ability to adjust to condition or change in health will improve Outcome: Progressing   Problem: Health Behavior/Discharge Planning: Goal: Ability to manage health-related needs will improve Outcome: Progressing   Problem: Metabolic: Goal: Ability to maintain appropriate glucose levels will improve Outcome: Progressing   Problem: Skin Integrity: Goal: Risk for impaired skin integrity will decrease Outcome: Progressing

## 2024-01-10 NOTE — Progress Notes (Signed)
   01/10/24 1330  Spiritual Encounters  Type of Visit Initial  Care provided to: Patient;Significant other  Reason for visit Advance directives  OnCall Visit No  Interventions  Spiritual Care Interventions Made Established relationship of care and support;Compassionate presence;Reflective listening;Explored values/beliefs/practices/strengths;Decision-making support/facilitation  Intervention Outcomes  Outcomes Connection to spiritual care;Awareness around self/spiritual resourses;Connection to values and goals of care;Awareness of support  Advance Directives (For Healthcare)  Does Patient Have a Medical Advance Directive? No  Would patient like information on creating a medical advance directive? Yes (Inpatient - patient defers creating a medical advance directive at this time - Information given)    Chaplain responded to spiritual consult request for Advance Directive. [name]'s wife bedside. I provided the appropriate paperwork and information. 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 He hopes to be discharged tomorrow. He expressed gratitude for chaplain presence, and we remain available.

## 2024-01-11 DIAGNOSIS — K56609 Unspecified intestinal obstruction, unspecified as to partial versus complete obstruction: Secondary | ICD-10-CM | POA: Diagnosis not present

## 2024-01-11 DIAGNOSIS — E43 Unspecified severe protein-calorie malnutrition: Secondary | ICD-10-CM | POA: Insufficient documentation

## 2024-01-11 DIAGNOSIS — K869 Disease of pancreas, unspecified: Secondary | ICD-10-CM | POA: Diagnosis not present

## 2024-01-11 DIAGNOSIS — N401 Enlarged prostate with lower urinary tract symptoms: Secondary | ICD-10-CM | POA: Diagnosis not present

## 2024-01-11 LAB — GLUCOSE, CAPILLARY
Glucose-Capillary: 120 mg/dL — ABNORMAL HIGH (ref 70–99)
Glucose-Capillary: 119 mg/dL — ABNORMAL HIGH (ref 70–99)
Glucose-Capillary: 169 mg/dL — ABNORMAL HIGH (ref 70–99)

## 2024-01-11 NOTE — Progress Notes (Incomplete)
 [facility]  PROGRESS NOTE  Edward Newton FMW:[id number] DOB: 28-Jan-1941 DOA: 01/08/2024 PCP: Tharon Lung, MD   Brief HPI:   83 y.o. male with medical history significant of HTN, HLD, DM2, s/p exp lap with small bowel resection for SBO, ventral hernia, s/p hernia repair with mesh on 08/2023 presents with nausea and abdominal pain  admitted for SBO.       Assessment/Plan:     Small bowel obstruction:  -Resolved, SBO protocol started per surgery, showed contrast within the colon consistent with partial SBO,  - Started on clear liquid diet      Hypertension Well controlled.    Type 2 DM A1c is 6.5, well controlled.      BPH  -Will restart tamsulosin  0.4 mg daily   Estimated body mass index is 21.79 kg/m as calculated from the following:   Height as of this encounter: 5' 10 (1.778 m).   Weight as of this encounter: 68.9 kg.    Medications     feeding supplement  1 Container Oral BID BM   insulin  aspart  0-9 Units Subcutaneous Q4H   multivitamin with minerals  1 tablet Oral Daily   tamsulosin   0.4 mg Oral QPC supper     Data Reviewed:   CBG:  Recent Labs  Lab 01/10/24 1639 01/10/24 1957 01/10/24 2353 01/11/24 0404 01/11/24 0721  GLUCAP 115* 141* 98 120* 119*    SpO2: 95 %    Vitals:   01/10/24 0356 01/10/24 1329 01/10/24 1954 01/11/24 0406  BP: 136/76 (!) 160/87 139/79 120/77  Pulse: 82 83 67 71  Resp: 17 18 18 16   Temp: 98.9 F (37.2 C) 97.7 F (36.5 C) 98.4 F (36.9 C) 98.1 F (36.7 C)  TempSrc: Oral     SpO2: 94% 99% 94% 95%  Weight:      Height:          Data Reviewed:  Basic Metabolic Panel: Recent Labs  Lab 01/08/24 1424 01/08/24 1555 01/08/24 2243 01/09/24 0529  NA 144  --  144 144  K 3.4*  --  4.2 3.7  CL 108  --  108 111  CO2 24  --  26 27  GLUCOSE 150*  --  143* 123*  BUN 20  --  20 19  CREATININE 1.09  --  1.35* 1.28*  CALCIUM  9.6  --  8.8* 8.6*  MG  --  2.2  --  2.1  PHOS  --  3.8  --  4.5     CBC: Recent Labs  Lab 01/08/24 1424 01/09/24 0529  WBC 6.5 3.3*  HGB 14.7 13.2  HCT 44.6 40.1  MCV 84.0 87.2  PLT 194 152    LFT Recent Labs  Lab 01/08/24 1424 01/09/24 0529  AST 19 16  ALT 15 15  ALKPHOS 61 54  BILITOT 1.7* 1.7*  PROT 7.5 6.3*  ALBUMIN  3.9 3.1*     Antibiotics: Anti-infectives (From admission, onward)    None        DVT prophylaxis: SCDs  Code Status: Full code  Family Communication: No family at bedside   CONSULTS General Surgery   Subjective      Objective    Physical Examination:     Status is: Inpatient:             Edward Newton   [facility] If 7PM-7AM, please contact night-coverage at [email or web address], Office  [phone]   01/11/2024, 8:50 AM  LOS: 3 days

## 2024-01-11 NOTE — Discharge Instructions (Addendum)
 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 Recommend continued follow-up by MRI in 2 years

## 2024-01-11 NOTE — Discharge Summary (Addendum)
 Physician Discharge Summary   Patient: Edward Newton MRN: [id number] DOB: 12/20/1940  Admit date:     01/08/2024  Discharge date: 01/11/24  Discharge Physician: Sabas GORMAN Brod   PCP: Tharon Lung, MD   Recommendations at discharge:   Follow-up PCP in 1 week Check BMP in 1 week to assess kidney function  Discharge Diagnoses: Principal Problem:   SBO (small bowel obstruction) (HCC) Active Problems:   Hyperlipidemia   BPH (benign prostatic hyperplasia)   Secondary hypertension   Type 2 diabetes mellitus, controlled (HCC)   Pancreatic lesion   Hypokalemia   Protein-calorie malnutrition, severe  Resolved Problems:   * No resolved hospital problems. [facility] Course: 83 y.o. male with medical history significant of HTN, HLD, DM2, s/p exp lap with small bowel resection for SBO, ventral hernia, s/p hernia repair with mesh on 08/2023 presents with nausea and abdominal pain admitted for SBO.    Assessment and Plan:  Small bowel obstruction:  -Resolved, SBO protocol started per surgery, showed contrast within the colon consistent with partial SBO,  - Started on clear liquid diet - Diet advanced to soft diet today.  Patient tolerating diet well. -General Surgery has cleared for discharge       Hypertension Well controlled.  -Continue HCTZ 12.5 mg daily   Type 2 DM A1c is 6.5, well controlled.  -Continue metformin   CKD stage IIIa -Creatinine stable at 1.28 -Follow-up PCP in 1 week to check BMP as outpatient     BPH  - Continue tamsulosin  0.4 mg daily   Estimated body mass index is 21.79 kg/m as calculated from the following:   Height as of this encounter: 5' 10 (1.778 m).   Weight as of this encounter: 68.9 kg.  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 Recommend continued follow-up by MRI in 2 years      Consultants: General Surgery Procedures performed:    Disposition: Home Diet recommendation:  Discharge Diet Orders (From admission, onward)     Start     Ordered   01/11/24 0000  Diet - low sodium heart healthy        01/11/24 1509           Regular diet DISCHARGE MEDICATION: Allergies as of 01/11/2024       Reactions   Lisinopril  Swelling, Other (See Comments)   Angioedema         Medication List     STOP taking these medications    oxyCODONE -acetaminophen  5-325 MG tablet Commonly known as: Percocet       TAKE these medications    atorvastatin  40 MG tablet Commonly known as: LIPITOR Take 1 tablet (40 mg total) by mouth daily.   fluticasone  50 MCG/ACT nasal spray Commonly known as: FLONASE  Place 2 sprays into both nostrils daily.   hydrochlorothiazide  12.5 MG tablet Commonly known as: HYDRODIURIL  Take 1 tablet (12.5 mg total) by mouth daily.   metFORMIN  500 MG 24 hr tablet Commonly known as: GLUCOPHAGE -XR Take 1 tablet (500 mg total) by mouth daily with breakfast. What changed:  when to take this reasons to take this   methocarbamol  750 MG tablet Commonly known as: Robaxin -750 Take 1 tablet (750 mg total) by mouth every 6 (six) hours as needed for muscle spasms.   OneTouch Delica Lancets 33G Misc Check blood sugar once daily   OneTouch Verio Flex System w/Device Kit Use as directed to check  blood sugar   OneTouch Verio test strip Generic drug: glucose blood Check blood sugar once daily   tamsulosin  0.4 MG Caps capsule Commonly known as: FLOMAX  Take 1 capsule (0.4 mg total) by mouth every evening. What changed: when to take this        Follow-up Information     Tharon Lung, MD Follow up in 1 week(s).   Specialty: Family Medicine Why: Check BMP in 1 week Contact information: [address] [place] [address] [phone]                Discharge Exam: Fredricka Weights   01/08/24 1900  Weight: 68.9 kg   General-appears in no acute distress Heart-S1-S2, regular, no  murmur auscultated Lungs-clear to auscultation bilaterally, no wheezing or crackles auscultated Abdomen-soft, nontender, no organomegaly Extremities-no edema in the lower extremities Neuro-alert, oriented x3, no focal deficit noted  Condition at discharge: good  The results of significant diagnostics from this hospitalization (including imaging, microbiology, ancillary and laboratory) are listed below for reference.   Imaging Studies: DG Abd Portable 1V-Small Bowel Obstruction Protocol-initial, 8 hr delay Result Date: 01/09/2024 CLINICAL DATA:  8 hour small-bowel follow-up film EXAM: PORTABLE ABDOMEN - 1 VIEW COMPARISON:  Film from earlier in the same day. FINDINGS: Administered contrast now lies throughout the colon without significant retention in the small bowel. Persistent small bowel dilatation is noted. No free air is seen. These changes are consistent with partial small bowel obstruction. IMPRESSION: Contrast within the colon consistent with partial small bowel obstruction. Electronically Signed   By: Oneil Devonshire M.D.   On: 01/09/2024 21:10   DG Abd 1 View Result Date: 01/09/2024 EXAM: 1 VIEW XRAY OF THE ABDOMEN 01/09/2024 07:15:00 AM COMPARISON: CT of the abdomen and pelvis 01/11/2023. CLINICAL HISTORY: [id number] SBO (small bowel obstruction) (HCC) [id number]. ABD ordered for abdominal pain. FINDINGS: BOWEL: Gaseous distention small bowel loops in the mid abdomen measuring up to 5 cm. Gas is present in the colon. SOFT TISSUES: No abnormal calcifications or opaque urinary calculi. BONES: Degenerative changes are noted at the hips bilaterally, left greater than right. No acute osseous abnormality. IMPRESSION: 1. Gaseous distention of small bowel loops in the mid abdomen measuring up to 5 cm, consistent with small bowel obstruction. 2. Gas is present in the colon. Electronically signed by: Lonni Necessary MD 01/09/2024 08:10 AM EDT RP Workstation: [id number]   MR Abdomen W Wo Contrast Result  Date: 01/08/2024 CLINICAL DATA:  Follow-up cystic pancreatic lesion. Currently complaining of severe nausea. EXAM: MRI ABDOMEN WITHOUT AND WITH CONTRAST TECHNIQUE: Multiplanar multisequence MR imaging of the abdomen was performed both before and after the administration of intravenous contrast. CONTRAST:  7 mL Vueway  COMPARISON:  06/29/2022 FINDINGS: Image degradation by respiratory motion artifact noted. Lower chest: No acute findings. 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 Pancreas: 5 mm cyst is again seen at the junction of the pancreatic body and tail on image 14/13, stable since previous study. No evidence pancreatic mass or ductal dilatation. Spleen:  Unremarkable. 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 Tiny benign right renal cyst again noted. (No followup imaging is recommended for either of these findings). No suspicious masses identified. No evidence of hydronephrosis. Stomach/Bowel: Multiple moderately dilated small bowel loops with air-fluid levels are seen in the abdomen and visualized portion of upper pelvis. Possible transition point is seen in the left lower quadrant where  a swirled pattern is seen in the mesentery. This is consistent with a small-bowel obstruction which may be due to an internal hernia. Vascular/Lymphatic: No pathologically enlarged lymph nodes identified. No acute vascular findings. Other:  Mild ascites noted. Musculoskeletal:  No suspicious bone lesions identified. 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 Recommend continued follow-up by MRI in 2 years. Small-bowel obstruction, with possible transition point in the left lower quadrant which may be due to an internal hernia. Recommend abdomen pelvis CT for further evaluation. Mild  ascites. These results will be called to the ordering clinician or representative by the Radiologist Assistant, and communication documented in the PACS or [employer]. Electronically Signed   By: Norleen DELENA Kil M.D.   On: 01/08/2024 12:13    Microbiology: Results for orders placed or performed in visit on 02/02/20  Novel Coronavirus, NAA ([facility])     Status: None   Collection Time: 02/02/20 12:00 AM   Specimen: Nasopharyngeal(NP) swabs in vial transport medium   Nasopharynge  Screenin  Result Value Ref Range Status   SARS-CoV-2, NAA Not Detected Not Detected Final    Comment: This nucleic acid amplification test was developed and its performance characteristics determined by [employer]. Nucleic acid amplification tests include RT-PCR and TMA. This test has not been FDA cleared or approved. This test has been authorized by FDA under an Emergency Use Authorization (EUA). This test is only authorized for the duration of time the declaration that circumstances exist justifying the authorization of the emergency use of in vitro diagnostic tests for detection of SARS-CoV-2 virus and/or diagnosis of COVID-19 infection under section 564(b)(1) of the Act, 21 U.S.C. 639aaa-6(a) (1), unless the authorization is terminated or revoked sooner. When diagnostic testing is negative, the possibility of a false negative result should be considered in the context of a patient's recent exposures and the presence of clinical signs and symptoms consistent with COVID-19. An individual without symptoms of COVID-19 and who is not shedding SARS-CoV-2 virus wo uld expect to have a negative (not detected) result in this assay.   SARS-COV-2, NAA 2 DAY TAT     Status: None   Collection Time: 02/02/20 12:00 AM   Nasopharynge  Screenin  Result Value Ref Range Status   SARS-CoV-2, NAA 2 DAY TAT Performed  Final    Labs: CBC: Recent Labs  Lab 01/08/24 1424 01/09/24 0529  WBC 6.5 3.3*  HGB 14.7  13.2  HCT 44.6 40.1  MCV 84.0 87.2  PLT 194 152   Basic Metabolic Panel: Recent Labs  Lab 01/08/24 1424 01/08/24 1555 01/08/24 2243 01/09/24 0529  NA 144  --  144 144  K 3.4*  --  4.2 3.7  CL 108  --  108 111  CO2 24  --  26 27  GLUCOSE 150*  --  143* 123*  BUN 20  --  20 19  CREATININE 1.09  --  1.35* 1.28*  CALCIUM  9.6  --  8.8* 8.6*  MG  --  2.2  --  2.1  PHOS  --  3.8  --  4.5   Liver Function Tests: Recent Labs  Lab 01/08/24 1424 01/09/24 0529  AST 19 16  ALT 15 15  ALKPHOS 61 54  BILITOT 1.7* 1.7*  PROT 7.5 6.3*  ALBUMIN  3.9 3.1*   CBG: Recent Labs  Lab 01/10/24 1957 01/10/24 2353 01/11/24 0404 01/11/24 0721 01/11/24 1125  GLUCAP 141* 98  120* 119* 169*    Discharge time spent: greater than 30 minutes.  Signed: Sabas GORMAN Brod, MD [facility] 01/11/2024

## 2024-01-11 NOTE — Progress Notes (Signed)
 Progress Note     Subjective: Patient reports no pain during encounter. Has had bowel movements that have been loose. Denies bloody stool. Denies flatulence, nausea, vomiting. Has tolerated previous diet and is requesting solid food.   ROS  All negative with the exception of above  Objective: Vital signs in last 24 hours: Temp:  [97.7 F (36.5 C)-98.4 F (36.9 C)] 98 F (36.7 C) (07/29 1127) Pulse Rate:  [64-83] 64 (07/29 1127) Resp:  [16-18] 18 (07/29 1127) BP: (120-160)/(76-87) 139/76 (07/29 1127) SpO2:  [94 %-99 %] 98 % (07/29 1127) Last BM Date : 01/10/24  Intake/Output from previous day: 07/28 0701 - 07/29 0700 In: 120 [P.O.:120] Out: -  Intake/Output this shift: Total I/O In: 240 [P.O.:240] Out: -   PE: General: Pleasant, WD, male who is laying in bed in NAD. Heart: Regular, rate, and rhythm. Lungs: Respiratory effort nonlabored Abd: Soft, NT, ND. No guarding or rebound tenderness. Psych: A&Ox3 with an appropriate affect.    Lab Results:  Recent Labs    01/08/24 1424 01/09/24 0529  WBC 6.5 3.3*  HGB 14.7 13.2  HCT 44.6 40.1  PLT 194 152   BMET Recent Labs    01/08/24 2243 01/09/24 0529  NA 144 144  K 4.2 3.7  CL 108 111  CO2 26 27  GLUCOSE 143* 123*  BUN 20 19  CREATININE 1.35* 1.28*  CALCIUM  8.8* 8.6*   PT/INR No results for input(s): LABPROT, INR in the last 72 hours. CMP     Component Value Date/Time   NA 144 01/09/2024 0529   NA 143 07/07/2023 1021   K 3.7 01/09/2024 0529   CL 111 01/09/2024 0529   CO2 27 01/09/2024 0529   GLUCOSE 123 (H) 01/09/2024 0529   BUN 19 01/09/2024 0529   BUN 13 07/07/2023 1021   CREATININE 1.28 (H) 01/09/2024 0529   CREATININE 1.27 (H) 05/14/2016 1534   CALCIUM  8.6 (L) 01/09/2024 0529   PROT 6.3 (L) 01/09/2024 0529   PROT 6.8 07/07/2023 1021   ALBUMIN  3.1 (L) 01/09/2024 0529   ALBUMIN  4.2 07/07/2023 1021   AST 16 01/09/2024 0529   ALT 15 01/09/2024 0529   ALKPHOS 54 01/09/2024 0529    BILITOT 1.7 (H) 01/09/2024 0529   BILITOT 0.7 07/07/2023 1021   GFRNONAA 56 (L) 01/09/2024 0529   GFRNONAA 56 (L) 05/14/2016 1534   GFRAA 58 (L) 02/02/2020 1017   GFRAA 64 05/14/2016 1534   Lipase     Component Value Date/Time   LIPASE 22 01/08/2024 1424       Studies/Results: DG Abd Portable 1V-Small Bowel Obstruction Protocol-initial, 8 hr delay Result Date: 01/09/2024 CLINICAL DATA:  8 hour small-bowel follow-up film EXAM: PORTABLE ABDOMEN - 1 VIEW COMPARISON:  Film from earlier in the same day. FINDINGS: Administered contrast now lies throughout the colon without significant retention in the small bowel. Persistent small bowel dilatation is noted. No free air is seen. These changes are consistent with partial small bowel obstruction. IMPRESSION: Contrast within the colon consistent with partial small bowel obstruction. Electronically Signed   By: Oneil Devonshire M.D.   On: 01/09/2024 21:10    Anti-infectives: Anti-infectives (From admission, onward)    None        Assessment/Plan SBO - MR Abdomen WWO Contrast 01/08/2024 showed SBO with possible transition point in the left lower quadrant which may be due to an internal hernia.  -DG Abd 1 View 01/09/2024 showed gaseous distention of small bowel loops in the  mid abdomen measuring up to 5 cm, consistent with small bowel obstruction. Gas is present in the colon. - DG Abd Port 1V SBO Protocol -initial from 01/09/2024 showed contrast within the colon consistent with partial small bowel obstruction. -Albumin  3.1, Total bilirubin 1.7. Both from 7/27 -Pain has improved and having Bms. No nausea or vomiting. Will advance to soft diet -IVF per [facility]     FEN: Advance to soft diet; IVF per TRH (LR infusion 2 75 mL/hr) VTE: SCDs; Okay for DVT ppx from surgical perspective ID: None    LOS: 3 days   I reviewed hospitalist notes, last 24 h vitals and pain scores, last 48 h intake and output, last 24 h labs and trends, and last 24 h imaging  results.  This care required moderate level of medical decision making.    Marjorie Carlyon Favre, [facility] Surgery 01/11/2024, 12:27 PM Please see Amion for pager number during day hours 7:00am-4:30pm

## 2024-01-12 ENCOUNTER — Telehealth: Payer: Self-pay

## 2024-01-12 NOTE — Transitions of Care (Post Inpatient/ED Visit) (Signed)
   01/12/2024  Name: Edward Newton MRN: [id number] DOB: January 05, 1941  Today's TOC FU Call Status: Today's TOC FU Call Status:: Successful TOC FU Call Completed TOC FU Call Complete Date: 01/12/24 Patient's Name and Date of Birth confirmed.  Transition Care Management Follow-up Telephone Call Date of Discharge: 01/11/24 Discharge Facility: [facility]) Type of Discharge: Inpatient Admission Primary Inpatient Discharge Diagnosis:: intestinal adhesions How have you been since you were released from the hospital?: Better Any questions or concerns?: No  Items Reviewed: Did you receive and understand the discharge instructions provided?: Yes Medications obtained,verified, and reconciled?: Yes (Medications Reviewed) Any new allergies since your discharge?: No Dietary orders reviewed?: Yes Do you have support at home?: Yes People in Home [RPT]: spouse  Medications Reviewed Today: Medications Reviewed Today     Reviewed by Emmitt Pan, LPN (Licensed Practical Nurse) on 01/12/24 at [phone]  Med List Status: <None>   Medication Order Taking? Sig Documenting Provider Last Dose Status Informant  atorvastatin  (LIPITOR) 40 MG tablet [id number] Yes Take 1 tablet (40 mg total) by mouth daily. Howell Lunger, DO  Active Self  Blood Glucose Monitoring Suppl (ONETOUCH VERIO FLEX SYSTEM) w/Device KIT [id number] Yes Use as directed to check blood sugar Tharon Lung, MD  Active   fluticasone  (FLONASE ) 50 MCG/ACT nasal spray [id number] Yes Place 2 sprays into both nostrils daily. Howell Lunger, DO  Active Self  glucose blood (ONETOUCH VERIO) test strip [id number] Yes Check blood sugar once daily Tharon Lung, MD  Active   hydrochlorothiazide  (HYDRODIURIL ) 12.5 MG tablet [id number] Yes Take 1 tablet (12.5 mg total) by mouth daily. Tharon Lung, MD  Active Self  metFORMIN  (GLUCOPHAGE -XR) 500 MG 24 hr tablet [id number] Yes Take 1 tablet (500 mg total) by mouth daily with breakfast.  Patient taking  differently: Take 500 mg by mouth daily as needed (if BGL is elevated).   Tharon Lung, MD  Active Self  methocarbamol  (ROBAXIN -750) 750 MG tablet [id number]  Take 1 tablet (750 mg total) by mouth every 6 (six) hours as needed for muscle spasms.  Patient not taking: Reported on 01/12/2024   Stechschulte, Deward PARAS, MD  Active Self  OneTouch Delica Lancets 33G MISC [id number] Yes Check blood sugar once daily Alba Sharper, MD  Active   tamsulosin  (FLOMAX ) 0.4 MG CAPS capsule [id number] Yes Take 1 capsule (0.4 mg total) by mouth every evening.  Patient taking differently: Take 0.4 mg by mouth at bedtime.   Macario Dorothyann HERO, MD  Active Self            Home Care and Equipment/Supplies: Were Home Health Services Ordered?: NA Any new equipment or medical supplies ordered?: NA  Functional Questionnaire: Do you need assistance with bathing/showering or dressing?: No Do you need assistance with meal preparation?: No Do you need assistance with eating?: No Do you have difficulty maintaining continence: No Do you need assistance with getting out of bed/getting out of a chair/moving?: No Do you have difficulty managing or taking your medications?: No  Follow up appointments reviewed: PCP Follow-up appointment confirmed?: No (sent message to staff to schedule) MD Provider Line [phone] Given: No Specialist Hospital Follow-up appointment confirmed?: NA Do you need transportation to your follow-up appointment?: No Do you understand care options if your condition(s) worsen?: Yes-patient verbalized understanding    SIGNATURE Pan Emmitt, LPN [facility] Nurse Health Advisor Direct Dial [phone]

## 2024-01-27 ENCOUNTER — Ambulatory Visit (INDEPENDENT_AMBULATORY_CARE_PROVIDER_SITE_OTHER): Admitting: Family Medicine

## 2024-01-27 ENCOUNTER — Encounter: Payer: Self-pay | Admitting: Family Medicine

## 2024-01-27 VITALS — BP 123/83 | HR 72 | Ht 70.0 in | Wt 150.4 lb

## 2024-01-27 DIAGNOSIS — E119 Type 2 diabetes mellitus without complications: Secondary | ICD-10-CM

## 2024-01-27 DIAGNOSIS — K869 Disease of pancreas, unspecified: Secondary | ICD-10-CM | POA: Diagnosis not present

## 2024-01-27 DIAGNOSIS — I159 Secondary hypertension, unspecified: Secondary | ICD-10-CM | POA: Diagnosis not present

## 2024-01-27 DIAGNOSIS — N281 Cyst of kidney, acquired: Secondary | ICD-10-CM | POA: Diagnosis not present

## 2024-01-27 DIAGNOSIS — E782 Mixed hyperlipidemia: Secondary | ICD-10-CM | POA: Diagnosis not present

## 2024-01-27 DIAGNOSIS — E279 Disorder of adrenal gland, unspecified: Secondary | ICD-10-CM

## 2024-01-27 DIAGNOSIS — K56609 Unspecified intestinal obstruction, unspecified as to partial versus complete obstruction: Secondary | ICD-10-CM

## 2024-01-27 DIAGNOSIS — E876 Hypokalemia: Secondary | ICD-10-CM | POA: Diagnosis not present

## 2024-01-27 NOTE — Assessment & Plan Note (Signed)
 Adrenal and renal cysts benign. No follow up needed. He should continue to have his pancreatic lesion followed in 2 years (around 2027).

## 2024-01-27 NOTE — Progress Notes (Signed)
    SUBJECTIVE:   CHIEF COMPLAINT / HPI:   Hospital follow up for partial SBO Found on MRI for surveillance of pancreatic lesion. Patient experienced nausea, vomiting. He has hernia repair on 08/23/23 and previous SBO which likely predisposed to this. He was treated conservatively with clear liquid diet and was able to advance to soft diet by discharge. He has no pain today. Stooling normally. PO normally.  HTN, DM Controlled while in the hospital. He did have a slight bump in Cr and slightly low K likely due to restricted intake. He has not been taking his metformin  because he saw a CBG of 78. Since being off metformin , he has saw CBG values in low 100s.  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 Benign adenoma and benign renal cyst again noted without recommendations for follow up.  OBJECTIVE:   BP 123/83   Pulse 72   Ht 5' 10 (1.778 m)   Wt 150 lb 6.4 oz (68.2 kg)   SpO2 100%   BMI 21.58 kg/m   General: Alert and oriented, in NAD Skin: Warm, dry, and intact without lesions HEENT: NCAT, EOM grossly normal, midline nasal septum Cardiac: RRR, no m/r/g appreciated Respiratory: CTAB, breathing and speaking comfortably on RA Abdominal: Soft, nontender, nondistended, normoactive bowel sounds Extremities: Moves all extremities grossly equally Neurological: No gross focal deficit Psychiatric: Appropriate mood and affect   ASSESSMENT/PLAN:   Assessment & Plan Small bowel obstruction (HCC) s/p SB resection Recurrent, now resolved. In the setting of adhesions.  Continue close follow-up for symptoms of recurrence. Controlled type 2 diabetes mellitus without complication, without long-term current use of insulin  (HCC) Mixed hyperlipidemia Controlled at recent A1c 6.5. Lipids controlled. After shared-decision making, because patient is at goal, we will hold off on  further metformin  for now and recheck A1c in 2 months. Hypokalemia Secondary hypertension BP controlled today. Recheck BMP for Cr and K.  Continue HCTZ. Pancreatic lesion Renal cyst Adrenal nodule (HCC) Adrenal and renal cysts benign. No follow up needed. He should continue to have his pancreatic lesion followed in 2 years (around 2027).   Stuart Redo, MD [facility]

## 2024-01-27 NOTE — Assessment & Plan Note (Addendum)
 Controlled at recent A1c 6.5. Lipids controlled. After shared-decision making, because patient is at goal, we will hold off on further metformin  for now and recheck A1c in 2 months.

## 2024-01-27 NOTE — Assessment & Plan Note (Addendum)
 BP controlled today. Recheck BMP for Cr and K.  Continue HCTZ.

## 2024-01-27 NOTE — Patient Instructions (Signed)
 Come back in 2 months for blood sugar check.  We obtained a BMP today, and I will send you a message with results.

## 2024-01-27 NOTE — Assessment & Plan Note (Addendum)
 Recurrent, now resolved. In the setting of adhesions.  Continue close follow-up for symptoms of recurrence.

## 2024-02-01 ENCOUNTER — Other Ambulatory Visit: Payer: Self-pay | Admitting: Family Medicine

## 2024-02-01 DIAGNOSIS — I159 Secondary hypertension, unspecified: Secondary | ICD-10-CM

## 2024-02-01 DIAGNOSIS — E119 Type 2 diabetes mellitus without complications: Secondary | ICD-10-CM

## 2024-02-03 ENCOUNTER — Other Ambulatory Visit: Payer: Self-pay | Admitting: Family Medicine

## 2024-02-03 DIAGNOSIS — I159 Secondary hypertension, unspecified: Secondary | ICD-10-CM

## 2024-02-29 ENCOUNTER — Other Ambulatory Visit: Payer: Self-pay | Admitting: Family Medicine

## 2024-02-29 DIAGNOSIS — E119 Type 2 diabetes mellitus without complications: Secondary | ICD-10-CM

## 2024-04-11 ENCOUNTER — Other Ambulatory Visit: Payer: Self-pay | Admitting: Student

## 2024-04-11 DIAGNOSIS — H6992 Unspecified Eustachian tube disorder, left ear: Secondary | ICD-10-CM

## 2024-05-05 ENCOUNTER — Telehealth: Payer: Self-pay | Admitting: Pharmacist

## 2024-05-05 NOTE — Telephone Encounter (Signed)
 Patient contacted for follow-up of adherence QI in regards to adherence with Metformin   Since last contact patient reports that he has not been taking Metformin  due to GI side effects and feels that sugar has been fine without it.  Apt scheduled with Dr. Tharon on 06/12/24  Total time with patient call and documentation of interaction: 8 minutes.

## 2024-05-05 NOTE — Telephone Encounter (Signed)
 Reviewed and agree with Dr Rennis plan.

## 2024-06-12 ENCOUNTER — Ambulatory Visit: Admitting: Family Medicine

## 2024-06-12 ENCOUNTER — Encounter: Payer: Self-pay | Admitting: Family Medicine

## 2024-06-12 ENCOUNTER — Ambulatory Visit
Admission: RE | Admit: 2024-06-12 | Discharge: 2024-06-12 | Disposition: A | Source: Ambulatory Visit | Attending: Family Medicine

## 2024-06-12 VITALS — BP 125/78 | HR 88 | Ht 70.0 in | Wt 150.8 lb

## 2024-06-12 DIAGNOSIS — K869 Disease of pancreas, unspecified: Secondary | ICD-10-CM

## 2024-06-12 DIAGNOSIS — E119 Type 2 diabetes mellitus without complications: Secondary | ICD-10-CM | POA: Diagnosis not present

## 2024-06-12 DIAGNOSIS — M7542 Impingement syndrome of left shoulder: Secondary | ICD-10-CM

## 2024-06-12 DIAGNOSIS — M159 Polyosteoarthritis, unspecified: Secondary | ICD-10-CM

## 2024-06-12 LAB — POCT GLYCOSYLATED HEMOGLOBIN (HGB A1C): HbA1c, POC (controlled diabetic range): 6.2 % (ref 0.0–7.0)

## 2024-06-12 MED ORDER — MELOXICAM 7.5 MG PO TABS: 7.5000 mg | ORAL_TABLET | Freq: Every day | ORAL | 0 refills | Status: AC

## 2024-06-12 NOTE — Assessment & Plan Note (Addendum)
 Diet-controlled. Discontinue metformin  since he has not been taking it. Follow up in 6 months or sooner if needed. Advised to see eye doctor for diabetic eye exam. Foot exam reassuring.

## 2024-06-12 NOTE — Assessment & Plan Note (Signed)
 Bilirubin 1.7 on prior CMP. 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 No abdominal pain today. - Repeat CMP as above. - F/u MRI abdomen around July 2027.

## 2024-06-12 NOTE — Addendum Note (Signed)
 Addended by: THARON LUNG B on: 06/12/2024 01:40 PM   Modules accepted: Orders

## 2024-06-12 NOTE — Progress Notes (Signed)
" ° °  SUBJECTIVE:   CHIEF COMPLAINT / HPI:  Discussed the use of AI scribe software for clinical note transcription with the patient, who gave verbal consent to proceed.  History of Present Illness Edward Newton is an 83 year old male who presents with left shoulder pain.  Left shoulder pain - Sudden onset of severe left anterior shoulder pain 2 weeks ago, beginning overnight - Pain radiates intermittently to the trapezius - Pain intensity sometimes prevents lifting the arm or performing usual tasks - Persistent pain with some improvement when keeping the shoulder moving - Occasional use of OTC pain medication when pain is intense - No prior episodes of similar shoulder pain - No prior shoulder imaging - Difficulty with certain activities due to pain and weakness mostly from pain  Diabetes mellitus management - Not taking prescribed metformin  or Jardiance regularly - Feels blood sugar is stable  PERTINENT  PMH / PSH: generalized OA of multiple sites  OBJECTIVE:  BP 125/78   Pulse 88   Ht 5' 10 (1.778 m)   Wt 150 lb 12.8 oz (68.4 kg)   SpO2 96%   BMI 21.64 kg/m   Physical Exam GENERAL: Alert, cooperative, well developed, no acute distress. MUSCULOSKELETAL: Tenderness at anterior left shoulder and along left trapezius, pain on movement of left shoulder worsening around 90 degrees though able to be passively movement fully, strength intact in LUE, grip strength intact, positive Hawkins and empty can testing NEUROLOGICAL: Cranial nerves grossly intact, moves all other extremities without gross motor or sensory deficit.  ASSESSMENT/PLAN:   Assessment & Plan Shoulder impingement syndrome, left Left shoulder pain with anterior tenderness and weakness with positive special tests. No x-rays available. - Ordered left shoulder x-ray given age and acute onset. - Referred to physical therapy. - Prescribed meloxicam  7.5 mg daily for 7 days, lower dose for age. - Gave home exercises  while awaiting PT. Pancreatic lesion Hyperbilirubinemia Bilirubin 1.7 on prior CMP. 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 No abdominal pain today. - Repeat CMP as above. - F/u MRI abdomen around July 2027. Type 2 diabetes mellitus, controlled (HCC) Diet-controlled. Discontinue metformin  since he has not been taking it. Follow up in 6 months or sooner if needed. Advised to see eye doctor for diabetic eye exam. Foot exam reassuring.  Stuart Redo, MD [facility]  "

## 2024-06-12 NOTE — Patient Instructions (Addendum)
 VISIT SUMMARY: Today, you were seen for left shoulder pain and left arm weakness. We also discussed your diabetes management and a stable pancreatic cystic lesion.  YOUR PLAN: LEFT SHOULDER OSTEOARTHRITIS AND PAIN: You have pain and tenderness in your left shoulder, likely due to osteoarthritis or bursitis. -We have ordered an x-ray of your left shoulder, go to [facility] across the street today: [address]. -You are referred to physical therapy to help with your shoulder pain and function. -You are prescribed meloxicam  7.5 mg daily for 7 days to help with the pain. -We have ordered a basic metabolic panel to check your kidney function.  TYPE 2 DIABETES MELLITUS, CONTROLLED: Your type 2 diabetes is currently controlled without medication, and your blood sugar levels are within the normal range. -We have ordered a basic metabolic panel to monitor your condition. -STOP metformin  and continue the excellent work.  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 -Continue with follow-up MRI in two years to monitor the lesion.  Please let me know if you have any other questions.  Dr. Tharon

## 2024-06-13 ENCOUNTER — Ambulatory Visit: Payer: Self-pay | Admitting: Family Medicine

## 2024-06-13 LAB — COMPREHENSIVE METABOLIC PANEL WITH GFR
ALT: 12 IU/L (ref 0–44)
AST: 14 IU/L (ref 0–40)
Albumin: 3.9 g/dL (ref 3.7–4.7)
Alkaline Phosphatase: 72 IU/L (ref 48–129)
BUN/Creatinine Ratio: 13 (ref 10–24)
BUN: 16 mg/dL (ref 8–27)
Bilirubin Total: 0.9 mg/dL (ref 0.0–1.2)
CO2: 24 mmol/L (ref 20–29)
Calcium: 9.4 mg/dL (ref 8.6–10.2)
Chloride: 107 mmol/L — ABNORMAL HIGH (ref 96–106)
Creatinine, Ser: 1.26 mg/dL (ref 0.76–1.27)
Globulin, Total: 2.4 g/dL (ref 1.5–4.5)
Glucose: 102 mg/dL — ABNORMAL HIGH (ref 70–99)
Potassium: 4 mmol/L (ref 3.5–5.2)
Sodium: 145 mmol/L — ABNORMAL HIGH (ref 134–144)
Total Protein: 6.3 g/dL (ref 6.0–8.5)
eGFR: 57 mL/min/1.73 — ABNORMAL LOW

## 2024-06-14 LAB — BILIRUBIN, FRACTIONATED(TOT/DIR/INDIR)
Bilirubin Total: 0.7 mg/dL (ref 0.0–1.2)
Bilirubin, Direct: 0.24 mg/dL (ref 0.00–0.40)
Bilirubin, Indirect: 0.46 mg/dL (ref 0.10–0.80)

## 2024-06-14 LAB — SPECIMEN STATUS REPORT

## 2024-06-19 DIAGNOSIS — H6992 Unspecified Eustachian tube disorder, left ear: Secondary | ICD-10-CM

## 2024-06-19 DIAGNOSIS — I159 Secondary hypertension, unspecified: Secondary | ICD-10-CM

## 2024-06-19 MED ORDER — ATORVASTATIN CALCIUM 40 MG PO TABS: 40.0000 mg | ORAL_TABLET | Freq: Every day | ORAL | 3 refills | Status: DC

## 2024-06-19 MED ORDER — HYDROCHLOROTHIAZIDE 12.5 MG PO TABS: 12.5000 mg | ORAL_TABLET | Freq: Every day | ORAL | 3 refills | Status: AC

## 2024-06-19 MED ORDER — FLUTICASONE PROPIONATE 50 MCG/ACT NA SUSP: 2.0000 | Freq: Every day | NASAL | 2 refills | Status: AC

## 2024-06-19 NOTE — Telephone Encounter (Signed)
 Patient calls nurse line requesting that prescriptions be sent to Mail order pharmacy through insurance company.   Requesting that all prescriptions be sent to [facility].   Pended medications to encounter.   Chiquita JAYSON English, RN
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
## 2024-06-22 ENCOUNTER — Other Ambulatory Visit: Payer: Self-pay

## 2024-06-22 ENCOUNTER — Ambulatory Visit: Payer: Medicare (Managed Care)

## 2024-06-22 DIAGNOSIS — M25612 Stiffness of left shoulder, not elsewhere classified: Secondary | ICD-10-CM

## 2024-06-22 DIAGNOSIS — M25512 Pain in left shoulder: Secondary | ICD-10-CM
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
## 2024-06-28 ENCOUNTER — Ambulatory Visit: Payer: Medicare (Managed Care)

## 2024-06-28 DIAGNOSIS — M25512 Pain in left shoulder: Secondary | ICD-10-CM

## 2024-06-28 DIAGNOSIS — M25612 Stiffness of left shoulder, not elsewhere classified: Secondary | ICD-10-CM | POA: Diagnosis not present

## 2024-07-03 ENCOUNTER — Encounter: Payer: Self-pay | Admitting: Pharmacist

## 2024-07-03 ENCOUNTER — Other Ambulatory Visit: Payer: Self-pay

## 2024-07-03 MED ORDER — TAMSULOSIN HCL 0.4 MG PO CAPS: 0.4000 mg | ORAL_CAPSULE | Freq: Every evening | ORAL | 3 refills | Status: DC

## 2024-07-03 NOTE — Progress Notes (Signed)
 This patient is appearing on a report for being at risk of failing the adherence measure for diabetes medications this calendar year.   Medication: metformin  Last fill date: 06/03/24 for 90 day supply  Reviewed medication indication, dosing, and goals of therapy.

## 2024-07-04 ENCOUNTER — Ambulatory Visit: Payer: Medicare (Managed Care) | Admitting: Family Medicine

## 2024-07-04 ENCOUNTER — Ambulatory Visit: Payer: Self-pay | Admitting: Family Medicine

## 2024-07-04 ENCOUNTER — Encounter: Payer: Self-pay | Admitting: Family Medicine

## 2024-07-04 VITALS — BP 120/60 | HR 80 | Ht 70.0 in | Wt 156.8 lb

## 2024-07-04 DIAGNOSIS — M19012 Primary osteoarthritis, left shoulder: Secondary | ICD-10-CM | POA: Insufficient documentation

## 2024-07-04 LAB — OPHTHALMOLOGY REPORT-SCANNED

## 2024-07-04 MED ORDER — TAMSULOSIN HCL 0.4 MG PO CAPS: 0.4000 mg | ORAL_CAPSULE | Freq: Every evening | ORAL | 3 refills | Status: AC

## 2024-07-04 MED ORDER — METHYLPREDNISOLONE ACETATE 40 MG/ML IJ SUSP
40.0000 mg | Freq: Once | INTRAMUSCULAR | Status: AC
  Administered 2024-07-04: 40 mg via INTRAMUSCULAR

## 2024-07-04 NOTE — Progress Notes (Signed)
" ° °  SUBJECTIVE:   CHIEF COMPLAINT / HPI:  Discussed the use of AI scribe software for clinical note transcription with the patient, who gave verbal consent to proceed.  History of Present Illness Edward Newton is an 84 year old male who presents with shoulder pain.  Left shoulder pain continues - Pain worsens with certain movements such as abduction past 30 degrees - Able to use the arm despite pain most days  - Participating in physical therapy since last visit with unclear benefit - Is interested in steroid shot if indicated   PERTINENT  PMH / PSH: A1c 6.2  OBJECTIVE:  BP 120/60   Pulse 80   Ht 5' 10 (1.778 m)   Wt 156 lb 12.8 oz (71.1 kg)   SpO2 97%   BMI 22.50 kg/m   Physical Exam GENERAL: Alert, cooperative, well developed, no acute distress. MUSCULOSKELETAL: Left shoulder with TTP below the level of posterior acromion, ROM limited past 30 degrees due to pain NEUROLOGICAL: Cranial nerves grossly intact, moves all extremities without gross motor or sensory deficit.  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 Appropriate time out was taken.  Left posterior shoulder was prepped and draped in usual sterile fashion. Ethyl chloride was used for local anesthesia. A 21 gauge 1 1/2 inch needle was used. 1 cc of methylprednisolone  40 mg/ml plus 4 cc of 1% lidocaine  without epinephrine  was injected into the subacromial space using a posterior approach. The patient tolerated the procedure well. There were no complications. Pain improved and ROM improved past 30 degrees after injection. Post procedure instructions were given. He was instructed to continue with PT.  Stuart Redo, MD [facility]  "

## 2024-07-04 NOTE — Patient Instructions (Addendum)
 We completed an injection today of the shoulder. I have given more information about this. Let me know how this works over time.
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
## 2024-07-04 NOTE — Assessment & Plan Note (Signed)
 PROCEDURE NOTE: Patient was given informed consent, signed copy in the chart. Appropriate time out was taken.  Left posterior shoulder was prepped and draped in usual sterile fashion. Ethyl chloride was used for local anesthesia. A 21 gauge 1 1/2 inch needle was used. 1 cc of methylprednisolone  40 mg/ml plus 4 cc of 1% lidocaine  without epinephrine  was injected into the subacromial space using a posterior approach. The patient tolerated the procedure well. There were no complications. Pain improved and ROM improved past 30 degrees after injection. Post procedure instructions were given. He was instructed to continue with PT.

## 2024-07-05 ENCOUNTER — Ambulatory Visit: Payer: Medicare (Managed Care)

## 2024-07-05 DIAGNOSIS — M25512 Pain in left shoulder: Secondary | ICD-10-CM | POA: Diagnosis not present

## 2024-07-05 DIAGNOSIS — M25612 Stiffness of left shoulder, not elsewhere classified: Secondary | ICD-10-CM | POA: Diagnosis not present
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
## 2024-07-13 ENCOUNTER — Telehealth: Payer: Self-pay

## 2024-07-13 NOTE — Telephone Encounter (Signed)
 Called [facility] about missed appt and told patient there may be openings this week if he wanted to call back and gave info about next scheduled appt.
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
## 2024-07-21 ENCOUNTER — Other Ambulatory Visit: Payer: Self-pay | Admitting: Student

## 2024-09-18 ENCOUNTER — Encounter
